# Patient Record
Sex: Female | Born: 1935 | Race: White | Hispanic: No | Smoking: Never smoker
Health system: Southern US, Community
[De-identification: ages and names within clinical notes are randomized; demographics above are authoritative.]

## PROBLEM LIST (undated history)

## (undated) DIAGNOSIS — I739 Peripheral vascular disease, unspecified: Secondary | ICD-10-CM

## (undated) DIAGNOSIS — G629 Polyneuropathy, unspecified: Secondary | ICD-10-CM

## (undated) DIAGNOSIS — I209 Angina pectoris, unspecified: Secondary | ICD-10-CM

## (undated) DIAGNOSIS — M259 Joint disorder, unspecified: Secondary | ICD-10-CM

## (undated) DIAGNOSIS — K219 Gastro-esophageal reflux disease without esophagitis: Secondary | ICD-10-CM

## (undated) DIAGNOSIS — E119 Type 2 diabetes mellitus without complications: Secondary | ICD-10-CM

## (undated) DIAGNOSIS — I2510 Atherosclerotic heart disease of native coronary artery without angina pectoris: Secondary | ICD-10-CM

## (undated) DIAGNOSIS — T4145XA Adverse effect of unspecified anesthetic, initial encounter: Secondary | ICD-10-CM

## (undated) DIAGNOSIS — J45909 Unspecified asthma, uncomplicated: Secondary | ICD-10-CM

## (undated) DIAGNOSIS — G4730 Sleep apnea, unspecified: Secondary | ICD-10-CM

## (undated) DIAGNOSIS — J309 Allergic rhinitis, unspecified: Secondary | ICD-10-CM

## (undated) DIAGNOSIS — Q639 Congenital malformation of kidney, unspecified: Secondary | ICD-10-CM

## (undated) DIAGNOSIS — I10 Essential (primary) hypertension: Secondary | ICD-10-CM

## (undated) DIAGNOSIS — E538 Deficiency of other specified B group vitamins: Secondary | ICD-10-CM

## (undated) DIAGNOSIS — C50919 Malignant neoplasm of unspecified site of unspecified female breast: Secondary | ICD-10-CM

## (undated) DIAGNOSIS — F329 Major depressive disorder, single episode, unspecified: Secondary | ICD-10-CM

## (undated) DIAGNOSIS — M1990 Unspecified osteoarthritis, unspecified site: Secondary | ICD-10-CM

## (undated) DIAGNOSIS — T8859XA Other complications of anesthesia, initial encounter: Secondary | ICD-10-CM

## (undated) DIAGNOSIS — H9190 Unspecified hearing loss, unspecified ear: Secondary | ICD-10-CM

## (undated) DIAGNOSIS — F32A Depression, unspecified: Secondary | ICD-10-CM

## (undated) DIAGNOSIS — I219 Acute myocardial infarction, unspecified: Secondary | ICD-10-CM

## (undated) DIAGNOSIS — R609 Edema, unspecified: Secondary | ICD-10-CM

## (undated) DIAGNOSIS — M539 Dorsopathy, unspecified: Secondary | ICD-10-CM

## (undated) HISTORY — PX: CORONARY ARTERY BYPASS GRAFT: SHX141

## (undated) HISTORY — PX: TOTAL KNEE ARTHROPLASTY: SHX125

## (undated) HISTORY — PX: CORONARY ANGIOPLASTY: SHX604

## (undated) HISTORY — PX: CHOLECYSTECTOMY: SHX55

## (undated) HISTORY — PX: APPENDECTOMY: SHX54

---

## 2007-10-29 ENCOUNTER — Encounter: Payer: Self-pay | Admitting: Orthopedic Surgery

## 2007-11-14 ENCOUNTER — Encounter: Payer: Self-pay | Admitting: Orthopedic Surgery

## 2008-12-08 ENCOUNTER — Encounter: Payer: Self-pay | Admitting: Orthopedic Surgery

## 2008-12-14 ENCOUNTER — Encounter: Payer: Self-pay | Admitting: Orthopedic Surgery

## 2009-01-14 ENCOUNTER — Encounter: Payer: Self-pay | Admitting: Orthopedic Surgery

## 2009-02-13 ENCOUNTER — Encounter: Payer: Self-pay | Admitting: Orthopedic Surgery

## 2009-06-22 ENCOUNTER — Ambulatory Visit: Payer: Self-pay | Admitting: Internal Medicine

## 2009-12-02 ENCOUNTER — Encounter: Payer: Self-pay | Admitting: Orthopedic Surgery

## 2009-12-14 ENCOUNTER — Encounter: Payer: Self-pay | Admitting: Orthopedic Surgery

## 2010-08-03 ENCOUNTER — Ambulatory Visit: Payer: Self-pay | Admitting: Family Medicine

## 2010-09-13 ENCOUNTER — Encounter: Payer: Self-pay | Admitting: Orthopedic Surgery

## 2010-09-14 ENCOUNTER — Encounter: Payer: Self-pay | Admitting: Orthopedic Surgery

## 2010-10-15 ENCOUNTER — Encounter: Payer: Self-pay | Admitting: Orthopedic Surgery

## 2010-10-21 ENCOUNTER — Ambulatory Visit: Payer: Self-pay | Admitting: Family Medicine

## 2012-09-14 ENCOUNTER — Ambulatory Visit: Payer: Self-pay | Admitting: Family Medicine

## 2012-09-28 ENCOUNTER — Ambulatory Visit: Payer: Self-pay | Admitting: Family Medicine

## 2012-10-15 ENCOUNTER — Ambulatory Visit: Payer: Self-pay | Admitting: Family

## 2013-04-18 ENCOUNTER — Emergency Department: Payer: Self-pay | Admitting: Emergency Medicine

## 2013-04-18 LAB — COMPREHENSIVE METABOLIC PANEL
Albumin: 3.5 g/dL (ref 3.4–5.0)
Alkaline Phosphatase: 114 U/L
Anion Gap: 6 — ABNORMAL LOW (ref 7–16)
BUN: 13 mg/dL (ref 7–18)
Calcium, Total: 9.3 mg/dL (ref 8.5–10.1)
Creatinine: 0.67 mg/dL (ref 0.60–1.30)
Glucose: 101 mg/dL — ABNORMAL HIGH (ref 65–99)
SGPT (ALT): 20 U/L (ref 12–78)
Sodium: 137 mmol/L (ref 136–145)

## 2013-04-18 LAB — URINALYSIS, COMPLETE
Blood: NEGATIVE
Specific Gravity: 1.012 (ref 1.003–1.030)
Squamous Epithelial: NONE SEEN

## 2013-04-18 LAB — CBC WITH DIFFERENTIAL/PLATELET
Basophil #: 0 10*3/uL (ref 0.0–0.1)
Basophil %: 0.6 %
Eosinophil %: 1 %
HCT: 37.4 % (ref 35.0–47.0)
Lymphocyte %: 19.2 %
MCH: 31.6 pg (ref 26.0–34.0)
MCHC: 34.2 g/dL (ref 32.0–36.0)
Monocyte #: 0.5 x10 3/mm (ref 0.2–0.9)
Monocyte %: 6.9 %
Neutrophil %: 72.3 %

## 2013-04-18 LAB — TROPONIN I: Troponin-I: <0.02 ng/mL

## 2013-04-18 LAB — CK TOTAL AND CKMB (NOT AT ARMC): CK-MB: 3.2 ng/mL (ref 0.5–3.6)

## 2013-04-23 LAB — CULTURE, BLOOD (SINGLE)

## 2013-06-10 ENCOUNTER — Ambulatory Visit: Payer: Self-pay | Admitting: Gastroenterology

## 2013-06-10 ENCOUNTER — Emergency Department: Payer: Self-pay | Admitting: Emergency Medicine

## 2013-06-10 LAB — CBC WITH DIFFERENTIAL/PLATELET
Basophil #: 0.1 10*3/uL (ref 0.0–0.1)
Basophil %: 0.7 %
Eosinophil #: 0.1 10*3/uL (ref 0.0–0.7)
Eosinophil %: 1.3 %
HCT: 40.2 % (ref 35.0–47.0)
HGB: 13.2 g/dL (ref 12.0–16.0)
LYMPHS ABS: 1.7 10*3/uL (ref 1.0–3.6)
Lymphocyte %: 19.6 %
MCH: 31.1 pg (ref 26.0–34.0)
MCHC: 32.9 g/dL (ref 32.0–36.0)
MCV: 94 fL (ref 80–100)
MONO ABS: 0.7 x10 3/mm (ref 0.2–0.9)
MONOS PCT: 8.2 %
Neutrophil #: 5.9 10*3/uL (ref 1.4–6.5)
Neutrophil %: 70.2 %
Platelet: 248 10*3/uL (ref 150–440)
RBC: 4.26 10*6/uL (ref 3.80–5.20)
RDW: 13 % (ref 11.5–14.5)
WBC: 8.4 10*3/uL (ref 3.6–11.0)

## 2013-06-10 LAB — BASIC METABOLIC PANEL
Anion Gap: 8 (ref 7–16)
BUN: 10 mg/dL (ref 7–18)
CHLORIDE: 102 mmol/L (ref 98–107)
Calcium, Total: 9.6 mg/dL (ref 8.5–10.1)
Co2: 27 mmol/L (ref 21–32)
Creatinine: 0.63 mg/dL (ref 0.60–1.30)
Glucose: 116 mg/dL — ABNORMAL HIGH (ref 65–99)
Osmolality: 274 (ref 275–301)
Potassium: 3.7 mmol/L (ref 3.5–5.1)
Sodium: 137 mmol/L (ref 136–145)

## 2013-06-10 LAB — TROPONIN I: Troponin-I: <0.02 ng/mL

## 2013-06-11 LAB — PATHOLOGY REPORT

## 2013-11-05 ENCOUNTER — Ambulatory Visit: Payer: Self-pay | Admitting: Family

## 2014-12-28 IMAGING — US US EXTREM LOW VENOUS BILAT
1 series · 14 of 24 positions shown · non-contrast
Comparison: none

REASON FOR EXAM: Bila Leg Pain Eval DVT
COMMENTS:

[Series 1: us extrem low venous bilat · 0.09mm/px · 14 of 43 slices shown]
[im 1/43]
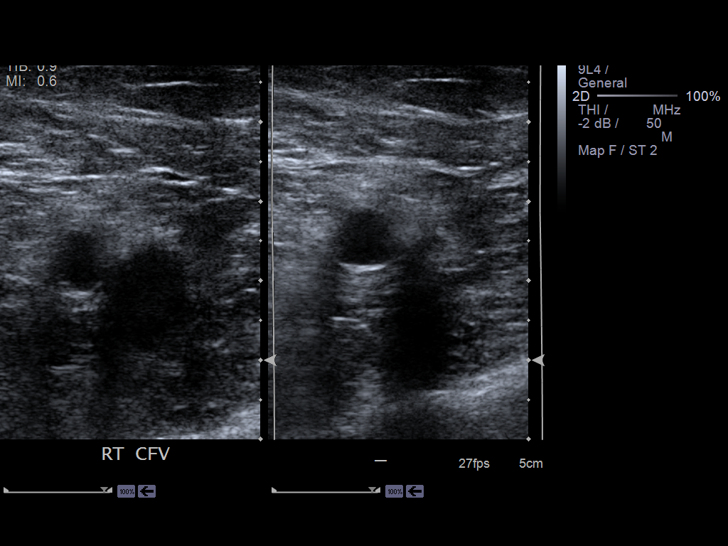
[im 4/43]
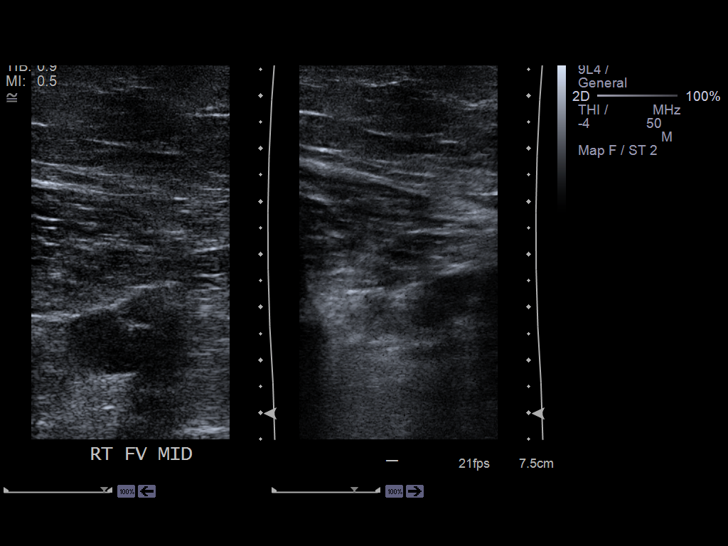
[im 8/43]
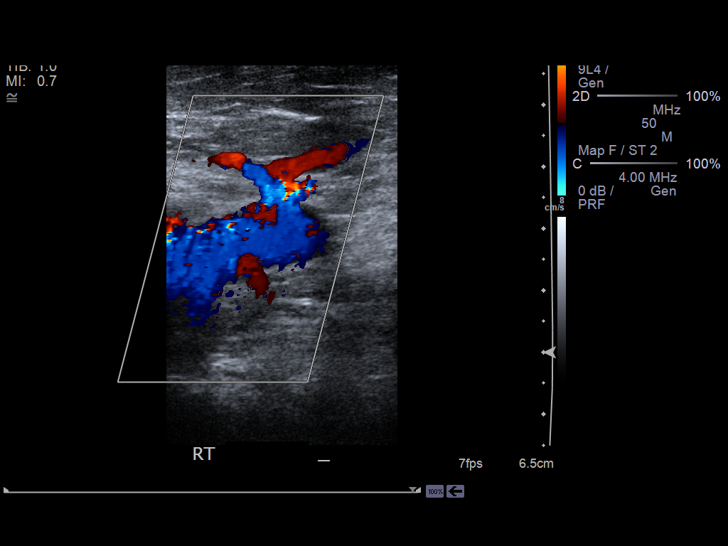
[im 11/43]
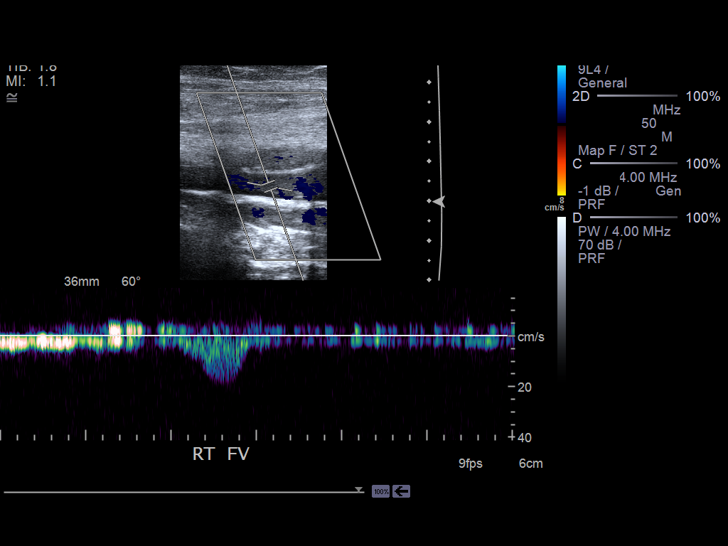
[im 13/43]
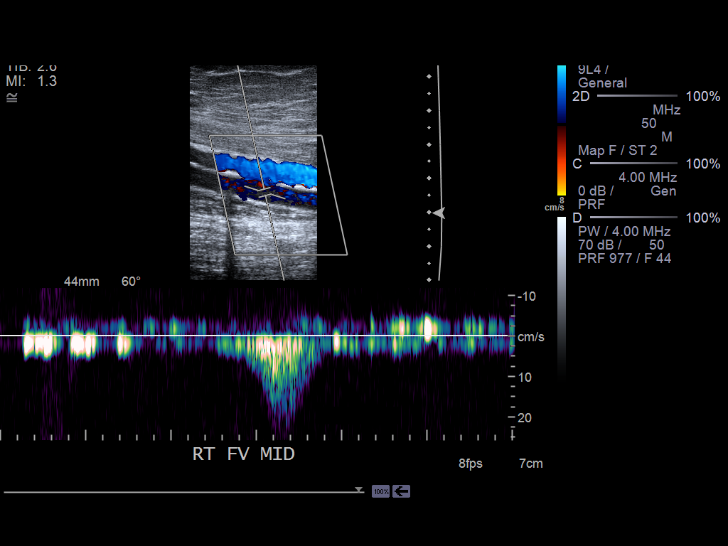
[im 17/43]
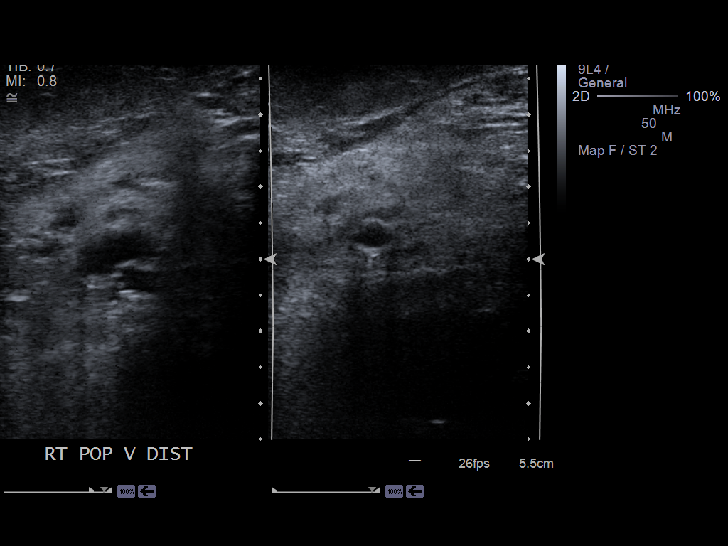
[im 21/43]
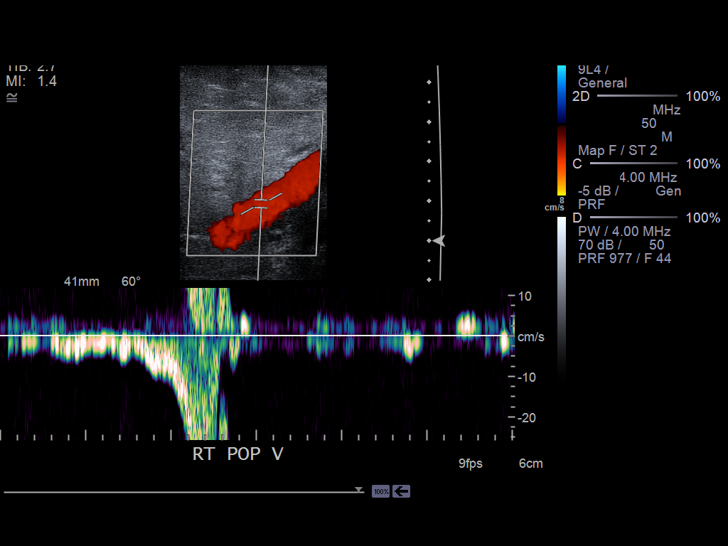
[im 22/43]
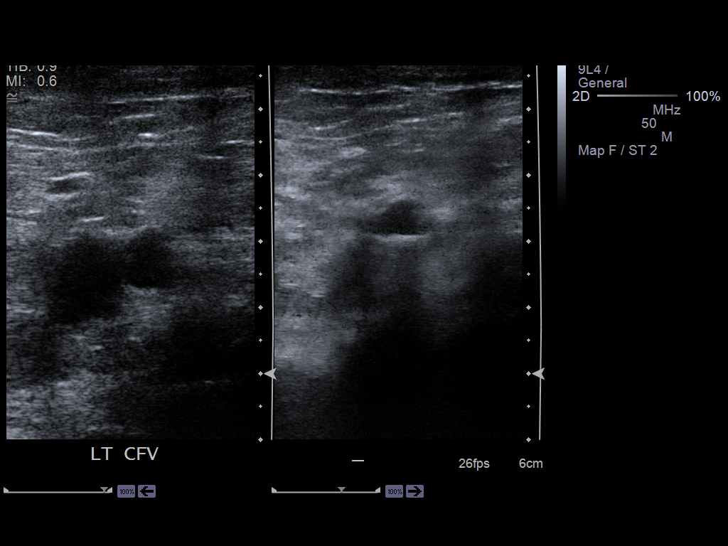
[im 26/43]
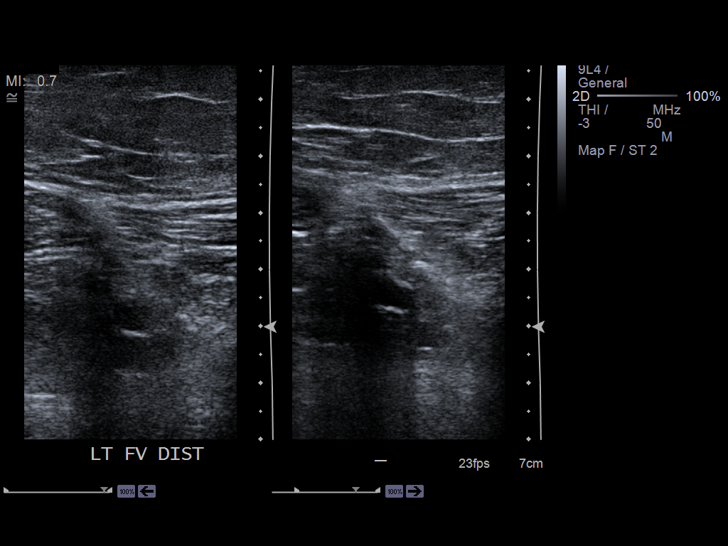
[im 30/43]
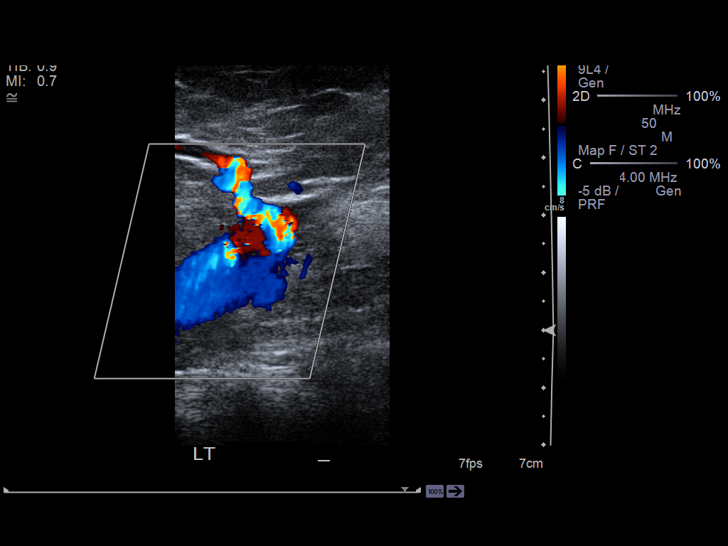
[im 33/43]
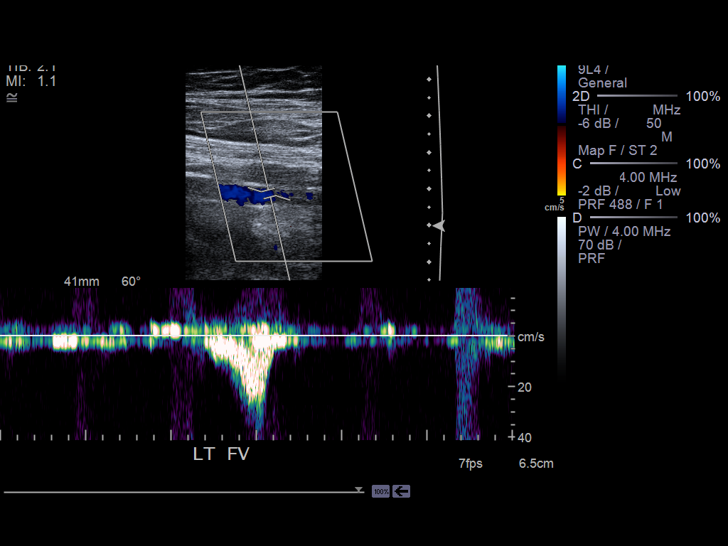
[im 35/43]
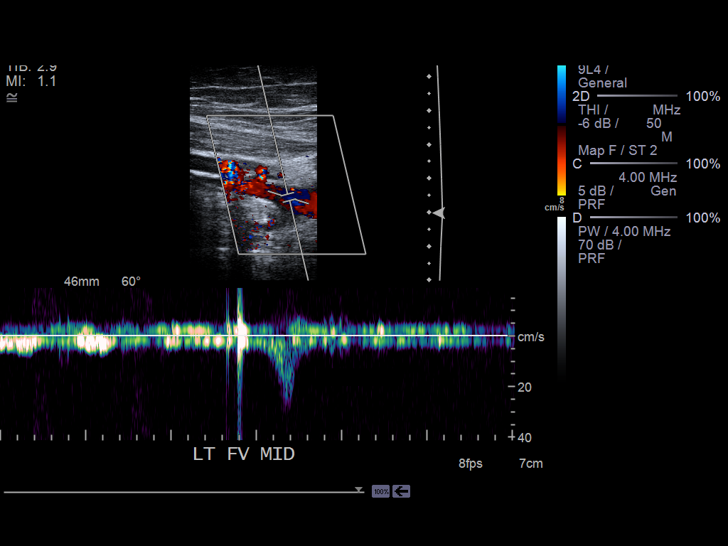
[im 39/43]
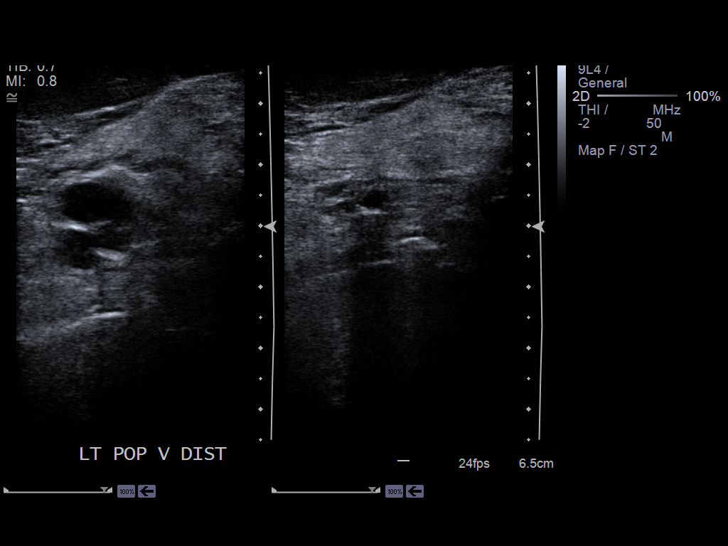
[im 43/43]
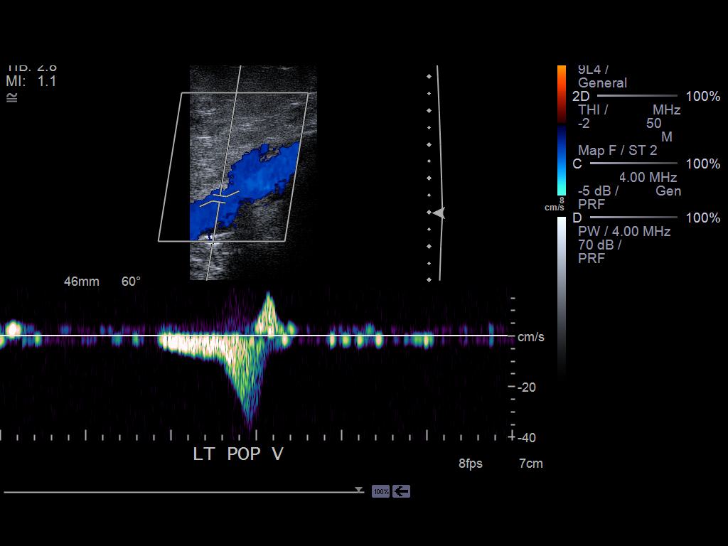

[14 of 24 positions shown; findings below may reference images not displayed]

PROCEDURE:     US  - US DOPPLER LOW EXTR BILATERAL  - September 14, 2012  [DATE]

RESULT:     Grayscale and color flow Doppler techniques were employed to
evaluate the deep veins in both lower extremities.

The right and left common femoral, superficial femoral, and popliteal veins
are normally compressible. The waveform patterns are normal and the color
flow images are normal. The response to the augmentation and Valsalva
maneuvers is normal.
IMPRESSION: There is no evidence of thrombus within the right or left
femoral or popliteal veins.

[REDACTED]

## 2015-12-01 ENCOUNTER — Encounter: Payer: Self-pay | Admitting: *Deleted

## 2015-12-06 ENCOUNTER — Ambulatory Visit: Payer: Medicare (Managed Care) | Admitting: Anesthesiology

## 2015-12-06 NOTE — H&P (Signed)
See scanned note.

## 2015-12-07 ENCOUNTER — Encounter: Payer: Self-pay | Admitting: *Deleted

## 2015-12-07 ENCOUNTER — Ambulatory Visit
Admission: RE | Admit: 2015-12-07 | Discharge: 2015-12-07 | Disposition: A | Discharge status: Home / Self Care | Payer: Medicare (Managed Care) | Source: Ambulatory Visit | Attending: Ophthalmology | Admitting: Ophthalmology

## 2015-12-07 DIAGNOSIS — E7800 Pure hypercholesterolemia, unspecified: Secondary | ICD-10-CM | POA: Diagnosis not present

## 2015-12-07 DIAGNOSIS — Z882 Allergy status to sulfonamides status: Secondary | ICD-10-CM | POA: Diagnosis not present

## 2015-12-07 DIAGNOSIS — E538 Deficiency of other specified B group vitamins: Secondary | ICD-10-CM | POA: Insufficient documentation

## 2015-12-07 DIAGNOSIS — K219 Gastro-esophageal reflux disease without esophagitis: Secondary | ICD-10-CM | POA: Diagnosis not present

## 2015-12-07 DIAGNOSIS — Z993 Dependence on wheelchair: Secondary | ICD-10-CM | POA: Diagnosis not present

## 2015-12-07 DIAGNOSIS — R011 Cardiac murmur, unspecified: Secondary | ICD-10-CM | POA: Insufficient documentation

## 2015-12-07 DIAGNOSIS — I10 Essential (primary) hypertension: Secondary | ICD-10-CM | POA: Diagnosis not present

## 2015-12-07 DIAGNOSIS — M7989 Other specified soft tissue disorders: Secondary | ICD-10-CM | POA: Diagnosis not present

## 2015-12-07 DIAGNOSIS — Z955 Presence of coronary angioplasty implant and graft: Secondary | ICD-10-CM | POA: Insufficient documentation

## 2015-12-07 DIAGNOSIS — E669 Obesity, unspecified: Secondary | ICD-10-CM | POA: Insufficient documentation

## 2015-12-07 DIAGNOSIS — I739 Peripheral vascular disease, unspecified: Secondary | ICD-10-CM | POA: Insufficient documentation

## 2015-12-07 DIAGNOSIS — M1990 Unspecified osteoarthritis, unspecified site: Secondary | ICD-10-CM | POA: Insufficient documentation

## 2015-12-07 DIAGNOSIS — Z9049 Acquired absence of other specified parts of digestive tract: Secondary | ICD-10-CM | POA: Insufficient documentation

## 2015-12-07 DIAGNOSIS — F329 Major depressive disorder, single episode, unspecified: Secondary | ICD-10-CM | POA: Insufficient documentation

## 2015-12-07 DIAGNOSIS — Z951 Presence of aortocoronary bypass graft: Secondary | ICD-10-CM | POA: Insufficient documentation

## 2015-12-07 DIAGNOSIS — M4800 Spinal stenosis, site unspecified: Secondary | ICD-10-CM | POA: Insufficient documentation

## 2015-12-07 DIAGNOSIS — Z539 Procedure and treatment not carried out, unspecified reason: Secondary | ICD-10-CM | POA: Diagnosis not present

## 2015-12-07 DIAGNOSIS — E1136 Type 2 diabetes mellitus with diabetic cataract: Secondary | ICD-10-CM | POA: Insufficient documentation

## 2015-12-07 DIAGNOSIS — I2510 Atherosclerotic heart disease of native coronary artery without angina pectoris: Secondary | ICD-10-CM | POA: Insufficient documentation

## 2015-12-07 DIAGNOSIS — I252 Old myocardial infarction: Secondary | ICD-10-CM | POA: Insufficient documentation

## 2015-12-07 DIAGNOSIS — Z888 Allergy status to other drugs, medicaments and biological substances status: Secondary | ICD-10-CM | POA: Diagnosis not present

## 2015-12-07 DIAGNOSIS — H2512 Age-related nuclear cataract, left eye: Secondary | ICD-10-CM | POA: Diagnosis present

## 2015-12-07 DIAGNOSIS — Z6841 Body Mass Index (BMI) 40.0 and over, adult: Secondary | ICD-10-CM | POA: Insufficient documentation

## 2015-12-07 HISTORY — DX: Other complications of anesthesia, initial encounter: T88.59XA

## 2015-12-07 HISTORY — DX: Acute myocardial infarction, unspecified: I21.9

## 2015-12-07 HISTORY — DX: Edema, unspecified: R60.9

## 2015-12-07 HISTORY — DX: Atherosclerotic heart disease of native coronary artery without angina pectoris: I25.10

## 2015-12-07 HISTORY — DX: Deficiency of other specified B group vitamins: E53.8

## 2015-12-07 HISTORY — DX: Peripheral vascular disease, unspecified: I73.9

## 2015-12-07 HISTORY — DX: Angina pectoris, unspecified: I20.9

## 2015-12-07 HISTORY — DX: Dorsopathy, unspecified: M53.9

## 2015-12-07 HISTORY — DX: Allergic rhinitis, unspecified: J30.9

## 2015-12-07 HISTORY — DX: Unspecified asthma, uncomplicated: J45.909

## 2015-12-07 HISTORY — DX: Gastro-esophageal reflux disease without esophagitis: K21.9

## 2015-12-07 HISTORY — DX: Congenital malformation of kidney, unspecified: Q63.9

## 2015-12-07 HISTORY — DX: Depression, unspecified: F32.A

## 2015-12-07 HISTORY — DX: Type 2 diabetes mellitus without complications: E11.9

## 2015-12-07 HISTORY — DX: Polyneuropathy, unspecified: G62.9

## 2015-12-07 HISTORY — DX: Unspecified osteoarthritis, unspecified site: M19.90

## 2015-12-07 HISTORY — DX: Essential (primary) hypertension: I10

## 2015-12-07 HISTORY — DX: Joint disorder, unspecified: M25.9

## 2015-12-07 HISTORY — DX: Unspecified hearing loss, unspecified ear: H91.90

## 2015-12-07 HISTORY — DX: Sleep apnea, unspecified: G47.30

## 2015-12-07 HISTORY — DX: Major depressive disorder, single episode, unspecified: F32.9

## 2015-12-07 HISTORY — DX: Adverse effect of unspecified anesthetic, initial encounter: T41.45XA

## 2015-12-07 LAB — GLUCOSE, CAPILLARY: GLUCOSE-CAPILLARY: 87 mg/dL (ref 65–99)

## 2015-12-07 MED ORDER — CYCLOPENTOLATE HCL 2 % OP SOLN
1.0000 [drp] | OPHTHALMIC | Status: DC | PRN
  Administered 2015-12-07 (×2): 1 [drp] via OPHTHALMIC

## 2015-12-07 MED ORDER — CYCLOPENTOLATE HCL 2 % OP SOLN
OPHTHALMIC | Status: AC
  Administered 2015-12-07: 1 [drp] via OPHTHALMIC
  Filled 2015-12-07: qty 2

## 2015-12-07 MED ORDER — MOXIFLOXACIN HCL 0.5 % OP SOLN
1.0000 [drp] | OPHTHALMIC | Status: AC | PRN
  Administered 2015-12-07 (×2): 1 [drp] via OPHTHALMIC

## 2015-12-07 MED ORDER — PHENYLEPHRINE HCL 10 % OP SOLN
OPHTHALMIC | Status: AC
  Administered 2015-12-07: 1 [drp] via OPHTHALMIC
  Filled 2015-12-07: qty 5

## 2015-12-07 MED ORDER — PHENYLEPHRINE HCL 10 % OP SOLN
1.0000 [drp] | OPHTHALMIC | Status: DC | PRN
  Administered 2015-12-07 (×2): 1 [drp] via OPHTHALMIC

## 2015-12-07 MED ORDER — SODIUM CHLORIDE 0.9 % IV SOLN
INTRAVENOUS | Status: DC
  Administered 2015-12-07: 07:00:00 via INTRAVENOUS

## 2015-12-07 MED ORDER — MOXIFLOXACIN HCL 0.5 % OP SOLN
OPHTHALMIC | Status: AC
  Administered 2015-12-07: 1 [drp] via OPHTHALMIC
  Filled 2015-12-07: qty 3

## 2015-12-07 NOTE — Anesthesia Preprocedure Evaluation (Deleted)
Anesthesia Evaluation  Patient identified by MRN, date of birth, ID band Patient awake    Reviewed: Allergy & Precautions, NPO status , Patient's Chart, lab work & pertinent test results  History of Anesthesia Complications (+) PROLONGED EMERGENCE and history of anesthetic complications  Airway Mallampati: II  TM Distance: >3 FB Neck ROM: Full    Dental  (+) Edentulous Upper, Edentulous Lower   Pulmonary asthma ,    breath sounds clear to auscultation- rhonchi (-) wheezing      Cardiovascular Exercise Tolerance: Poor hypertension, Pt. on medications + CAD, + Past MI, + CABG and + Peripheral Vascular Disease   Rhythm:Regular Rate:Normal - Systolic murmurs and - Diastolic murmurs Wheelchair bound    Neuro/Psych PSYCHIATRIC DISORDERS Depression    GI/Hepatic Neg liver ROS, GERD  ,  Endo/Other  diabetes, Well Controlled, Type 2Borderline   Renal/GU negative Renal ROS     Musculoskeletal  (+) Arthritis , Osteoarthritis,    Abdominal (+) + obese,   Peds  Hematology negative hematology ROS (+)   Anesthesia Other Findings Past Medical History: No date: Anginal pain (HCC)     Comment: STABLE No date: Arthritis No date: Asthma     Comment: CHRONIC OBSTRUCTIVE No date: B12 deficiency No date: Complication of anesthesia     Comment: SLOW TO WAKE UP No date: Coronary artery disease No date: Depression     Comment: MAJOR No date: Diabetes mellitus without complication (HCC) No date: Edema     Comment: HANDS/FEET No date: GERD (gastroesophageal reflux disease) No date: HOH (hard of hearing) No date: Hypertension No date: Myocardial infarction ([place]) No date: Peripheral vascular disease (HCC) No date: Polyneuropathy ([place]) No date: Renal anomaly     Comment: ARTERY STENOSIS No date: Rhinitis, allergic     Comment: CHRONIC No date: Shoulder disorder     Comment: ROTATOR CUFF TEAR  LEFT No date: Spinal disorder    Comment: STENOSIS   Reproductive/Obstetrics                             Anesthesia Physical Anesthesia Plan  ASA: III  Anesthesia Plan: MAC   Post-op Pain Management:    Induction:   Airway Management Planned:   Additional Equipment:   Intra-op Plan:   Post-operative Plan:   Informed Consent: I have reviewed the patients History and Physical, chart, labs and discussed the procedure including the risks, benefits and alternatives for the proposed anesthesia with the patient or authorized representative who has indicated his/her understanding and acceptance.     Plan Discussed with: CRNA and Anesthesiologist  Anesthesia Plan Comments:         Anesthesia Quick Evaluation

## 2015-12-09 ENCOUNTER — Ambulatory Visit: Payer: Medicare (Managed Care) | Admitting: Anesthesiology

## 2015-12-09 ENCOUNTER — Encounter: Payer: Self-pay | Admitting: *Deleted

## 2015-12-09 ENCOUNTER — Encounter
Admission: RE | Disposition: A | Discharge status: Home / Self Care | Payer: Self-pay | Source: Ambulatory Visit | Attending: Ophthalmology

## 2015-12-09 ENCOUNTER — Ambulatory Visit
Admission: RE | Admit: 2015-12-09 | Discharge: 2015-12-09 | Disposition: A | Discharge status: Home / Self Care | Payer: Medicare (Managed Care) | Source: Ambulatory Visit | Attending: Ophthalmology | Admitting: Ophthalmology

## 2015-12-09 DIAGNOSIS — I10 Essential (primary) hypertension: Secondary | ICD-10-CM | POA: Insufficient documentation

## 2015-12-09 DIAGNOSIS — Z882 Allergy status to sulfonamides status: Secondary | ICD-10-CM | POA: Insufficient documentation

## 2015-12-09 DIAGNOSIS — Z9049 Acquired absence of other specified parts of digestive tract: Secondary | ICD-10-CM | POA: Insufficient documentation

## 2015-12-09 DIAGNOSIS — I2510 Atherosclerotic heart disease of native coronary artery without angina pectoris: Secondary | ICD-10-CM | POA: Diagnosis not present

## 2015-12-09 DIAGNOSIS — Z951 Presence of aortocoronary bypass graft: Secondary | ICD-10-CM | POA: Insufficient documentation

## 2015-12-09 DIAGNOSIS — H2512 Age-related nuclear cataract, left eye: Secondary | ICD-10-CM | POA: Diagnosis present

## 2015-12-09 DIAGNOSIS — K219 Gastro-esophageal reflux disease without esophagitis: Secondary | ICD-10-CM | POA: Diagnosis not present

## 2015-12-09 DIAGNOSIS — J449 Chronic obstructive pulmonary disease, unspecified: Secondary | ICD-10-CM | POA: Insufficient documentation

## 2015-12-09 DIAGNOSIS — I739 Peripheral vascular disease, unspecified: Secondary | ICD-10-CM | POA: Diagnosis not present

## 2015-12-09 DIAGNOSIS — E119 Type 2 diabetes mellitus without complications: Secondary | ICD-10-CM | POA: Insufficient documentation

## 2015-12-09 DIAGNOSIS — E7800 Pure hypercholesterolemia, unspecified: Secondary | ICD-10-CM | POA: Diagnosis not present

## 2015-12-09 DIAGNOSIS — F329 Major depressive disorder, single episode, unspecified: Secondary | ICD-10-CM | POA: Insufficient documentation

## 2015-12-09 DIAGNOSIS — I252 Old myocardial infarction: Secondary | ICD-10-CM | POA: Diagnosis not present

## 2015-12-09 DIAGNOSIS — Z955 Presence of coronary angioplasty implant and graft: Secondary | ICD-10-CM | POA: Insufficient documentation

## 2015-12-09 DIAGNOSIS — M1990 Unspecified osteoarthritis, unspecified site: Secondary | ICD-10-CM | POA: Insufficient documentation

## 2015-12-09 DIAGNOSIS — Z888 Allergy status to other drugs, medicaments and biological substances status: Secondary | ICD-10-CM | POA: Insufficient documentation

## 2015-12-09 HISTORY — PX: CATARACT EXTRACTION W/PHACO: SHX586

## 2015-12-09 LAB — GLUCOSE, CAPILLARY: GLUCOSE-CAPILLARY: 85 mg/dL (ref 65–99)

## 2015-12-09 SURGERY — PHACOEMULSIFICATION, CATARACT, WITH IOL INSERTION
Anesthesia: General | Site: Eye | Laterality: Left | Wound class: Clean

## 2015-12-09 SURGERY — PHACOEMULSIFICATION, CATARACT, WITH IOL INSERTION
Anesthesia: Monitor Anesthesia Care | Site: Eye | Laterality: Left | Wound class: Clean

## 2015-12-09 MED ORDER — TETRACAINE HCL 0.5 % OP SOLN
OPHTHALMIC | Status: AC
  Filled 2015-12-09: qty 2

## 2015-12-09 MED ORDER — LIDOCAINE HCL (PF) 4 % IJ SOLN
INTRAMUSCULAR | Status: DC | PRN
  Administered 2015-12-09: 4 mL via OPHTHALMIC

## 2015-12-09 MED ORDER — LIDOCAINE HCL (PF) 4 % IJ SOLN
INTRAMUSCULAR | Status: AC
  Filled 2015-12-09: qty 5

## 2015-12-09 MED ORDER — MOXIFLOXACIN HCL 0.5 % OP SOLN
OPHTHALMIC | Status: DC | PRN
  Administered 2015-12-09: 1 [drp] via OPHTHALMIC

## 2015-12-09 MED ORDER — HYALURONIDASE HUMAN 150 UNIT/ML IJ SOLN
INTRAMUSCULAR | Status: AC
  Filled 2015-12-09: qty 1

## 2015-12-09 MED ORDER — BUPIVACAINE HCL (PF) 0.75 % IJ SOLN
INTRAMUSCULAR | Status: AC
  Filled 2015-12-09: qty 10

## 2015-12-09 MED ORDER — SODIUM CHLORIDE 0.9 % IV SOLN
INTRAVENOUS | Status: DC
  Administered 2015-12-09: 09:00:00 via INTRAVENOUS

## 2015-12-09 MED ORDER — ALFENTANIL 500 MCG/ML IJ INJ
INJECTION | INTRAMUSCULAR | Status: DC | PRN
  Administered 2015-12-09: 500 ug via INTRAVENOUS

## 2015-12-09 MED ORDER — MOXIFLOXACIN HCL 0.5 % OP SOLN
1.0000 [drp] | OPHTHALMIC | Status: AC
  Administered 2015-12-09 (×3): 1 [drp] via OPHTHALMIC

## 2015-12-09 MED ORDER — BSS IO SOLN
INTRAOCULAR | Status: DC | PRN
  Administered 2015-12-09: .5 mL via OPHTHALMIC

## 2015-12-09 MED ORDER — POVIDONE-IODINE 5 % OP SOLN
OPHTHALMIC | Status: AC
  Filled 2015-12-09: qty 30

## 2015-12-09 MED ORDER — CARBACHOL 0.01 % IO SOLN
INTRAOCULAR | Status: DC | PRN
  Administered 2015-12-09: .5 mL via INTRAOCULAR

## 2015-12-09 MED ORDER — CYCLOPENTOLATE HCL 2 % OP SOLN
OPHTHALMIC | Status: AC
  Administered 2015-12-09: 1 [drp] via OPHTHALMIC
  Filled 2015-12-09: qty 2

## 2015-12-09 MED ORDER — POVIDONE-IODINE 5 % OP SOLN
OPHTHALMIC | Status: DC | PRN
  Administered 2015-12-09: 1 via OPHTHALMIC

## 2015-12-09 MED ORDER — CYCLOPENTOLATE HCL 2 % OP SOLN
1.0000 [drp] | OPHTHALMIC | Status: AC
  Administered 2015-12-09 (×4): 1 [drp] via OPHTHALMIC

## 2015-12-09 MED ORDER — PHENYLEPHRINE HCL 10 % OP SOLN
OPHTHALMIC | Status: AC
  Administered 2015-12-09: 1 [drp] via OPHTHALMIC
  Filled 2015-12-09: qty 5

## 2015-12-09 MED ORDER — TETRACAINE HCL 0.5 % OP SOLN
OPHTHALMIC | Status: DC | PRN
  Administered 2015-12-09: 1 [drp] via OPHTHALMIC

## 2015-12-09 MED ORDER — MIDAZOLAM HCL 2 MG/2ML IJ SOLN
INTRAMUSCULAR | Status: DC | PRN
  Administered 2015-12-09: 1 mg via INTRAVENOUS

## 2015-12-09 MED ORDER — EPINEPHRINE HCL 1 MG/ML IJ SOLN
INTRAMUSCULAR | Status: AC
  Filled 2015-12-09: qty 2

## 2015-12-09 MED ORDER — ONDANSETRON HCL 4 MG/2ML IJ SOLN
INTRAMUSCULAR | Status: DC | PRN
  Administered 2015-12-09: 4 mg via INTRAVENOUS

## 2015-12-09 MED ORDER — CEFUROXIME OPHTHALMIC INJECTION 1 MG/0.1 ML
INJECTION | OPHTHALMIC | Status: AC
  Filled 2015-12-09: qty 0.1

## 2015-12-09 MED ORDER — CEFUROXIME OPHTHALMIC INJECTION 1 MG/0.1 ML
INJECTION | OPHTHALMIC | Status: DC | PRN
  Administered 2015-12-09: .1 mL via INTRACAMERAL

## 2015-12-09 MED ORDER — NA CHONDROIT SULF-NA HYALURON 40-17 MG/ML IO SOLN
INTRAOCULAR | Status: DC | PRN
  Administered 2015-12-09: 1 mL via INTRAOCULAR

## 2015-12-09 MED ORDER — BSS IO SOLN
INTRAOCULAR | Status: DC | PRN
  Administered 2015-12-09: 1 mL via OPHTHALMIC

## 2015-12-09 MED ORDER — NA CHONDROIT SULF-NA HYALURON 40-17 MG/ML IO SOLN
INTRAOCULAR | Status: AC
  Filled 2015-12-09: qty 1

## 2015-12-09 MED ORDER — PHENYLEPHRINE HCL 10 % OP SOLN
1.0000 [drp] | OPHTHALMIC | Status: AC
  Administered 2015-12-09 (×4): 1 [drp] via OPHTHALMIC

## 2015-12-09 MED ORDER — MOXIFLOXACIN HCL 0.5 % OP SOLN
OPHTHALMIC | Status: AC
  Administered 2015-12-09: 1 [drp] via OPHTHALMIC
  Filled 2015-12-09: qty 3

## 2015-12-09 SURGICAL SUPPLY — 30 items
CANNULA ANT/CHMB 27GA (MISCELLANEOUS) ×3 IMPLANT
CORD BIP STRL DISP 12FT (MISCELLANEOUS) ×3 IMPLANT
CUP MEDICINE 2OZ PLAST GRAD ST (MISCELLANEOUS) ×3 IMPLANT
DRAPE XRAY CASSETTE 23X24 (DRAPES) ×3 IMPLANT
ERASER HMR WETFIELD 18G (MISCELLANEOUS) ×3 IMPLANT
GLOVE BIO SURGEON STRL SZ8 (GLOVE) ×3 IMPLANT
GLOVE SURG LX 6.5 MICRO (GLOVE) ×2
GLOVE SURG LX 8.0 MICRO (GLOVE) ×2
GLOVE SURG LX STRL 6.5 MICRO (GLOVE) ×1 IMPLANT
GLOVE SURG LX STRL 8.0 MICRO (GLOVE) ×1 IMPLANT
GOWN STRL REUS W/ TWL LRG LVL3 (GOWN DISPOSABLE) ×1 IMPLANT
GOWN STRL REUS W/ TWL XL LVL3 (GOWN DISPOSABLE) ×1 IMPLANT
GOWN STRL REUS W/TWL LRG LVL3 (GOWN DISPOSABLE) ×2
GOWN STRL REUS W/TWL XL LVL3 (GOWN DISPOSABLE) ×2
LENS IOL ACRSF IQ ULTRA 23.0 (Intraocular Lens) ×1 IMPLANT
LENS IOL ACRYSOF IQ 23.0 (Intraocular Lens) ×3 IMPLANT
PACK CATARACT (MISCELLANEOUS) ×3 IMPLANT
PACK CATARACT DINGLEDEIN LX (MISCELLANEOUS) ×3 IMPLANT
PACK EYE AFTER SURG (MISCELLANEOUS) ×3 IMPLANT
SHLD EYE VISITEC  UNIV (MISCELLANEOUS) ×3 IMPLANT
SOL BSS BAG (MISCELLANEOUS) ×3
SOL PREP PVP 2OZ (MISCELLANEOUS) ×3
SOLUTION BSS BAG (MISCELLANEOUS) ×1 IMPLANT
SOLUTION PREP PVP 2OZ (MISCELLANEOUS) ×1 IMPLANT
SUT SILK 5-0 (SUTURE) ×3 IMPLANT
SYR 3ML LL SCALE MARK (SYRINGE) ×3 IMPLANT
SYR 5ML LL (SYRINGE) ×3 IMPLANT
SYR TB 1ML 27GX1/2 LL (SYRINGE) ×3 IMPLANT
WATER STERILE IRR 1000ML POUR (IV SOLUTION) ×3 IMPLANT
WIPE NON LINTING 3.25X3.25 (MISCELLANEOUS) ×3 IMPLANT

## 2015-12-09 SURGICAL SUPPLY — 28 items
CANNULA ANT/CHMB 27GA (MISCELLANEOUS) ×3 IMPLANT
CORD BIP STRL DISP 12FT (MISCELLANEOUS) ×3 IMPLANT
CUP MEDICINE 2OZ PLAST GRAD ST (MISCELLANEOUS) ×3 IMPLANT
DRAPE XRAY CASSETTE 23X24 (DRAPES) ×3 IMPLANT
ERASER HMR WETFIELD 18G (MISCELLANEOUS) ×3 IMPLANT
GLOVE BIO SURGEON STRL SZ8 (GLOVE) ×3 IMPLANT
GLOVE SURG LX 6.5 MICRO (GLOVE) ×2
GLOVE SURG LX 8.0 MICRO (GLOVE) ×2
GLOVE SURG LX STRL 6.5 MICRO (GLOVE) ×1 IMPLANT
GLOVE SURG LX STRL 8.0 MICRO (GLOVE) ×1 IMPLANT
GOWN STRL REUS W/ TWL LRG LVL3 (GOWN DISPOSABLE) ×1 IMPLANT
GOWN STRL REUS W/ TWL XL LVL3 (GOWN DISPOSABLE) ×1 IMPLANT
GOWN STRL REUS W/TWL LRG LVL3 (GOWN DISPOSABLE) ×2
GOWN STRL REUS W/TWL XL LVL3 (GOWN DISPOSABLE) ×2
PACK CATARACT (MISCELLANEOUS) IMPLANT
PACK CATARACT DINGLEDEIN LX (MISCELLANEOUS) IMPLANT
PACK EYE AFTER SURG (MISCELLANEOUS) IMPLANT
SHLD EYE VISITEC  UNIV (MISCELLANEOUS) ×3 IMPLANT
SOL BSS BAG (MISCELLANEOUS) ×3
SOL PREP PVP 2OZ (MISCELLANEOUS) ×3
SOLUTION BSS BAG (MISCELLANEOUS) ×1 IMPLANT
SOLUTION PREP PVP 2OZ (MISCELLANEOUS) ×1 IMPLANT
SUT SILK 5-0 (SUTURE) IMPLANT
SYR 3ML LL SCALE MARK (SYRINGE) ×3 IMPLANT
SYR 5ML LL (SYRINGE) ×3 IMPLANT
SYR TB 1ML 27GX1/2 LL (SYRINGE) ×3 IMPLANT
WATER STERILE IRR 1000ML POUR (IV SOLUTION) ×3 IMPLANT
WIPE NON LINTING 3.25X3.25 (MISCELLANEOUS) ×3 IMPLANT

## 2015-12-09 NOTE — Discharge Instructions (Addendum)
Eye Surgery Discharge Instructions  Expect mild scratchy sensation or mild soreness. DO NOT RUB YOUR EYE!  The day of surgery:  Minimal physical activity, but bed rest is not required  No reading, computer work, or close hand work  No bending, lifting, or straining.  May watch TV  For 24 hours:  No driving, legal decisions, or alcoholic beverages  Safety precautions  Eat anything you prefer: It is better to start with liquids, then soup then solid foods.  _____ Eye patch should be worn until postoperative exam tomorrow.  ____ Solar shield eyeglasses should be worn for comfort in the sunlight/patch while sleeping  Resume all regular medications including aspirin or Coumadin if these were discontinued prior to surgery. You may shower, bathe, shave, or wash your hair. Tylenol may be taken for mild discomfort.  Call your doctor if you experience significant pain, nausea, or vomiting, fever > 101 or other signs of infection. [phone] or [phone] Specific instructions:  Follow-up Information    DINGELDEIN,STEVEN, MD .   Specialty:  Ophthalmology Why:  12-10-15 at 10:35 Contact information: [place] [address] [phone]          Eye Surgery Discharge Instructions  Expect mild scratchy sensation or mild soreness. DO NOT RUB YOUR EYE!  The day of surgery:  Minimal physical activity, but bed rest is not required  No reading, computer work, or close hand work  No bending, lifting, or straining.  May watch TV  For 24 hours:  No driving, legal decisions, or alcoholic beverages  Safety precautions  Eat anything you prefer: It is better to start with liquids, then soup then solid foods.  _____ Eye patch should be worn until postoperative exam tomorrow.  ____ Solar shield eyeglasses should be worn for comfort in the sunlight/patch while sleeping  Resume all regular medications including aspirin or Coumadin if these were  discontinued prior to surgery. You may shower, bathe, shave, or wash your hair. Tylenol may be taken for mild discomfort.  Call your doctor if you experience significant pain, nausea, or vomiting, fever > 101 or other signs of infection. [phone] or [phone] Specific instructions:  Follow-up Information    DINGELDEIN,STEVEN, MD .   Specialty:  Ophthalmology Why:  12-10-15 at 10:35 Contact information: [address]   [place] [address] [phone]         See handout

## 2015-12-09 NOTE — Interval H&P Note (Signed)
History and Physical Interval Note:  12/09/2015 [id number] AM  Jeanette White  has presented today for surgery, with the diagnosis of CATARACT  The various methods of treatment have been discussed with the patient and family. After consideration of risks, benefits and other options for treatment, the patient has consented to  Procedure(s): CATARACT EXTRACTION PHACO AND INTRAOCULAR LENS PLACEMENT ([place]) (Left) as a surgical intervention .  The patient's history has been reviewed, patient examined, no change in status, stable for surgery.  I have reviewed the patient's chart and labs.  Questions were answered to the patient's satisfaction.     Mihcael Ledee

## 2015-12-09 NOTE — Transfer of Care (Signed)
Immediate Anesthesia Transfer of Care Note  Patient: Jeanette White  Procedure(s) Performed: Procedure(s) with comments: CATARACT EXTRACTION PHACO AND INTRAOCULAR LENS PLACEMENT ([facility]) (Left) - [place] 01:21 AP% 22.8 CDE 31.49 fluid pack lot # [id number]  Patient Location: PHASE II  Anesthesia Type:MAC  Level of Consciousness: Awake, Alert, Oriented  Airway & Oxygen Therapy: Patient Spontanous Breathing and Patient on room air   Post-op Assessment: Report given to RN and Post -op Vital signs reviewed and stable  Post vital signs: Reviewed and stable  Last Vitals:  Vitals:   12/09/15 0912 12/09/15 1220  BP: (!) 153/60 (!) 150/66  Pulse: 66 69  Resp: 18 16  Temp: 36.5 C (!) [id number]    Complications: No apparent anesthesia complications

## 2015-12-09 NOTE — Anesthesia Postprocedure Evaluation (Signed)
Anesthesia Post Note  Patient: Jeanette White  Procedure(s) Performed: Procedure(s) (LRB): CATARACT EXTRACTION PHACO AND INTRAOCULAR LENS PLACEMENT ([facility]) (Left)  Patient location during evaluation: Other Anesthesia Type: General Level of consciousness: awake and alert Pain management: pain level controlled Vital Signs Assessment: post-procedure vital signs reviewed and stable Respiratory status: spontaneous breathing Cardiovascular status: blood pressure returned to baseline Postop Assessment: no headache, no backache and no signs of nausea or vomiting    Last Vitals:  Vitals:   12/09/15 0912 12/09/15 1220  BP: (!) 153/60 (!) 150/66  Pulse: 66 69  Resp: 18 16  Temp: 36.5 C (!) 35.9 C    Last Pain:  Vitals:   12/09/15 1220  TempSrc: Tympanic                 Alison Stalling

## 2015-12-09 NOTE — H&P (View-Only) (Signed)
See scanned note.

## 2015-12-09 NOTE — Op Note (Signed)
Date of Surgery: 12/09/2015 Date of Dictation: 12/09/2015 12:18 PM Pre-operative Diagnosis:  Nuclear Sclerotic Cataract left Eye Post-operative Diagnosis: same Procedure performed: Extra-capsular Cataract Extraction (ECCE) with placement of a posterior chamber intraocular lens (IOL) left Eye IOL:  Implant Name Type Inv. Item Serial No. Manufacturer Lot No. LRB No. Used  LENS IOL ACRYSOF IQ 23.0 - [id number] Intraocular Lens LENS IOL ACRYSOF IQ 23.0 [id number] ALCON [id number] Left 1  LENS IOL ACRYSOF IQ 23.0 - [id number] Intraocular Lens LENS IOL ACRYSOF IQ 23.0 [id number] ALCON   Left 1   Anesthesia: 2% Lidocaine and 4% Marcaine in a 50/50 mixture with 10 unites/ml of Hylenex given as a peribulbar Anesthesiologist: Anesthesiologist: Molli Barrows, MD CRNA: Doreen Salvage, CRNA Complications: none Estimated Blood Loss: less than 1 ml  Description of procedure:  The patient was given anesthesia and sedation via intravenous access. The patient was then prepped and draped in the usual fashion. A 25-gauge needle was bent for initiating the capsulorhexis. A 5-0 silk suture was placed through the conjunctiva superior and inferiorly to serve as bridle sutures. Hemostasis was obtained at the superior limbus using an eraser cautery. A partial thickness groove was made at the anterior surgical limbus with a 64 Beaver blade and this was dissected anteriorly with an [employer]. The anterior chamber was entered at 10 o'clock with a 1.0 mm paracentesis knife and through the lamellar dissection with a 2.6 mm Alcon keratome. Epi-Shugarcaine 0.5 CC [9 cc BSS Plus (Alcon), 3 cc 4% preservative-free lidocaine (Hospira) and 4 cc 1:1000 preservative-free, bisulfite-free epinephrine] was injected into the anterior chamber via the paracentesis tract. Epi-Shugarcaine 0.5 CC [9 cc BSS Plus (Alcon), 3 cc 4% preservative-free lidocaine (Hospira) and 4 cc 1:1000 preservative-free, bisulfite-free epinephrine]  was injected into the anterior chamber via the paracentesis tract. DiscoVisc was injected to replace the aqueous and a continuous tear curvilinear capsulorhexis was performed using a bent 25-gauge needle.  Balance salt on a syringe was used to perform hydro-dissection and phacoemulsification was carried out using a divide and conquer technique. Procedure(s) with comments: CATARACT EXTRACTION PHACO AND INTRAOCULAR LENS PLACEMENT ([facility]) (Left) - [place] 01:21 AP% 22.8 CDE 31.49 fluid pack lot # [id number]. Irrigation/aspiration was used to remove the residual cortex and the capsular bag was inflated with DiscoVisc. The intraocular lens was inserted into the capsular bag using a pre-loaded UltraSert Delivery System. Irrigation/aspiration was used to remove the residual DiscoVisc. The wound was inflated with balanced salt and checked for leaks. None were found. Miostat was injected via the paracentesis track and 0.1 ml of cefuroxime containing 1 mg of drug  was injected via the paracentesis track. The wound was checked for leaks again and none were found.   The bridal sutures were removed and two drops of Vigamox were placed on the eye. An eye shield was placed to protect the eye and the patient was discharged to the recovery area in good condition.   Latandra Loureiro MD

## 2015-12-09 NOTE — Anesthesia Preprocedure Evaluation (Signed)
Anesthesia Evaluation  Patient identified by MRN, date of birth, ID band Patient awake    Reviewed: Allergy & Precautions, H&P , NPO status , Patient's Chart, lab work & pertinent test results, reviewed documented beta blocker date and time   History of Anesthesia Complications (+) history of anesthetic complications  Airway Mallampati: II   Neck ROM: full    Dental  (+) Poor Dentition   Pulmonary neg pulmonary ROS, asthma , sleep apnea and Continuous Positive Airway Pressure Ventilation ,    Pulmonary exam normal        Cardiovascular hypertension, + angina + CAD, + Past MI and + Peripheral Vascular Disease  negative cardio ROS Normal cardiovascular exam Rate:Normal     Neuro/Psych PSYCHIATRIC DISORDERS  Neuromuscular disease negative neurological ROS  negative psych ROS   GI/Hepatic negative GI ROS, Neg liver ROS, GERD  Medicated,  Endo/Other  negative endocrine ROSdiabetes  Renal/GU CRFRenal diseasenegative Renal ROS  negative genitourinary   Musculoskeletal   Abdominal   Peds  Hematology negative hematology ROS (+)   Anesthesia Other Findings Past Medical History: No date: Anginal pain (HCC)     Comment: STABLE No date: Arthritis No date: Asthma     Comment: CHRONIC OBSTRUCTIVE No date: B12 deficiency No date: Complication of anesthesia     Comment: SLOW TO WAKE UP No date: Coronary artery disease No date: Depression     Comment: MAJOR No date: Diabetes mellitus without complication (HCC) No date: Edema     Comment: HANDS/FEET No date: GERD (gastroesophageal reflux disease) No date: HOH (hard of hearing) No date: Hypertension No date: Myocardial infarction ([place]) No date: Peripheral vascular disease (HCC) No date: Polyneuropathy ([place]) No date: Renal anomaly     Comment: ARTERY STENOSIS No date: Rhinitis, allergic     Comment: CHRONIC No date: Shoulder disorder     Comment: ROTATOR CUFF TEAR   LEFT No date: Sleep apnea No date: Spinal disorder     Comment: STENOSIS Past Surgical History: No date: APPENDECTOMY No date: CHOLECYSTECTOMY No date: CORONARY ANGIOPLASTY     Comment: STENT No date: CORONARY ARTERY BYPASS GRAFT No date: TOTAL KNEE ARTHROPLASTY Bilateral BMI    Body Mass Index:  44.21 kg/m     Reproductive/Obstetrics                             Anesthesia Physical Anesthesia Plan  ASA: IV  Anesthesia Plan: General   Post-op Pain Management:    Induction:   Airway Management Planned:   Additional Equipment:   Intra-op Plan:   Post-operative Plan:   Informed Consent: I have reviewed the patients History and Physical, chart, labs and discussed the procedure including the risks, benefits and alternatives for the proposed anesthesia with the patient or authorized representative who has indicated his/her understanding and acceptance.   Dental Advisory Given  Plan Discussed with: CRNA  Anesthesia Plan Comments:         Anesthesia Quick Evaluation

## 2015-12-10 ENCOUNTER — Encounter: Payer: Self-pay | Admitting: Ophthalmology

## 2016-05-07 ENCOUNTER — Inpatient Hospital Stay
Admission: EM | Admit: 2016-05-07 | Discharge: 2016-05-11 | DRG: 641 | Disposition: A | Discharge status: Skilled Nursing Facility | Payer: Medicare (Managed Care) | Attending: Internal Medicine | Admitting: Internal Medicine

## 2016-05-07 ENCOUNTER — Emergency Department: Payer: Medicare (Managed Care)

## 2016-05-07 ENCOUNTER — Encounter: Payer: Self-pay | Admitting: Emergency Medicine

## 2016-05-07 DIAGNOSIS — E1151 Type 2 diabetes mellitus with diabetic peripheral angiopathy without gangrene: Secondary | ICD-10-CM | POA: Diagnosis present

## 2016-05-07 DIAGNOSIS — D649 Anemia, unspecified: Secondary | ICD-10-CM | POA: Diagnosis present

## 2016-05-07 DIAGNOSIS — C782 Secondary malignant neoplasm of pleura: Secondary | ICD-10-CM | POA: Diagnosis present

## 2016-05-07 DIAGNOSIS — R296 Repeated falls: Secondary | ICD-10-CM | POA: Diagnosis present

## 2016-05-07 DIAGNOSIS — N649 Disorder of breast, unspecified: Secondary | ICD-10-CM

## 2016-05-07 DIAGNOSIS — M4800 Spinal stenosis, site unspecified: Secondary | ICD-10-CM

## 2016-05-07 DIAGNOSIS — H9190 Unspecified hearing loss, unspecified ear: Secondary | ICD-10-CM | POA: Diagnosis present

## 2016-05-07 DIAGNOSIS — G4730 Sleep apnea, unspecified: Secondary | ICD-10-CM | POA: Diagnosis present

## 2016-05-07 DIAGNOSIS — R918 Other nonspecific abnormal finding of lung field: Secondary | ICD-10-CM | POA: Diagnosis not present

## 2016-05-07 DIAGNOSIS — K219 Gastro-esophageal reflux disease without esophagitis: Secondary | ICD-10-CM | POA: Diagnosis present

## 2016-05-07 DIAGNOSIS — R531 Weakness: Secondary | ICD-10-CM | POA: Diagnosis present

## 2016-05-07 DIAGNOSIS — Z6836 Body mass index (BMI) 36.0-36.9, adult: Secondary | ICD-10-CM | POA: Diagnosis not present

## 2016-05-07 DIAGNOSIS — Z882 Allergy status to sulfonamides status: Secondary | ICD-10-CM

## 2016-05-07 DIAGNOSIS — B369 Superficial mycosis, unspecified: Secondary | ICD-10-CM | POA: Diagnosis present

## 2016-05-07 DIAGNOSIS — C799 Secondary malignant neoplasm of unspecified site: Secondary | ICD-10-CM

## 2016-05-07 DIAGNOSIS — R5383 Other fatigue: Secondary | ICD-10-CM | POA: Diagnosis not present

## 2016-05-07 DIAGNOSIS — Z66 Do not resuscitate: Secondary | ICD-10-CM | POA: Diagnosis present

## 2016-05-07 DIAGNOSIS — Z96653 Presence of artificial knee joint, bilateral: Secondary | ICD-10-CM | POA: Diagnosis present

## 2016-05-07 DIAGNOSIS — C786 Secondary malignant neoplasm of retroperitoneum and peritoneum: Secondary | ICD-10-CM | POA: Diagnosis present

## 2016-05-07 DIAGNOSIS — I10 Essential (primary) hypertension: Secondary | ICD-10-CM | POA: Diagnosis present

## 2016-05-07 DIAGNOSIS — Z993 Dependence on wheelchair: Secondary | ICD-10-CM

## 2016-05-07 DIAGNOSIS — M129 Arthropathy, unspecified: Secondary | ICD-10-CM

## 2016-05-07 DIAGNOSIS — I252 Old myocardial infarction: Secondary | ICD-10-CM

## 2016-05-07 DIAGNOSIS — E538 Deficiency of other specified B group vitamins: Secondary | ICD-10-CM

## 2016-05-07 DIAGNOSIS — M6281 Muscle weakness (generalized): Secondary | ICD-10-CM

## 2016-05-07 DIAGNOSIS — Z955 Presence of coronary angioplasty implant and graft: Secondary | ICD-10-CM | POA: Diagnosis not present

## 2016-05-07 DIAGNOSIS — Z7902 Long term (current) use of antithrombotics/antiplatelets: Secondary | ICD-10-CM

## 2016-05-07 DIAGNOSIS — I701 Atherosclerosis of renal artery: Secondary | ICD-10-CM

## 2016-05-07 DIAGNOSIS — C50911 Malignant neoplasm of unspecified site of right female breast: Secondary | ICD-10-CM | POA: Diagnosis present

## 2016-05-07 DIAGNOSIS — M899 Disorder of bone, unspecified: Secondary | ICD-10-CM

## 2016-05-07 DIAGNOSIS — E7800 Pure hypercholesterolemia, unspecified: Secondary | ICD-10-CM | POA: Diagnosis present

## 2016-05-07 DIAGNOSIS — Z888 Allergy status to other drugs, medicaments and biological substances status: Secondary | ICD-10-CM

## 2016-05-07 DIAGNOSIS — Z79899 Other long term (current) drug therapy: Secondary | ICD-10-CM

## 2016-05-07 DIAGNOSIS — C7951 Secondary malignant neoplasm of bone: Secondary | ICD-10-CM | POA: Diagnosis present

## 2016-05-07 DIAGNOSIS — I2510 Atherosclerotic heart disease of native coronary artery without angina pectoris: Secondary | ICD-10-CM | POA: Diagnosis present

## 2016-05-07 DIAGNOSIS — E1142 Type 2 diabetes mellitus with diabetic polyneuropathy: Secondary | ICD-10-CM | POA: Diagnosis present

## 2016-05-07 DIAGNOSIS — Z951 Presence of aortocoronary bypass graft: Secondary | ICD-10-CM

## 2016-05-07 DIAGNOSIS — J449 Chronic obstructive pulmonary disease, unspecified: Secondary | ICD-10-CM

## 2016-05-07 DIAGNOSIS — I739 Peripheral vascular disease, unspecified: Secondary | ICD-10-CM

## 2016-05-07 DIAGNOSIS — E119 Type 2 diabetes mellitus without complications: Secondary | ICD-10-CM

## 2016-05-07 DIAGNOSIS — R262 Difficulty in walking, not elsewhere classified: Secondary | ICD-10-CM

## 2016-05-07 DIAGNOSIS — F329 Major depressive disorder, single episode, unspecified: Secondary | ICD-10-CM | POA: Diagnosis present

## 2016-05-07 DIAGNOSIS — N63 Unspecified lump in unspecified breast: Secondary | ICD-10-CM

## 2016-05-07 DIAGNOSIS — R609 Edema, unspecified: Secondary | ICD-10-CM

## 2016-05-07 DIAGNOSIS — G629 Polyneuropathy, unspecified: Secondary | ICD-10-CM

## 2016-05-07 LAB — URINALYSIS, COMPLETE (UACMP) WITH MICROSCOPIC
Bilirubin Urine: NEGATIVE
Glucose, UA: NEGATIVE mg/dL
Hgb urine dipstick: NEGATIVE
Ketones, ur: 5 mg/dL — AB
Nitrite: NEGATIVE
PH: 5 (ref 5.0–8.0)
Protein, ur: NEGATIVE mg/dL
SPECIFIC GRAVITY, URINE: 1.02 (ref 1.005–1.030)
Squamous Epithelial / LPF: NONE SEEN

## 2016-05-07 LAB — BASIC METABOLIC PANEL
Anion gap: 7 (ref 5–15)
BUN: 16 mg/dL (ref 6–20)
CALCIUM: 11 mg/dL — AB (ref 8.9–10.3)
CO2: 30 mmol/L (ref 22–32)
Chloride: 102 mmol/L (ref 101–111)
Creatinine, Ser: 0.64 mg/dL (ref 0.44–1.00)
GFR calc non Af Amer: >60 mL/min (ref >60)
Glucose, Bld: 117 mg/dL — ABNORMAL HIGH (ref 65–99)
Potassium: 4.1 mmol/L (ref 3.5–5.1)
Sodium: 139 mmol/L (ref 135–145)

## 2016-05-07 LAB — CBC
HCT: 34.4 % — ABNORMAL LOW (ref 35.0–47.0)
Hemoglobin: 11.4 g/dL — ABNORMAL LOW (ref 12.0–16.0)
MCH: 30.1 pg (ref 26.0–34.0)
MCHC: 33.1 g/dL (ref 32.0–36.0)
MCV: 91 fL (ref 80.0–100.0)
PLATELETS: 232 10*3/uL (ref 150–440)
RBC: 3.78 MIL/uL — AB (ref 3.80–5.20)
RDW: 13.8 % (ref 11.5–14.5)
WBC: 6.2 10*3/uL (ref 3.6–11.0)

## 2016-05-07 LAB — HEPATIC FUNCTION PANEL
ALBUMIN: 3.2 g/dL — AB (ref 3.5–5.0)
ALK PHOS: 110 U/L (ref 38–126)
ALT: 10 U/L — ABNORMAL LOW (ref 14–54)
AST: 21 U/L (ref 15–41)
Bilirubin, Direct: 0.1 mg/dL (ref 0.1–0.5)
Indirect Bilirubin: 0.3 mg/dL (ref 0.3–0.9)
TOTAL PROTEIN: 6.4 g/dL — AB (ref 6.5–8.1)
Total Bilirubin: 0.4 mg/dL (ref 0.3–1.2)

## 2016-05-07 MED ORDER — FLUOXETINE HCL 20 MG PO CAPS
40.0000 mg | ORAL_CAPSULE | Freq: Every day | ORAL | Status: DC
  Administered 2016-05-07 – 2016-05-11 (×5): 40 mg via ORAL
  Filled 2016-05-07 (×5): qty 2

## 2016-05-07 MED ORDER — ONDANSETRON HCL 4 MG/2ML IJ SOLN: 4.0000 mg | Freq: Four times a day (QID) | INTRAMUSCULAR | Status: DC | PRN

## 2016-05-07 MED ORDER — ONDANSETRON HCL 4 MG PO TABS: 4.0000 mg | ORAL_TABLET | Freq: Four times a day (QID) | ORAL | Status: DC | PRN

## 2016-05-07 MED ORDER — NITROGLYCERIN 0.4 MG SL SUBL: 0.4000 mg | SUBLINGUAL_TABLET | SUBLINGUAL | Status: DC | PRN

## 2016-05-07 MED ORDER — ACETAMINOPHEN 650 MG RE SUPP: 650.0000 mg | Freq: Four times a day (QID) | RECTAL | Status: DC | PRN

## 2016-05-07 MED ORDER — PRAVASTATIN SODIUM 40 MG PO TABS
80.0000 mg | ORAL_TABLET | Freq: Every day | ORAL | Status: DC
  Administered 2016-05-07 – 2016-05-10 (×4): 80 mg via ORAL
  Filled 2016-05-07 (×4): qty 2

## 2016-05-07 MED ORDER — SODIUM CHLORIDE 0.9 % IV SOLN
Freq: Once | INTRAVENOUS | Status: AC
  Administered 2016-05-07: 02:00:00 via INTRAVENOUS

## 2016-05-07 MED ORDER — NYSTATIN 100000 UNIT/GM EX POWD
Freq: Once | CUTANEOUS | Status: AC
  Administered 2016-05-07: 04:00:00 via TOPICAL
  Filled 2016-05-07: qty 15

## 2016-05-07 MED ORDER — CLOPIDOGREL BISULFATE 75 MG PO TABS
75.0000 mg | ORAL_TABLET | Freq: Every day | ORAL | Status: DC
Start: 2016-05-07 — End: 2016-05-07
  Administered 2016-05-07: 75 mg via ORAL
  Filled 2016-05-07: qty 1

## 2016-05-07 MED ORDER — SODIUM CHLORIDE 0.9 % IV SOLN
INTRAVENOUS | Status: DC
  Administered 2016-05-07 (×2): via INTRAVENOUS

## 2016-05-07 MED ORDER — IOPAMIDOL (ISOVUE-M 200) INJECTION 41%: 10.0000 mL | Freq: Once | INTRAMUSCULAR | Status: DC | PRN

## 2016-05-07 MED ORDER — FUROSEMIDE 10 MG/ML IJ SOLN
40.0000 mg | Freq: Once | INTRAMUSCULAR | Status: AC
  Administered 2016-05-07: 40 mg via INTRAVENOUS
  Filled 2016-05-07: qty 4

## 2016-05-07 MED ORDER — ACETAMINOPHEN 325 MG PO TABS
650.0000 mg | ORAL_TABLET | Freq: Four times a day (QID) | ORAL | Status: DC | PRN
  Administered 2016-05-08 – 2016-05-09 (×2): 650 mg via ORAL
  Filled 2016-05-07 (×2): qty 2

## 2016-05-07 MED ORDER — CEFTRIAXONE SODIUM-DEXTROSE 1-3.74 GM-% IV SOLR
1.0000 g | Freq: Once | INTRAVENOUS | Status: AC
  Administered 2016-05-07: 1 g via INTRAVENOUS
  Filled 2016-05-07: qty 50

## 2016-05-07 MED ORDER — SENNOSIDES-DOCUSATE SODIUM 8.6-50 MG PO TABS
1.0000 | ORAL_TABLET | Freq: Every evening | ORAL | Status: DC | PRN
  Administered 2016-05-08 – 2016-05-09 (×2): 1 via ORAL
  Filled 2016-05-07 (×2): qty 1

## 2016-05-07 MED ORDER — GABAPENTIN 300 MG PO CAPS
900.0000 mg | ORAL_CAPSULE | Freq: Every day | ORAL | Status: DC
  Administered 2016-05-07 – 2016-05-10 (×4): 900 mg via ORAL
  Filled 2016-05-07 (×4): qty 3

## 2016-05-07 MED ORDER — PANTOPRAZOLE SODIUM 40 MG PO TBEC
40.0000 mg | DELAYED_RELEASE_TABLET | Freq: Every day | ORAL | Status: DC
  Administered 2016-05-07 – 2016-05-11 (×5): 40 mg via ORAL
  Filled 2016-05-07 (×5): qty 1

## 2016-05-07 MED ORDER — DEXTROSE 5 % IV SOLN: 1.0000 g | Freq: Once | INTRAVENOUS | Status: DC

## 2016-05-07 MED ORDER — ENOXAPARIN SODIUM 40 MG/0.4ML ~~LOC~~ SOLN
40.0000 mg | SUBCUTANEOUS | Status: DC
  Administered 2016-05-07 – 2016-05-09 (×3): 40 mg via SUBCUTANEOUS
  Filled 2016-05-07 (×3): qty 0.4

## 2016-05-07 MED ORDER — VITAMIN D (ERGOCALCIFEROL) 1.25 MG (50000 UNIT) PO CAPS
50000.0000 [IU] | ORAL_CAPSULE | ORAL | Status: DC
  Administered 2016-05-09: 09:00:00 50000 [IU] via ORAL
  Filled 2016-05-07: qty 1

## 2016-05-07 MED ORDER — ZOLEDRONIC ACID 4 MG/5ML IV CONC
4.0000 mg | Freq: Once | INTRAVENOUS | Status: AC
  Administered 2016-05-07: 16:00:00 4 mg via INTRAVENOUS
  Filled 2016-05-07: qty 5

## 2016-05-07 MED ORDER — NYSTATIN 100000 UNIT/GM EX POWD
Freq: Three times a day (TID) | CUTANEOUS | Status: DC
  Administered 2016-05-07 – 2016-05-11 (×12): via TOPICAL
  Filled 2016-05-07 (×3): qty 15

## 2016-05-07 MED ORDER — IOPAMIDOL (ISOVUE-300) INJECTION 61%
100.0000 mL | Freq: Once | INTRAVENOUS | Status: AC | PRN
  Administered 2016-05-07: 100 mL via INTRAVENOUS

## 2016-05-07 NOTE — ED Triage Notes (Signed)
Pt from home with weakness increasing over last "few days". Pt is normally in wheelchair and able to transfer without difficulty per ems but has had "frequent falls" this week. Pt with fsbs of 110 per ems. Pt alert and oriented x4. Strong odor of urine noted.

## 2016-05-07 NOTE — ED Notes (Signed)
Pt with yeast like excoriation noted to folds of panus, groin and between labia. Pt with white cottage cheese like drainage from vagina.  md notified.

## 2016-05-07 NOTE — Progress Notes (Signed)
Patient ID: Jeanette White, female   DOB: [birth year], 82 y.o.   MRN: [id number]  [employer] [facility] PROGRESS NOTE  Jeanette White [id number] DOB: Apr 13, 1928 DOA: 05/07/2016 PCP: No primary care provider on file.  HPI/Subjective: Patient came in with weakness. She is normally wheelchair bound. She wanted to come in to find out what was wrong with her legs and why her strength wasn't there. She was found to have a high calcium and CT scan showed a breast mass and right hilar mass pulmonary and pleural and peritoneal nodules  Objective: Vitals:   05/07/16 0637 05/07/16 1349  BP: (!) 135/52 (!) 110/52  Pulse: 66 71  Resp: 17   Temp: 97.5 F (36.4 C) 98 F (36.7 C)    Filed Weights   05/07/16 0054 05/07/16 0637  Weight: 97.1 kg (214 lb) 97.5 kg (215 lb)    ROS: Review of Systems  Constitutional: Negative for chills and fever.  Eyes: Negative for blurred vision.  Respiratory: Positive for shortness of breath. Negative for cough.   Cardiovascular: Negative for chest pain.  Gastrointestinal: Negative for abdominal pain, constipation, diarrhea, nausea and vomiting.  Genitourinary: Negative for dysuria.  Musculoskeletal: Negative for joint pain.  Neurological: Positive for weakness. Negative for dizziness and headaches.   Exam: Physical Exam  Constitutional: She is oriented to person, place, and time.  HENT:  Nose: No mucosal edema.  Mouth/Throat: No oropharyngeal exudate or posterior oropharyngeal edema.  Eyes: Conjunctivae, EOM and lids are normal. Pupils are equal, round, and reactive to light.  Neck: No JVD present. Carotid bruit is not present. No edema present. No thyroid mass and no thyromegaly present.  Cardiovascular: S1 normal and S2 normal.  Exam reveals no gallop.   No murmur heard. Pulses:      Dorsalis pedis pulses are 2+ on the right side, and 2+ on the left side.  Respiratory: No respiratory distress. She has decreased breath sounds in the right lower field and  the left lower field. She has no wheezes. She has no rhonchi. She has rales in the right lower field and the left lower field.  GI: Soft. Bowel sounds are normal. There is no tenderness.  Musculoskeletal:       Right ankle: She exhibits swelling.       Left ankle: She exhibits swelling.  Lymphadenopathy:    She has no cervical adenopathy.  Neurological: She is alert and oriented to person, place, and time. No cranial nerve deficit.  Skin: Skin is warm. No rash noted. Nails show no clubbing.  Psychiatric: She has a normal mood and affect.      Data Reviewed: Basic Metabolic Panel:  Recent Labs Lab 05/07/16 0059  NA 139  K 4.1  CL 102  CO2 30  GLUCOSE 117*  BUN 16  CREATININE 0.64  CALCIUM 11.0*   Liver Function Tests:  Recent Labs Lab 05/07/16 0059  AST 21  ALT 10*  ALKPHOS 110  BILITOT 0.4  PROT 6.4*  ALBUMIN 3.2*   CBC:  Recent Labs Lab 05/07/16 0059  WBC 6.2  HGB 11.4*  HCT 34.4*  MCV 91.0  PLT 232     Studies: Dg Chest 1 View  Result Date: 05/07/2016 CLINICAL DATA:  Chest pain and weakness. EXAM: CHEST 1 VIEW COMPARISON:  06/10/2013, 04/18/2013 FINDINGS: Innumerable pulmonary nodules, significantly increased from 06/10/2013 and 04/18/2013. This may represent diffuse metastatic disease. There also is a small right pleural effusion which is new. Moderate cardiomegaly, probably unchanged. Mild right  base lung opacity adjacent to the pleural effusion, likely atelectatic. IMPRESSION: 1. Numerous pulmonary nodules, suspicious for hematogenous metastases. This is progressed from 06/10/2013. 2. Unchanged cardiomegaly. 3. Right pleural effusion and adjacent atelectatic appearing lung base opacities. Electronically Signed   By: Andreas Newport M.D.   On: 05/07/2016 02:08   Dg Pelvis 1-2 Views  Result Date: 05/07/2016 CLINICAL DATA:  82 year old female with weakness and pelvic pain. No injury. EXAM: PELVIS - 1-2 VIEW COMPARISON:  CT of the abdomen pelvis dated  04/18/2013 FINDINGS: There is no acute fracture or dislocation. There is advanced osteopenia with osteoarthritic changes of the hips. Lucencies over the femoral heads and neck likely related to osteopenia. Metastatic disease is much less likely. Partially visualized intramedullary cement in the mid right femoral diaphysis. Hernia repair mesh material noted. The soft tissues are grossly unremarkable. IMPRESSION: No acute fracture or dislocation. Advanced osteopenia with osteoarthritic changes of the hips. Electronically Signed   By: Anner Crete M.D.   On: 05/07/2016 02:11   Ct Chest W Contrast  Result Date: 05/07/2016 CLINICAL DATA:  Hypercalcemia.  Concern for metastatic disease. EXAM: CT CHEST, ABDOMEN, AND PELVIS WITH CONTRAST TECHNIQUE: Multidetector CT imaging of the chest, abdomen and pelvis was performed following the standard protocol during bolus administration of intravenous contrast. CONTRAST:  166mL ISOVUE-300 IOPAMIDOL (ISOVUE-300) INJECTION 61% COMPARISON:  None. FINDINGS: CT CHEST FINDINGS Cardiovascular: There is coronary artery and aortic atherosclerosis. Heart size is within normal limits. Mediastinum/Nodes: There is no definite mediastinal adenopathy. There are multiple masses along margin of the mediastinum, but I suspect that these are actually pleural based. Lungs/Pleura: There are numerous pleural based masses, the largest of which is at the anterior right hilum, measuring 4.7 x 3.7 cm, which encases and occludes the right middle lobe bronchus and abuts the right pulmonary artery and anterior right pulmonary vein. There is an additional large mass of the right lung apex, measuring 3.6 x 4.2 cm. There is a medium-sized right pleural effusion and small left pleural effusion. There are numerous scattered pulmonary nodules. The largest nodules are in the right upper lobe, measuring up to 2.2 cm. Nodules within the left lung are smaller, measuring up to 1.3 cm. The degree of pleural  disease on the left is less than that on the right. In multiple locations, the pleural disease extends into the peri pleural fat. However, I do not see definite chest wall invasion. There is no extension of the disease and the spinal canal. Musculoskeletal: The mass in the lower outer quadrant of the right breast measures 3.2 x 2.8 cm. CT ABDOMEN PELVIS FINDINGS Hepatobiliary: No focal liver lesions. No biliary dilatation. The gallbladder surgically absent. Pancreas: The head and body of pancreas are atrophic. The tail is unremarkable. No pancreatic ductal dilatation or peripancreatic fluid collection. Spleen: Normal Adrenals/Urinary Tract: The adrenal glands are normal. No hydronephrosis or focal renal mass. Stomach/Bowel: Surgical staples noted within the anterior abdomen. The appendix is not visualized. No dilated small bowel or other evidence of obstruction. No enteric or colonic inflammation. Vascular/Lymphatic: There are multiple abnormal appearing aortocaval lymph nodes measuring up to 1.1 cm. Reproductive: Status post hysterectomy.  No adnexal mass. Other: There are multiple retroperitoneal nodules measuring up to 1.4 x 0.6 cm. Musculoskeletal: Hyperenhancing nodule noted within the left thigh musculature just adjacent to the left iliac wing (series 2, image 92). Bones are osteopenic. There is multilevel lumbar facet arthrosis. Mixed lytic and sclerotic lesions are seen within the T12 vertebral body. There is  a lytic lesion within the T11 spinous process. There are other areas of heterogeneous density within the thoracic vertebral column. There are multiple areas of abnormal sclerosis within the ribs. IMPRESSION: 1. Severe bilateral pulmonary and pleural metastatic disease, with dominant right hilar mass which encases and occludes the right middle lobe bronchus and abuts the right pulmonary artery. 2. Multiple peritoneal nodules and enlarged, abnormal appearing aortocaval lymph nodes also consistent with  metastatic disease. 3. Multiple abnormal aortocaval lymph nodes measuring up to 1.1 cm, suggesting lymphatic spread of disease. 4. Soft tissue masses of the right axilla and right breast. This could indicate a primary breast carcinoma versus soft tissue metastatic disease. Additional extraperitoneal soft tissue metastasis adjacent to the left iliac wing. 5. Multifocal osseous metastatic disease. 6. Aortic and coronary artery atherosclerosis. Electronically Signed   By: Ulyses Jarred M.D.   On: 05/07/2016 04:14   Ct Abdomen Pelvis W Contrast  Result Date: 05/07/2016 CLINICAL DATA:  Hypercalcemia.  Concern for metastatic disease. EXAM: CT CHEST, ABDOMEN, AND PELVIS WITH CONTRAST TECHNIQUE: Multidetector CT imaging of the chest, abdomen and pelvis was performed following the standard protocol during bolus administration of intravenous contrast. CONTRAST:  122mL ISOVUE-300 IOPAMIDOL (ISOVUE-300) INJECTION 61% COMPARISON:  None. FINDINGS: CT CHEST FINDINGS Cardiovascular: There is coronary artery and aortic atherosclerosis. Heart size is within normal limits. Mediastinum/Nodes: There is no definite mediastinal adenopathy. There are multiple masses along margin of the mediastinum, but I suspect that these are actually pleural based. Lungs/Pleura: There are numerous pleural based masses, the largest of which is at the anterior right hilum, measuring 4.7 x 3.7 cm, which encases and occludes the right middle lobe bronchus and abuts the right pulmonary artery and anterior right pulmonary vein. There is an additional large mass of the right lung apex, measuring 3.6 x 4.2 cm. There is a medium-sized right pleural effusion and small left pleural effusion. There are numerous scattered pulmonary nodules. The largest nodules are in the right upper lobe, measuring up to 2.2 cm. Nodules within the left lung are smaller, measuring up to 1.3 cm. The degree of pleural disease on the left is less than that on the right. In multiple  locations, the pleural disease extends into the peri pleural fat. However, I do not see definite chest wall invasion. There is no extension of the disease and the spinal canal. Musculoskeletal: The mass in the lower outer quadrant of the right breast measures 3.2 x 2.8 cm. CT ABDOMEN PELVIS FINDINGS Hepatobiliary: No focal liver lesions. No biliary dilatation. The gallbladder surgically absent. Pancreas: The head and body of pancreas are atrophic. The tail is unremarkable. No pancreatic ductal dilatation or peripancreatic fluid collection. Spleen: Normal Adrenals/Urinary Tract: The adrenal glands are normal. No hydronephrosis or focal renal mass. Stomach/Bowel: Surgical staples noted within the anterior abdomen. The appendix is not visualized. No dilated small bowel or other evidence of obstruction. No enteric or colonic inflammation. Vascular/Lymphatic: There are multiple abnormal appearing aortocaval lymph nodes measuring up to 1.1 cm. Reproductive: Status post hysterectomy.  No adnexal mass. Other: There are multiple retroperitoneal nodules measuring up to 1.4 x 0.6 cm. Musculoskeletal: Hyperenhancing nodule noted within the left thigh musculature just adjacent to the left iliac wing (series 2, image 92). Bones are osteopenic. There is multilevel lumbar facet arthrosis. Mixed lytic and sclerotic lesions are seen within the T12 vertebral body. There is a lytic lesion within the T11 spinous process. There are other areas of heterogeneous density within the thoracic vertebral column. There  are multiple areas of abnormal sclerosis within the ribs. IMPRESSION: 1. Severe bilateral pulmonary and pleural metastatic disease, with dominant right hilar mass which encases and occludes the right middle lobe bronchus and abuts the right pulmonary artery. 2. Multiple peritoneal nodules and enlarged, abnormal appearing aortocaval lymph nodes also consistent with metastatic disease. 3. Multiple abnormal aortocaval lymph nodes  measuring up to 1.1 cm, suggesting lymphatic spread of disease. 4. Soft tissue masses of the right axilla and right breast. This could indicate a primary breast carcinoma versus soft tissue metastatic disease. Additional extraperitoneal soft tissue metastasis adjacent to the left iliac wing. 5. Multifocal osseous metastatic disease. 6. Aortic and coronary artery atherosclerosis. Electronically Signed   By: Ulyses Jarred M.D.   On: 05/07/2016 04:14    Scheduled Meds: . clopidogrel  75 mg Oral Daily  . enoxaparin (LOVENOX) injection  40 mg Subcutaneous Q24H  . FLUoxetine  40 mg Oral Daily  . gabapentin  900 mg Oral QHS  . nystatin   Topical TID  . pantoprazole  40 mg Oral QAC breakfast  . pravastatin  80 mg Oral QHS  . [START ON 05/09/2016] Vitamin D (Ergocalciferol)  50,000 Units Oral Q30 days  . zoledronic acid (ZOMETA) IV  4 mg Intravenous Once   Continuous Infusions: . sodium chloride 40 mL/hr at 05/07/16 1335    Assessment/Plan:  1. Hypercalcemia of malignancy. Decrease rate of IV fluids since oncology ordered a dose of Zometa. 2. Right breast mass and lymphadenopathy, right hilar mass with pleural, pulmonary and peritoneal metastases. Oncology ordered an ultrasound-guided biopsy of the right breast. May also need a bronchoscopy for the right hilar mass. Hold Plavix. 3. Fungal infection under skin folds nystatin topical 3 times a day 4. GERD on Protonix 5. Hyperlipidemia unspecified on pravastatin 6. Depression on fluoxetine 7. Morbid obesity  Code Status:     Code Status Orders        Start     Ordered   05/07/16 0655  Do not attempt resuscitation (DNR)  Continuous    Question Answer Comment  In the event of cardiac or respiratory ARREST Do not call a "code blue"   In the event of cardiac or respiratory ARREST Do not perform Intubation, CPR, defibrillation or ACLS   In the event of cardiac or respiratory ARREST Use medication by any route, position, wound care, and other  measures to relive pain and suffering. May use oxygen, suction and manual treatment of airway obstruction as needed for comfort.      05/07/16 0654    Code Status History    Date Active Date Inactive Code Status Order ID Comments User Context   This patient has a current code status but no historical code status.    Advance Directive Documentation   Flowsheet Row Most Recent Value  Type of Advance Directive  Out of facility DNR (pink MOST or yellow form)  Pre-existing out of facility DNR order (yellow form or pink MOST form)  Yellow form placed in chart (order not valid for inpatient use)  "MOST" Form in Place?  No data     Family Communication: No working phone numbers in the chart. Family driving into town on Christmas Disposition Plan: TBD  Consultants:  Oncology  Pulmonary  Time spent: 21 minutes   [place], [place]

## 2016-05-07 NOTE — ED Notes (Signed)
Patient transported to X-ray 

## 2016-05-07 NOTE — Plan of Care (Signed)
Problem: Physical Regulation: Goal: Ability to maintain clinical measurements within normal limits will improve Outcome: Not Progressing msad abd  Folds  Nystatin powder

## 2016-05-07 NOTE — ED Provider Notes (Addendum)
[facility] Emergency Department Provider Note        Time seen: ----------------------------------------- 1:35 AM on 05/07/2016 -----------------------------------------    I have reviewed the triage vital signs and the nursing notes.   HISTORY  Chief Complaint Weakness    HPI Jeanette White is a 82 y.o. female who presents to the ER for weakness that has been increasing over the past several days. Normally she is wheelchair bound but she is able to transfer without difficulty. EMS states she's had frequent falls this week. Patient states she is unable to transfer which is a new problem for her. She's been wheelchair bound for several years and lives alone.   Past Medical History:  Diagnosis Date  . Anginal pain (HCC)    STABLE  . Arthritis   . Asthma    CHRONIC OBSTRUCTIVE  . B12 deficiency   . Complication of anesthesia    SLOW TO WAKE UP  . Coronary artery disease   . Depression    MAJOR  . Diabetes mellitus without complication ([place])   . Edema    HANDS/FEET  . GERD (gastroesophageal reflux disease)   . HOH (hard of hearing)   . Hypertension   . Myocardial infarction   . Peripheral vascular disease ([place])   . Polyneuropathy ([place])   . Renal anomaly    ARTERY STENOSIS  . Rhinitis, allergic    CHRONIC  . Shoulder disorder    ROTATOR CUFF TEAR  LEFT  . Sleep apnea   . Spinal disorder    STENOSIS    There are no active problems to display for this patient.   Past Surgical History:  Procedure Laterality Date  . APPENDECTOMY    . CATARACT EXTRACTION W/PHACO Left 12/09/2015   Procedure: CATARACT EXTRACTION PHACO AND INTRAOCULAR LENS PLACEMENT (IOC);  Surgeon: Estill Cotta, MD;  Location: [facility] ORS;  Service: Ophthalmology;  Laterality: Left;  [place] 01:21 AP% 22.8 CDE 31.49 fluid pack lot # [id number]  . CHOLECYSTECTOMY    . CORONARY ANGIOPLASTY     STENT  . CORONARY ARTERY BYPASS GRAFT    . TOTAL KNEE ARTHROPLASTY Bilateral      Allergies Ace inhibitors and Sulfa antibiotics  Social History Social History  Substance Use Topics  . Smoking status: Never Smoker  . Smokeless tobacco: Never Used  . Alcohol use No    Review of Systems Constitutional: Negative for fever. Cardiovascular: Negative for chest pain. Respiratory: Negative for shortness of breath. Gastrointestinal: Negative for abdominal pain, vomiting and diarrhea. Genitourinary: Negative for dysuria. Musculoskeletal: Negative for back pain. Skin: Negative for rash. Neurological: Negative for headaches,Positive for weakness  10-point ROS otherwise negative.  ____________________________________________   PHYSICAL EXAM:  VITAL SIGNS: ED Triage Vitals  Enc Vitals Group     BP 05/07/16 0052 99/60     Pulse Rate 05/07/16 0052 (!) 58     Resp 05/07/16 0052 16     Temp 05/07/16 0052 97.8 F (36.6 C)     Temp Source 05/07/16 0052 Oral     SpO2 05/07/16 0052 92 %     Weight 05/07/16 0054 214 lb (97.1 kg)     Height --      Head Circumference --      Peak Flow --      Pain Score --      Pain Loc --      Pain Edu? --      Excl. in [place]? --     Constitutional:  Alert and oriented. Well appearing and in no distress. Eyes: Conjunctivae are normal. PERRL. Normal extraocular movements. ENT   Head: Normocephalic and atraumatic.   Nose: No congestion/rhinnorhea.   Mouth/Throat: Mucous membranes are moist.   Neck: No stridor. Cardiovascular: Normal rate, regular rhythm. No murmurs, rubs, or gallops. Respiratory: Normal respiratory effort without tachypnea nor retractions. Breath sounds are clear and equal bilaterally. No wheezes/rales/rhonchi. Gastrointestinal: Soft and nontender. Normal bowel sounds Musculoskeletal: Nontender with normal range of motion in all extremities. No lower extremity tenderness nor edema. Neurologic:  Normal speech and language. No gross focal neurologic deficits are appreciated.  Skin:  Skin is warm, dry  and intact. No rash noted. Psychiatric: Mood and affect are normal. Speech and behavior are normal.  ____________________________________________  EKG: Interpreted by me. Sinus rhythm rate of 56 bpm, prolonged PR interval, normal QRS, normal QT, nonspecific T wave changes  ____________________________________________  ED COURSE:  Pertinent labs & imaging results that were available during my care of the patient were reviewed by me and considered in my medical decision making (see chart for details). Clinical Course   Patient presents to ER for weakness of uncertain etiology. We will assess with labs and possibly imaging.  Procedures ____________________________________________   LABS (pertinent positives/negatives)  Labs Reviewed  BASIC METABOLIC PANEL - Abnormal; Notable for the following:       Result Value   Glucose, Bld 117 (*)    Calcium 11.0 (*)    All other components within normal limits  CBC - Abnormal; Notable for the following:    RBC 3.78 (*)    Hemoglobin 11.4 (*)    HCT 34.4 (*)    All other components within normal limits  URINALYSIS, COMPLETE (UACMP) WITH MICROSCOPIC - Abnormal; Notable for the following:    Color, Urine YELLOW (*)    APPearance HAZY (*)    Ketones, ur 5 (*)    Leukocytes, UA SMALL (*)    Bacteria, UA RARE (*)    All other components within normal limits  HEPATIC FUNCTION PANEL  IMPRESSION: 1. Numerous pulmonary nodules, suspicious for hematogenous metastases. This is progressed from 06/10/2013. 2. Unchanged cardiomegaly. 3. Right pleural effusion and adjacent atelectatic appearing lung base opacities. IMPRESSION: No acute fracture or dislocation.  Advanced osteopenia with osteoarthritic changes of the hips.  CT chest/abd pelvis IMPRESSION: 1. Severe bilateral pulmonary and pleural metastatic disease, with dominant right hilar mass which encases and occludes the right middle lobe bronchus and abuts the right pulmonary artery. 2.  Multiple peritoneal nodules and enlarged, abnormal appearing aortocaval lymph nodes also consistent with metastatic disease. 3. Multiple abnormal aortocaval lymph nodes measuring up to 1.1 cm, suggesting lymphatic spread of disease. 4. Soft tissue masses of the right axilla and right breast. This could indicate a primary breast carcinoma versus soft tissue metastatic disease. Additional extraperitoneal soft tissue metastasis adjacent to the left iliac wing. 5. Multifocal osseous metastatic disease. 6. Aortic and coronary artery atherosclerosis.   ____________________________________________  FINAL ASSESSMENT AND PLAN  Weakness, Hypercalcemia, metastatic cancer  Plan: Patient with labs and imaging as dictated above. Patient presents to the ER after weakness And was found to have high calcium levels suspicious for cancer. X-ray findings confirmed likely hematogenous metastasis. CT scan was then performed. She's been started on IV fluids, will discuss with the hospitalist for admission and possibly oncology consultation.   Earleen Newport, MD   Note: This dictation was prepared with Dragon dictation. Any transcriptional errors that result from this process  are unintentional    Earleen Newport, MD 05/07/16 [place], MD 05/07/16 [phone]

## 2016-05-07 NOTE — ED Notes (Signed)
Pt states she has home health aides for one hour every am and pm with exception of 3 hours on Sunday. Pt states she will not have any aides due to holiday on sat and sun. Pt states [facility], tues, Thursday or [facility] during day.

## 2016-05-07 NOTE — H&P (Addendum)
[facility] [place] NAME: Jeanette White    MR#:  [id number]  DATE OF BIRTH:  26-Apr-1928  DATE OF ADMISSION:  05/07/2016  PRIMARY CARE PHYSICIAN: No primary care provider on file.   REQUESTING/REFERRING PHYSICIAN:   CHIEF COMPLAINT:   Chief Complaint  Patient presents with  . Weakness    HISTORY OF PRESENT ILLNESS: Jeanette White  is a 82 y.o. female with a known history of Arthritis, bronchial month, coronary artery disease, diabetes mellitus, GERD, hypertension, peripheral vascular disease, neuropathy, spinal stenosis, spleen apnea presented to the emergency room with generalized weakness. Patient is usually wheelchair-bound and transfers and ambulates. But off late she's not able to transfer from the wheelchair and has frequent falls. Patient goes to pace program 3 days a week. She presented to the emergency room and she was worked up was found to have elevated calcium level. Chest x-ray showed multiple pulmonary nodules. CT chest abdomen pelvis showed a peritoneal nodules, lymph nodes swelling and a right axillary mass with right breast mass. No complaints of any chest pain, shortness of breath. No tingling or numbness sensation. No headache dizziness or blurry vision.  PAST MEDICAL HISTORY:   Past Medical History:  Diagnosis Date  . Anginal pain (HCC)    STABLE  . Arthritis   . Asthma    CHRONIC OBSTRUCTIVE  . B12 deficiency   . Complication of anesthesia    SLOW TO WAKE UP  . Coronary artery disease   . Depression    MAJOR  . Diabetes mellitus without complication ([place])   . Edema    HANDS/FEET  . GERD (gastroesophageal reflux disease)   . HOH (hard of hearing)   . Hypertension   . Myocardial infarction   . Peripheral vascular disease ([place])   . Polyneuropathy ([place])   . Renal anomaly    ARTERY STENOSIS  . Rhinitis, allergic    CHRONIC  . Shoulder disorder    ROTATOR CUFF TEAR  LEFT  . Sleep apnea   . Spinal  disorder    STENOSIS    PAST SURGICAL HISTORY: Past Surgical History:  Procedure Laterality Date  . APPENDECTOMY    . CATARACT EXTRACTION W/PHACO Left 12/09/2015   Procedure: CATARACT EXTRACTION PHACO AND INTRAOCULAR LENS PLACEMENT (IOC);  Surgeon: Estill Cotta, MD;  Location: [facility] ORS;  Service: Ophthalmology;  Laterality: Left;  [place] 01:21 AP% 22.8 CDE 31.49 fluid pack lot # [id number]  . CHOLECYSTECTOMY    . CORONARY ANGIOPLASTY     STENT  . CORONARY ARTERY BYPASS GRAFT    . TOTAL KNEE ARTHROPLASTY Bilateral     SOCIAL HISTORY:  Social History  Substance Use Topics  . Smoking status: Never Smoker  . Smokeless tobacco: Never Used  . Alcohol use No    FAMILY HISTORY:  Family History  Problem Relation Age of Onset  . Alzheimer's disease Sister   . Arthritis Sister     DRUG ALLERGIES:  Allergies  Allergen Reactions  . Ace Inhibitors   . Sulfa Antibiotics     REVIEW OF SYSTEMS:   CONSTITUTIONAL: No fever, has weakness.  EYES: No blurred or double vision.  EARS, NOSE, AND THROAT: No tinnitus or ear pain.  RESPIRATORY: No cough, shortness of breath, wheezing or hemoptysis.  CARDIOVASCULAR: No chest pain, orthopnea, edema.  GASTROINTESTINAL: No nausea, vomiting, diarrhea or abdominal pain.  GENITOURINARY: No dysuria, hematuria.  ENDOCRINE: No polyuria, nocturia,  HEMATOLOGY: No anemia, easy bruising  or bleeding SKIN: No rash or lesion. MUSCULOSKELETAL: has arthritis.   NEUROLOGIC: No tingling, numbness, weakness.  PSYCHIATRY: No anxiety or depression.   MEDICATIONS AT HOME:  Prior to Admission medications   Medication Sig Start Date End Date Taking? Authorizing Provider  clopidogrel (PLAVIX) 75 MG tablet Take 75 mg by mouth daily.    Historical Provider, MD  FLUoxetine (PROZAC) 40 MG capsule Take 40 mg by mouth daily.    Historical Provider, MD  furosemide (LASIX) 20 MG tablet Take 10 mg by mouth daily.    Historical Provider, MD  furosemide (LASIX) 20 MG  tablet Take 20 mg by mouth daily as needed (IN ADDITION TO BUBBLE PACK FOR ADDITIONAL SWELLING).    Historical Provider, MD  gabapentin (NEURONTIN) 300 MG capsule Take 900 mg by mouth at bedtime.    Historical Provider, MD  lidocaine (LIDODERM) 5 % Place 1 patch onto the skin daily as needed. Remove & Discard patch within 12 hours or as directed by MD    Historical Provider, MD  nitroGLYCERIN (NITROSTAT) 0.4 MG SL tablet Place 0.4 mg under the tongue every 5 (five) minutes as needed for chest pain.    Historical Provider, MD  pantoprazole (PROTONIX) 40 MG tablet Take 40 mg by mouth daily.     Historical Provider, MD  potassium chloride (K-DUR,KLOR-CON) 10 MEQ tablet Take 10 mEq by mouth 3 (three) times daily. 2 AM 1 HS    Historical Provider, MD  pravastatin (PRAVACHOL) 80 MG tablet Take 80 mg by mouth at bedtime.     Historical Provider, MD  ranitidine (ZANTAC) 150 MG tablet Take 150 mg by mouth daily.    Historical Provider, MD  valsartan-hydrochlorothiazide (DIOVAN-HCT) 80-12.5 MG tablet Take 1 tablet by mouth.    Historical Provider, MD  Vitamin D, Ergocalciferol, (DRISDOL) 50000 units CAPS capsule Take 50,000 Units by mouth every 30 (thirty) days.     Historical Provider, MD      PHYSICAL EXAMINATION:   VITAL SIGNS: Blood pressure 113/67, pulse 64, temperature 97.8 F (36.6 C), temperature source Oral, resp. rate 19, weight 97.1 kg (214 lb), SpO2 98 %.  GENERAL:  82 y.o.-year-old patient lying in the bed with no acute distress.  EYES: Pupils equal, round, reactive to light and accommodation. No scleral icterus. Extraocular muscles intact.  HEENT: Head atraumatic, normocephalic. Oropharynx dry and nasopharynx clear.  NECK:  Supple, no jugular venous distention. No thyroid enlargement, no tenderness.  LUNGS: Normal breath sounds bilaterally, no wheezing, rales,rhonchi or crepitation. No use of accessory muscles of respiration.  Right axillary lymphadenopathy CARDIOVASCULAR: S1, S2 normal.  No murmurs, rubs, or gallops.  ABDOMEN:obese, Soft, nontender, nondistended. Bowel sounds present. No organomegaly or mass.  EXTREMITIES: No pedal edema, cyanosis, or clubbing.  NEUROLOGIC: Cranial nerves II through XII are intact. Muscle strength 5/5 in all extremities. Sensation intact. Gait not checked.  PSYCHIATRIC: The patient is alert and oriented x 3.  SKIN: No obvious rash, lesion, or ulcer.   LABORATORY PANEL:   CBC  Recent Labs Lab 05/07/16 0059  WBC 6.2  HGB 11.4*  HCT 34.4*  PLT 232  MCV 91.0  MCH 30.1  MCHC 33.1  RDW 13.8   ------------------------------------------------------------------------------------------------------------------  Chemistries   Recent Labs Lab 05/07/16 0059  NA 139  K 4.1  CL 102  CO2 30  GLUCOSE 117*  BUN 16  CREATININE 0.64  CALCIUM 11.0*  AST 21  ALT 10*  ALKPHOS 110  BILITOT 0.4   ------------------------------------------------------------------------------------------------------------------ estimated creatinine clearance  is 53.8 mL/min (by C-G formula based on SCr of 0.64 mg/dL). ------------------------------------------------------------------------------------------------------------------ No results for input(s): TSH, T4TOTAL, T3FREE, THYROIDAB in the last 72 hours.  Invalid input(s): FREET3   Coagulation profile No results for input(s): INR, PROTIME in the last 168 hours. ------------------------------------------------------------------------------------------------------------------- No results for input(s): DDIMER in the last 72 hours. -------------------------------------------------------------------------------------------------------------------  Cardiac Enzymes No results for input(s): CKMB, TROPONINI, MYOGLOBIN in the last 168 hours.  Invalid input(s): CK ------------------------------------------------------------------------------------------------------------------ Invalid input(s):  POCBNP  ---------------------------------------------------------------------------------------------------------------  Urinalysis    Component Value Date/Time   COLORURINE YELLOW (A) 05/07/2016 0119   APPEARANCEUR HAZY (A) 05/07/2016 0119   APPEARANCEUR Clear 04/18/2013 1944   LABSPEC 1.020 05/07/2016 0119   LABSPEC 1.012 04/18/2013 1944   PHURINE 5.0 05/07/2016 0119   GLUCOSEU NEGATIVE 05/07/2016 0119   GLUCOSEU Negative 04/18/2013 1944   HGBUR NEGATIVE 05/07/2016 0119   BILIRUBINUR NEGATIVE 05/07/2016 0119   BILIRUBINUR Negative 04/18/2013 1944   KETONESUR 5 (A) 05/07/2016 0119   PROTEINUR NEGATIVE 05/07/2016 0119   NITRITE NEGATIVE 05/07/2016 0119   LEUKOCYTESUR SMALL (A) 05/07/2016 0119   LEUKOCYTESUR Negative 04/18/2013 1944     RADIOLOGY: Dg Chest 1 View  Result Date: 05/07/2016 CLINICAL DATA:  Chest pain and weakness. EXAM: CHEST 1 VIEW COMPARISON:  06/10/2013, 04/18/2013 FINDINGS: Innumerable pulmonary nodules, significantly increased from 06/10/2013 and 04/18/2013. This may represent diffuse metastatic disease. There also is a small right pleural effusion which is new. Moderate cardiomegaly, probably unchanged. Mild right base lung opacity adjacent to the pleural effusion, likely atelectatic. IMPRESSION: 1. Numerous pulmonary nodules, suspicious for hematogenous metastases. This is progressed from 06/10/2013. 2. Unchanged cardiomegaly. 3. Right pleural effusion and adjacent atelectatic appearing lung base opacities. Electronically Signed   By: Andreas Newport M.D.   On: 05/07/2016 02:08   Dg Pelvis 1-2 Views  Result Date: 05/07/2016 CLINICAL DATA:  82 year old female with weakness and pelvic pain. No injury. EXAM: PELVIS - 1-2 VIEW COMPARISON:  CT of the abdomen pelvis dated 04/18/2013 FINDINGS: There is no acute fracture or dislocation. There is advanced osteopenia with osteoarthritic changes of the hips. Lucencies over the femoral heads and neck likely related to  osteopenia. Metastatic disease is much less likely. Partially visualized intramedullary cement in the mid right femoral diaphysis. Hernia repair mesh material noted. The soft tissues are grossly unremarkable. IMPRESSION: No acute fracture or dislocation. Advanced osteopenia with osteoarthritic changes of the hips. Electronically Signed   By: Anner Crete M.D.   On: 05/07/2016 02:11   Ct Chest W Contrast  Result Date: 05/07/2016 CLINICAL DATA:  Hypercalcemia.  Concern for metastatic disease. EXAM: CT CHEST, ABDOMEN, AND PELVIS WITH CONTRAST TECHNIQUE: Multidetector CT imaging of the chest, abdomen and pelvis was performed following the standard protocol during bolus administration of intravenous contrast. CONTRAST:  115mL ISOVUE-300 IOPAMIDOL (ISOVUE-300) INJECTION 61% COMPARISON:  None. FINDINGS: CT CHEST FINDINGS Cardiovascular: There is coronary artery and aortic atherosclerosis. Heart size is within normal limits. Mediastinum/Nodes: There is no definite mediastinal adenopathy. There are multiple masses along margin of the mediastinum, but I suspect that these are actually pleural based. Lungs/Pleura: There are numerous pleural based masses, the largest of which is at the anterior right hilum, measuring 4.7 x 3.7 cm, which encases and occludes the right middle lobe bronchus and abuts the right pulmonary artery and anterior right pulmonary vein. There is an additional large mass of the right lung apex, measuring 3.6 x 4.2 cm. There is a medium-sized right pleural effusion and small left pleural effusion. There are  numerous scattered pulmonary nodules. The largest nodules are in the right upper lobe, measuring up to 2.2 cm. Nodules within the left lung are smaller, measuring up to 1.3 cm. The degree of pleural disease on the left is less than that on the right. In multiple locations, the pleural disease extends into the peri pleural fat. However, I do not see definite chest wall invasion. There is no  extension of the disease and the spinal canal. Musculoskeletal: The mass in the lower outer quadrant of the right breast measures 3.2 x 2.8 cm. CT ABDOMEN PELVIS FINDINGS Hepatobiliary: No focal liver lesions. No biliary dilatation. The gallbladder surgically absent. Pancreas: The head and body of pancreas are atrophic. The tail is unremarkable. No pancreatic ductal dilatation or peripancreatic fluid collection. Spleen: Normal Adrenals/Urinary Tract: The adrenal glands are normal. No hydronephrosis or focal renal mass. Stomach/Bowel: Surgical staples noted within the anterior abdomen. The appendix is not visualized. No dilated small bowel or other evidence of obstruction. No enteric or colonic inflammation. Vascular/Lymphatic: There are multiple abnormal appearing aortocaval lymph nodes measuring up to 1.1 cm. Reproductive: Status post hysterectomy.  No adnexal mass. Other: There are multiple retroperitoneal nodules measuring up to 1.4 x 0.6 cm. Musculoskeletal: Hyperenhancing nodule noted within the left thigh musculature just adjacent to the left iliac wing (series 2, image 92). Bones are osteopenic. There is multilevel lumbar facet arthrosis. Mixed lytic and sclerotic lesions are seen within the T12 vertebral body. There is a lytic lesion within the T11 spinous process. There are other areas of heterogeneous density within the thoracic vertebral column. There are multiple areas of abnormal sclerosis within the ribs. IMPRESSION: 1. Severe bilateral pulmonary and pleural metastatic disease, with dominant right hilar mass which encases and occludes the right middle lobe bronchus and abuts the right pulmonary artery. 2. Multiple peritoneal nodules and enlarged, abnormal appearing aortocaval lymph nodes also consistent with metastatic disease. 3. Multiple abnormal aortocaval lymph nodes measuring up to 1.1 cm, suggesting lymphatic spread of disease. 4. Soft tissue masses of the right axilla and right breast. This  could indicate a primary breast carcinoma versus soft tissue metastatic disease. Additional extraperitoneal soft tissue metastasis adjacent to the left iliac wing. 5. Multifocal osseous metastatic disease. 6. Aortic and coronary artery atherosclerosis. Electronically Signed   By: Ulyses Jarred M.D.   On: 05/07/2016 04:14   Ct Abdomen Pelvis W Contrast  Result Date: 05/07/2016 CLINICAL DATA:  Hypercalcemia.  Concern for metastatic disease. EXAM: CT CHEST, ABDOMEN, AND PELVIS WITH CONTRAST TECHNIQUE: Multidetector CT imaging of the chest, abdomen and pelvis was performed following the standard protocol during bolus administration of intravenous contrast. CONTRAST:  164mL ISOVUE-300 IOPAMIDOL (ISOVUE-300) INJECTION 61% COMPARISON:  None. FINDINGS: CT CHEST FINDINGS Cardiovascular: There is coronary artery and aortic atherosclerosis. Heart size is within normal limits. Mediastinum/Nodes: There is no definite mediastinal adenopathy. There are multiple masses along margin of the mediastinum, but I suspect that these are actually pleural based. Lungs/Pleura: There are numerous pleural based masses, the largest of which is at the anterior right hilum, measuring 4.7 x 3.7 cm, which encases and occludes the right middle lobe bronchus and abuts the right pulmonary artery and anterior right pulmonary vein. There is an additional large mass of the right lung apex, measuring 3.6 x 4.2 cm. There is a medium-sized right pleural effusion and small left pleural effusion. There are numerous scattered pulmonary nodules. The largest nodules are in the right upper lobe, measuring up to 2.2 cm. Nodules within the  left lung are smaller, measuring up to 1.3 cm. The degree of pleural disease on the left is less than that on the right. In multiple locations, the pleural disease extends into the peri pleural fat. However, I do not see definite chest wall invasion. There is no extension of the disease and the spinal canal. Musculoskeletal:  The mass in the lower outer quadrant of the right breast measures 3.2 x 2.8 cm. CT ABDOMEN PELVIS FINDINGS Hepatobiliary: No focal liver lesions. No biliary dilatation. The gallbladder surgically absent. Pancreas: The head and body of pancreas are atrophic. The tail is unremarkable. No pancreatic ductal dilatation or peripancreatic fluid collection. Spleen: Normal Adrenals/Urinary Tract: The adrenal glands are normal. No hydronephrosis or focal renal mass. Stomach/Bowel: Surgical staples noted within the anterior abdomen. The appendix is not visualized. No dilated small bowel or other evidence of obstruction. No enteric or colonic inflammation. Vascular/Lymphatic: There are multiple abnormal appearing aortocaval lymph nodes measuring up to 1.1 cm. Reproductive: Status post hysterectomy.  No adnexal mass. Other: There are multiple retroperitoneal nodules measuring up to 1.4 x 0.6 cm. Musculoskeletal: Hyperenhancing nodule noted within the left thigh musculature just adjacent to the left iliac wing (series 2, image 92). Bones are osteopenic. There is multilevel lumbar facet arthrosis. Mixed lytic and sclerotic lesions are seen within the T12 vertebral body. There is a lytic lesion within the T11 spinous process. There are other areas of heterogeneous density within the thoracic vertebral column. There are multiple areas of abnormal sclerosis within the ribs. IMPRESSION: 1. Severe bilateral pulmonary and pleural metastatic disease, with dominant right hilar mass which encases and occludes the right middle lobe bronchus and abuts the right pulmonary artery. 2. Multiple peritoneal nodules and enlarged, abnormal appearing aortocaval lymph nodes also consistent with metastatic disease. 3. Multiple abnormal aortocaval lymph nodes measuring up to 1.1 cm, suggesting lymphatic spread of disease. 4. Soft tissue masses of the right axilla and right breast. This could indicate a primary breast carcinoma versus soft tissue  metastatic disease. Additional extraperitoneal soft tissue metastasis adjacent to the left iliac wing. 5. Multifocal osseous metastatic disease. 6. Aortic and coronary artery atherosclerosis. Electronically Signed   By: Ulyses Jarred M.D.   On: 05/07/2016 04:14    EKG: Orders placed or performed during the hospital encounter of 05/07/16  . ED EKG  . ED EKG    IMPRESSION AND PLAN: 82 year old elderly female patient with history of sleep apnea, spinal stenosis, arthritis, peripheral vascular disease, diabetes mellitus presented to the emergency room with generalized weakness and inability to transfer from wheelchair. Admitting diagnosis 1. Hypercalcemia 2. Multiple pulmonary nodules and right lung mass 3. Peritoneal nodules possible metastatic disease 4. Multifocal osseous lesions probably secondary to metastatic disease 5. Metastatic cancer Treatment plan Admit patient to medical floor IV fluid hydration and follow-up calcium level Oncology consultation Pulmonary consultation Pain management Supportive care.  All the records are reviewed and case discussed with ED provider. Management plans discussed with the patient, family and they are in agreement.  CODE STATUS:DNR Surrogate decision maker : None Code Status History    This patient does not have a recorded code status. Please follow your organizational policy for patients in this situation.    Advance Directive Documentation   Flowsheet Row Most Recent Value  Type of Advance Directive  Out of facility DNR (pink MOST or yellow form)  Pre-existing out of facility DNR order (yellow form or pink MOST form)  Yellow form placed in chart (order not valid  for inpatient use)  "MOST" Form in Place?  No data       TOTAL TIME TAKING CARE OF THIS PATIENT: 55 minutes.    Saundra Shelling M.D on 05/07/2016 at 4:31 AM  Between 7am to 6pm - Pager - [phone]  After 6pm go to [email or web address] - password [email or web address]  [facility]  Office  [phone]  CC: Primary care physician; No primary care provider on file.

## 2016-05-07 NOTE — Consult Note (Signed)
[place]  [phone]) [phone] [phone]) [phone]  ID: Jeanette White OB: [id number]  MR#: [id number]  [id number]  No care team member to display  CHIEF COMPLAINT: Weakness, hypercalcemia, likely metastatic neoplasm.  INTERVAL HISTORY: Patient is an 82 year old female with multiple medical problems who presented to the emergency room with worsening weakness and fatigue. Subsequent workup not only revealed hyper calcium anemia, but multiple pulmonary, bone, breast lesions highly suspicious for metastatic disease. Currently, patient feels improved since admission but not back to her baseline. She continues to be weak and fatigued. She has no neurologic complaints. She denies any recent fevers. She is good appetite and denies weight loss. She does not complain of pain. She has no chest pain or shortness of breath. She denies any nausea, vomiting, constipation, or diarrhea. She has no urinary complaints. Patient otherwise feels well and offers no further specific complaints.  REVIEW OF SYSTEMS:   Review of Systems  Constitutional: Positive for malaise/fatigue. Negative for fever and weight loss.  Respiratory: Negative.  Negative for cough and shortness of breath.   Cardiovascular: Negative.  Negative for chest pain and leg swelling.  Gastrointestinal: Negative.  Negative for abdominal pain.  Genitourinary: Negative.   Musculoskeletal: Negative.   Neurological: Negative.   Psychiatric/Behavioral: Negative.  The patient is not nervous/anxious.     As per HPI. Otherwise, a complete review of systems is negative.  PAST MEDICAL HISTORY: Past Medical History:  Diagnosis Date  . Anginal pain (HCC)    STABLE  . Arthritis   . Asthma    CHRONIC OBSTRUCTIVE  . B12 deficiency   . Complication of anesthesia    SLOW TO WAKE UP  . Coronary artery disease   . Depression    MAJOR  . Diabetes mellitus without complication ([place])   . Edema    HANDS/FEET  . GERD  (gastroesophageal reflux disease)   . HOH (hard of hearing)   . Hypertension   . Myocardial infarction   . Peripheral vascular disease ([place])   . Polyneuropathy ([place])   . Renal anomaly    ARTERY STENOSIS  . Rhinitis, allergic    CHRONIC  . Shoulder disorder    ROTATOR CUFF TEAR  LEFT  . Sleep apnea   . Spinal disorder    STENOSIS    PAST SURGICAL HISTORY: Past Surgical History:  Procedure Laterality Date  . APPENDECTOMY    . CATARACT EXTRACTION W/PHACO Left 12/09/2015   Procedure: CATARACT EXTRACTION PHACO AND INTRAOCULAR LENS PLACEMENT (IOC);  Surgeon: Estill Cotta, MD;  Location: [facility] ORS;  Service: Ophthalmology;  Laterality: Left;  [place] 01:21 AP% 22.8 CDE 31.49 fluid pack lot # [id number]  . CHOLECYSTECTOMY    . CORONARY ANGIOPLASTY     STENT  . CORONARY ARTERY BYPASS GRAFT    . TOTAL KNEE ARTHROPLASTY Bilateral     FAMILY HISTORY: Family History  Problem Relation Age of Onset  . Alzheimer's disease Sister   . Arthritis Sister     ADVANCED DIRECTIVES (Y/N):  @ADVDIR @  HEALTH MAINTENANCE: Social History  Substance Use Topics  . Smoking status: Never Smoker  . Smokeless tobacco: Never Used  . Alcohol use No     Colonoscopy:  PAP:  Bone density:  Lipid panel:  Allergies  Allergen Reactions  . Ace Inhibitors   . Sulfa Antibiotics     Current Facility-Administered Medications  Medication Dose Route Frequency Provider Last Rate Last Dose  . 0.9 %  sodium chloride infusion   Intravenous Continuous  Loletha Grayer, MD 40 mL/hr at 05/07/16 1335    . acetaminophen (TYLENOL) tablet 650 mg  650 mg Oral Q6H PRN Saundra Shelling, MD       Or  . acetaminophen (TYLENOL) suppository 650 mg  650 mg Rectal Q6H PRN Pavan Pyreddy, MD      . clopidogrel (PLAVIX) tablet 75 mg  75 mg Oral Daily Saundra Shelling, MD   75 mg at 05/07/16 0849  . enoxaparin (LOVENOX) injection 40 mg  40 mg Subcutaneous Q24H Saundra Shelling, MD   40 mg at 05/07/16 0850  . FLUoxetine (PROZAC)  capsule 40 mg  40 mg Oral Daily Saundra Shelling, MD   40 mg at 05/07/16 0849  . gabapentin (NEURONTIN) capsule 900 mg  900 mg Oral QHS Pavan Pyreddy, MD      . iopamidol (ISOVUE-M) 41 % intrathecal injection 10 mL  10 mL Intrathecal Once PRN Earleen Newport, MD      . nitroGLYCERIN (NITROSTAT) SL tablet 0.4 mg  0.4 mg Sublingual Q5 min PRN Saundra Shelling, MD      . nystatin (MYCOSTATIN/NYSTOP) topical powder   Topical TID Loletha Grayer, MD      . ondansetron [facility]) tablet 4 mg  4 mg Oral Q6H PRN Saundra Shelling, MD       Or  . ondansetron (ZOFRAN) injection 4 mg  4 mg Intravenous Q6H PRN Pavan Pyreddy, MD      . pantoprazole (PROTONIX) EC tablet 40 mg  40 mg Oral QAC breakfast Saundra Shelling, MD   40 mg at 05/07/16 0849  . pravastatin (PRAVACHOL) tablet 80 mg  80 mg Oral QHS Pavan Pyreddy, MD      . senna-docusate (Senokot-S) tablet 1 tablet  1 tablet Oral QHS PRN Saundra Shelling, MD      . Derrill Memo ON 05/09/2016] Vitamin D (Ergocalciferol) (DRISDOL) capsule 50,000 Units  50,000 Units Oral Q30 days Saundra Shelling, MD      . zolendronic acid (ZOMETA) 4 mg in sodium chloride 0.9 % 100 mL IVPB  4 mg Intravenous Once Lloyd Huger, MD        OBJECTIVE: Vitals:   05/07/16 0637 05/07/16 1349  BP: (!) 135/52 (!) 110/52  Pulse: 66 71  Resp: 17   Temp: 97.5 F (36.4 C) 98 F (36.7 C)     Body mass index is 36.9 kg/m.    ECOG FS:2 - Symptomatic, <50% confined to bed  General: Well-developed, well-nourished, no acute distress. Eyes: Pink conjunctiva, anicteric sclera. HEENT: Normocephalic, moist mucous membranes, clear oropharnyx. Breasts: Palpable right lower outer quadrant breast mass. Lungs: Clear to auscultation bilaterally. Heart: Regular rate and rhythm. No rubs, murmurs, or gallops. Abdomen: Soft, nontender, nondistended. No organomegaly noted, normoactive bowel sounds. Musculoskeletal: No edema, cyanosis, or clubbing. Neuro: Alert, answering all questions appropriately. Cranial  nerves grossly intact. Skin: No rashes or petechiae noted. Psych: Normal affect. Lymphatics: No cervical, calvicular, axillary or inguinal LAD.   LAB RESULTS:  Lab Results  Component Value Date   NA 139 05/07/2016   K 4.1 05/07/2016   CL 102 05/07/2016   CO2 30 05/07/2016   GLUCOSE 117 (H) 05/07/2016   BUN 16 05/07/2016   CREATININE 0.64 05/07/2016   CALCIUM 11.0 (H) 05/07/2016   PROT 6.4 (L) 05/07/2016   ALBUMIN 3.2 (L) 05/07/2016   AST 21 05/07/2016   ALT 10 (L) 05/07/2016   ALKPHOS 110 05/07/2016   BILITOT 0.4 05/07/2016   GFRNONAA >60 05/07/2016   GFRAA >60 05/07/2016  Lab Results  Component Value Date   WBC 6.2 05/07/2016   NEUTROABS 5.9 06/10/2013   HGB 11.4 (L) 05/07/2016   HCT 34.4 (L) 05/07/2016   MCV 91.0 05/07/2016   PLT 232 05/07/2016     STUDIES: Dg Chest 1 View  Result Date: 05/07/2016 CLINICAL DATA:  Chest pain and weakness. EXAM: CHEST 1 VIEW COMPARISON:  06/10/2013, 04/18/2013 FINDINGS: Innumerable pulmonary nodules, significantly increased from 06/10/2013 and 04/18/2013. This may represent diffuse metastatic disease. There also is a small right pleural effusion which is new. Moderate cardiomegaly, probably unchanged. Mild right base lung opacity adjacent to the pleural effusion, likely atelectatic. IMPRESSION: 1. Numerous pulmonary nodules, suspicious for hematogenous metastases. This is progressed from 06/10/2013. 2. Unchanged cardiomegaly. 3. Right pleural effusion and adjacent atelectatic appearing lung base opacities. Electronically Signed   By: Andreas Newport M.D.   On: 05/07/2016 02:08   Dg Pelvis 1-2 Views  Result Date: 05/07/2016 CLINICAL DATA:  82 year old female with weakness and pelvic pain. No injury. EXAM: PELVIS - 1-2 VIEW COMPARISON:  CT of the abdomen pelvis dated 04/18/2013 FINDINGS: There is no acute fracture or dislocation. There is advanced osteopenia with osteoarthritic changes of the hips. Lucencies over the femoral heads  and neck likely related to osteopenia. Metastatic disease is much less likely. Partially visualized intramedullary cement in the mid right femoral diaphysis. Hernia repair mesh material noted. The soft tissues are grossly unremarkable. IMPRESSION: No acute fracture or dislocation. Advanced osteopenia with osteoarthritic changes of the hips. Electronically Signed   By: Anner Crete M.D.   On: 05/07/2016 02:11   Ct Chest W Contrast  Result Date: 05/07/2016 CLINICAL DATA:  Hypercalcemia.  Concern for metastatic disease. EXAM: CT CHEST, ABDOMEN, AND PELVIS WITH CONTRAST TECHNIQUE: Multidetector CT imaging of the chest, abdomen and pelvis was performed following the standard protocol during bolus administration of intravenous contrast. CONTRAST:  140mL ISOVUE-300 IOPAMIDOL (ISOVUE-300) INJECTION 61% COMPARISON:  None. FINDINGS: CT CHEST FINDINGS Cardiovascular: There is coronary artery and aortic atherosclerosis. Heart size is within normal limits. Mediastinum/Nodes: There is no definite mediastinal adenopathy. There are multiple masses along margin of the mediastinum, but I suspect that these are actually pleural based. Lungs/Pleura: There are numerous pleural based masses, the largest of which is at the anterior right hilum, measuring 4.7 x 3.7 cm, which encases and occludes the right middle lobe bronchus and abuts the right pulmonary artery and anterior right pulmonary vein. There is an additional large mass of the right lung apex, measuring 3.6 x 4.2 cm. There is a medium-sized right pleural effusion and small left pleural effusion. There are numerous scattered pulmonary nodules. The largest nodules are in the right upper lobe, measuring up to 2.2 cm. Nodules within the left lung are smaller, measuring up to 1.3 cm. The degree of pleural disease on the left is less than that on the right. In multiple locations, the pleural disease extends into the peri pleural fat. However, I do not see definite chest wall  invasion. There is no extension of the disease and the spinal canal. Musculoskeletal: The mass in the lower outer quadrant of the right breast measures 3.2 x 2.8 cm. CT ABDOMEN PELVIS FINDINGS Hepatobiliary: No focal liver lesions. No biliary dilatation. The gallbladder surgically absent. Pancreas: The head and body of pancreas are atrophic. The tail is unremarkable. No pancreatic ductal dilatation or peripancreatic fluid collection. Spleen: Normal Adrenals/Urinary Tract: The adrenal glands are normal. No hydronephrosis or focal renal mass. Stomach/Bowel: Surgical staples noted within the  anterior abdomen. The appendix is not visualized. No dilated small bowel or other evidence of obstruction. No enteric or colonic inflammation. Vascular/Lymphatic: There are multiple abnormal appearing aortocaval lymph nodes measuring up to 1.1 cm. Reproductive: Status post hysterectomy.  No adnexal mass. Other: There are multiple retroperitoneal nodules measuring up to 1.4 x 0.6 cm. Musculoskeletal: Hyperenhancing nodule noted within the left thigh musculature just adjacent to the left iliac wing (series 2, image 92). Bones are osteopenic. There is multilevel lumbar facet arthrosis. Mixed lytic and sclerotic lesions are seen within the T12 vertebral body. There is a lytic lesion within the T11 spinous process. There are other areas of heterogeneous density within the thoracic vertebral column. There are multiple areas of abnormal sclerosis within the ribs. IMPRESSION: 1. Severe bilateral pulmonary and pleural metastatic disease, with dominant right hilar mass which encases and occludes the right middle lobe bronchus and abuts the right pulmonary artery. 2. Multiple peritoneal nodules and enlarged, abnormal appearing aortocaval lymph nodes also consistent with metastatic disease. 3. Multiple abnormal aortocaval lymph nodes measuring up to 1.1 cm, suggesting lymphatic spread of disease. 4. Soft tissue masses of the right axilla and  right breast. This could indicate a primary breast carcinoma versus soft tissue metastatic disease. Additional extraperitoneal soft tissue metastasis adjacent to the left iliac wing. 5. Multifocal osseous metastatic disease. 6. Aortic and coronary artery atherosclerosis. Electronically Signed   By: Ulyses Jarred M.D.   On: 05/07/2016 04:14   Ct Abdomen Pelvis W Contrast  Result Date: 05/07/2016 CLINICAL DATA:  Hypercalcemia.  Concern for metastatic disease. EXAM: CT CHEST, ABDOMEN, AND PELVIS WITH CONTRAST TECHNIQUE: Multidetector CT imaging of the chest, abdomen and pelvis was performed following the standard protocol during bolus administration of intravenous contrast. CONTRAST:  135mL ISOVUE-300 IOPAMIDOL (ISOVUE-300) INJECTION 61% COMPARISON:  None. FINDINGS: CT CHEST FINDINGS Cardiovascular: There is coronary artery and aortic atherosclerosis. Heart size is within normal limits. Mediastinum/Nodes: There is no definite mediastinal adenopathy. There are multiple masses along margin of the mediastinum, but I suspect that these are actually pleural based. Lungs/Pleura: There are numerous pleural based masses, the largest of which is at the anterior right hilum, measuring 4.7 x 3.7 cm, which encases and occludes the right middle lobe bronchus and abuts the right pulmonary artery and anterior right pulmonary vein. There is an additional large mass of the right lung apex, measuring 3.6 x 4.2 cm. There is a medium-sized right pleural effusion and small left pleural effusion. There are numerous scattered pulmonary nodules. The largest nodules are in the right upper lobe, measuring up to 2.2 cm. Nodules within the left lung are smaller, measuring up to 1.3 cm. The degree of pleural disease on the left is less than that on the right. In multiple locations, the pleural disease extends into the peri pleural fat. However, I do not see definite chest wall invasion. There is no extension of the disease and the spinal  canal. Musculoskeletal: The mass in the lower outer quadrant of the right breast measures 3.2 x 2.8 cm. CT ABDOMEN PELVIS FINDINGS Hepatobiliary: No focal liver lesions. No biliary dilatation. The gallbladder surgically absent. Pancreas: The head and body of pancreas are atrophic. The tail is unremarkable. No pancreatic ductal dilatation or peripancreatic fluid collection. Spleen: Normal Adrenals/Urinary Tract: The adrenal glands are normal. No hydronephrosis or focal renal mass. Stomach/Bowel: Surgical staples noted within the anterior abdomen. The appendix is not visualized. No dilated small bowel or other evidence of obstruction. No enteric or colonic inflammation.  Vascular/Lymphatic: There are multiple abnormal appearing aortocaval lymph nodes measuring up to 1.1 cm. Reproductive: Status post hysterectomy.  No adnexal mass. Other: There are multiple retroperitoneal nodules measuring up to 1.4 x 0.6 cm. Musculoskeletal: Hyperenhancing nodule noted within the left thigh musculature just adjacent to the left iliac wing (series 2, image 92). Bones are osteopenic. There is multilevel lumbar facet arthrosis. Mixed lytic and sclerotic lesions are seen within the T12 vertebral body. There is a lytic lesion within the T11 spinous process. There are other areas of heterogeneous density within the thoracic vertebral column. There are multiple areas of abnormal sclerosis within the ribs. IMPRESSION: 1. Severe bilateral pulmonary and pleural metastatic disease, with dominant right hilar mass which encases and occludes the right middle lobe bronchus and abuts the right pulmonary artery. 2. Multiple peritoneal nodules and enlarged, abnormal appearing aortocaval lymph nodes also consistent with metastatic disease. 3. Multiple abnormal aortocaval lymph nodes measuring up to 1.1 cm, suggesting lymphatic spread of disease. 4. Soft tissue masses of the right axilla and right breast. This could indicate a primary breast carcinoma  versus soft tissue metastatic disease. Additional extraperitoneal soft tissue metastasis adjacent to the left iliac wing. 5. Multifocal osseous metastatic disease. 6. Aortic and coronary artery atherosclerosis. Electronically Signed   By: Ulyses Jarred M.D.   On: 05/07/2016 04:14    ASSESSMENT: Weakness, hypercalcemia, likely metastatic neoplasm.  PLAN:    1. Likely metastatic neoplasm: CT scan results reviewed independently and reported as above. Although patient has a dominant lung mass, given her right breast mass with right axillary lymphadenopathy this is highly suspicious for underlying metastatic breast cancer. It is possible patient has a second primary, but she is a nonsmoker making this less likely. Ultimately, she may require at least one biopsy of her breast mass and possibly a second of her hilar mass to confirm diagnosis. Have ordered ultrasound-guided biopsy of breast mass. Recommend consult pulmonology for bronchoscopy. CA-27-29 is pending. 2. Hypercalcemia: Likely secondary to underlying malignancy. Patient is actively receiving IV fluids. 4 mg IV Zometa has also been ordered. 3. Weakness: Multifactorial, monitor.  Appreciate consult, will follow.  Lloyd Huger, MD   05/07/2016 2:02 PM

## 2016-05-07 NOTE — ED Notes (Signed)
Patient transported to CT 

## 2016-05-07 NOTE — Consult Note (Signed)
Pulmonary Critical Care  Initial Consult Note   Jeanette White [id number] DOB: 1928-04-04 DOA: 05/07/2016  Referring physician: Lanier Prude, MD PCP: No primary care provider on file.   Chief Complaint: Lung Mass  HPI: Jeanette White is a 82 y.o. female with multiple medical problems presented to the hospital with increased weakness. Patient has not been able to move about even though she is limited to begin with. Patient had a CT done which showed multiple areas pulmonary nodules and also she had breast nodules along with peritoneal nodules. Patient was also noted to have a axillary mass and has been seen by oncology for possible metastatic breast cancer. She states that she has not had a Mammogram in several years. She states that she has never smoked. Patient has worked in the Physicist, medical as a Copywriter, advertising   Review of Systems:  ROS performed and is unremarkable other than noted in HPI  Past Medical History:  Diagnosis Date  . Anginal pain (HCC)    STABLE  . Arthritis   . Asthma    CHRONIC OBSTRUCTIVE  . B12 deficiency   . Complication of anesthesia    SLOW TO WAKE UP  . Coronary artery disease   . Depression    MAJOR  . Diabetes mellitus without complication ([place])   . Edema    HANDS/FEET  . GERD (gastroesophageal reflux disease)   . HOH (hard of hearing)   . Hypertension   . Myocardial infarction   . Peripheral vascular disease ([place])   . Polyneuropathy ([place])   . Renal anomaly    ARTERY STENOSIS  . Rhinitis, allergic    CHRONIC  . Shoulder disorder    ROTATOR CUFF TEAR  LEFT  . Sleep apnea   . Spinal disorder    STENOSIS   Past Surgical History:  Procedure Laterality Date  . APPENDECTOMY    . CATARACT EXTRACTION W/PHACO Left 12/09/2015   Procedure: CATARACT EXTRACTION PHACO AND INTRAOCULAR LENS PLACEMENT (IOC);  Surgeon: Estill Cotta, MD;  Location: [facility] ORS;  Service: Ophthalmology;  Laterality: Left;  [place] 01:21 AP% 22.8 CDE 31.49 fluid pack lot #  [id number]  . CHOLECYSTECTOMY    . CORONARY ANGIOPLASTY     STENT  . CORONARY ARTERY BYPASS GRAFT    . TOTAL KNEE ARTHROPLASTY Bilateral    Social History:  reports that she has never smoked. She has never used smokeless tobacco. She reports that she does not drink alcohol or use drugs.  Allergies  Allergen Reactions  . Ace Inhibitors   . Sulfa Antibiotics     Family History  Problem Relation Age of Onset  . Alzheimer's disease Sister   . Arthritis Sister     Prior to Admission medications   Medication Sig Start Date End Date Taking? Authorizing Provider  clopidogrel (PLAVIX) 75 MG tablet Take 75 mg by mouth daily.   Yes Historical Provider, MD  FLUoxetine (PROZAC) 40 MG capsule Take 40 mg by mouth daily.   Yes Historical Provider, MD  furosemide (LASIX) 20 MG tablet Take 10 mg by mouth daily.   Yes Historical Provider, MD  pantoprazole (PROTONIX) 40 MG tablet Take 40 mg by mouth daily.    Yes Historical Provider, MD  potassium chloride (K-DUR,KLOR-CON) 10 MEQ tablet Take 10 mEq by mouth 3 (three) times daily. 2 AM 1 HS   Yes Historical Provider, MD  pravastatin (PRAVACHOL) 80 MG tablet Take 80 mg by mouth at bedtime.    Yes Historical Provider,  MD  pregabalin (LYRICA) 150 MG capsule Take 150 mg by mouth at bedtime.   Yes Historical Provider, MD  ranitidine (ZANTAC) 150 MG tablet Take 150 mg by mouth daily.   Yes Historical Provider, MD  valsartan-hydrochlorothiazide (DIOVAN-HCT) 80-12.5 MG tablet Take 1 tablet by mouth.   Yes Historical Provider, MD   Physical Exam: Vitals:   05/07/16 0500 05/07/16 0530 05/07/16 0637 05/07/16 1349  BP: (!) 101/54 94/60 (!) 135/52 (!) 110/52  Pulse: (!) 52 62 66 71  Resp: 13 12 17    Temp:   97.5 F (36.4 C) 98 F (36.7 C)  TempSrc:   Oral Oral  SpO2: 94% 98% 100% 99%  Weight:   97.5 kg (215 lb)   Height:   5\' 4"  (1.626 m)     Wt Readings from Last 3 Encounters:  05/07/16 97.5 kg (215 lb)  12/09/15 106.1 kg (234 lb)  12/07/15 106.1 kg  (234 lb)    General:  Appears calm and comfortable Eyes: PERRL, normal lids, irises & conjunctiva ENT: grossly normal hearing, lips & tongue Neck: no LAD, masses or thyromegaly Cardiovascular: RRR, no m/r/g. No LE edema. Respiratory: CTA bilaterally, no w/r/r. Normal respiratory effort. Abdomen: soft, nontender Skin: no rash or induration seen on limited exam Musculoskeletal: grossly normal tone BUE/BLE Psychiatric: grossly normal mood and affect Neurologic: grossly non-focal.          Labs on Admission:  Basic Metabolic Panel:  Recent Labs Lab 05/07/16 0059  NA 139  K 4.1  CL 102  CO2 30  GLUCOSE 117*  BUN 16  CREATININE 0.64  CALCIUM 11.0*   Liver Function Tests:  Recent Labs Lab 05/07/16 0059  AST 21  ALT 10*  ALKPHOS 110  BILITOT 0.4  PROT 6.4*  ALBUMIN 3.2*   No results for input(s): LIPASE, AMYLASE in the last 168 hours. No results for input(s): AMMONIA in the last 168 hours. CBC:  Recent Labs Lab 05/07/16 0059  WBC 6.2  HGB 11.4*  HCT 34.4*  MCV 91.0  PLT 232   Cardiac Enzymes: No results for input(s): CKTOTAL, CKMB, CKMBINDEX, TROPONINI in the last 168 hours.  BNP (last 3 results) No results for input(s): BNP in the last 8760 hours.  ProBNP (last 3 results) No results for input(s): PROBNP in the last 8760 hours.  CBG: No results for input(s): GLUCAP in the last 168 hours.  Radiological Exams on Admission: Dg Chest 1 View  Result Date: 05/07/2016 CLINICAL DATA:  Chest pain and weakness. EXAM: CHEST 1 VIEW COMPARISON:  06/10/2013, 04/18/2013 FINDINGS: Innumerable pulmonary nodules, significantly increased from 06/10/2013 and 04/18/2013. This may represent diffuse metastatic disease. There also is a small right pleural effusion which is new. Moderate cardiomegaly, probably unchanged. Mild right base lung opacity adjacent to the pleural effusion, likely atelectatic. IMPRESSION: 1. Numerous pulmonary nodules, suspicious for hematogenous  metastases. This is progressed from 06/10/2013. 2. Unchanged cardiomegaly. 3. Right pleural effusion and adjacent atelectatic appearing lung base opacities. Electronically Signed   By: Andreas Newport M.D.   On: 05/07/2016 02:08   Dg Pelvis 1-2 Views  Result Date: 05/07/2016 CLINICAL DATA:  82 year old female with weakness and pelvic pain. No injury. EXAM: PELVIS - 1-2 VIEW COMPARISON:  CT of the abdomen pelvis dated 04/18/2013 FINDINGS: There is no acute fracture or dislocation. There is advanced osteopenia with osteoarthritic changes of the hips. Lucencies over the femoral heads and neck likely related to osteopenia. Metastatic disease is much less likely. Partially visualized intramedullary cement  in the mid right femoral diaphysis. Hernia repair mesh material noted. The soft tissues are grossly unremarkable. IMPRESSION: No acute fracture or dislocation. Advanced osteopenia with osteoarthritic changes of the hips. Electronically Signed   By: Anner Crete M.D.   On: 05/07/2016 02:11   Ct Chest W Contrast  Result Date: 05/07/2016 CLINICAL DATA:  Hypercalcemia.  Concern for metastatic disease. EXAM: CT CHEST, ABDOMEN, AND PELVIS WITH CONTRAST TECHNIQUE: Multidetector CT imaging of the chest, abdomen and pelvis was performed following the standard protocol during bolus administration of intravenous contrast. CONTRAST:  152mL ISOVUE-300 IOPAMIDOL (ISOVUE-300) INJECTION 61% COMPARISON:  None. FINDINGS: CT CHEST FINDINGS Cardiovascular: There is coronary artery and aortic atherosclerosis. Heart size is within normal limits. Mediastinum/Nodes: There is no definite mediastinal adenopathy. There are multiple masses along margin of the mediastinum, but I suspect that these are actually pleural based. Lungs/Pleura: There are numerous pleural based masses, the largest of which is at the anterior right hilum, measuring 4.7 x 3.7 cm, which encases and occludes the right middle lobe bronchus and abuts the right  pulmonary artery and anterior right pulmonary vein. There is an additional large mass of the right lung apex, measuring 3.6 x 4.2 cm. There is a medium-sized right pleural effusion and small left pleural effusion. There are numerous scattered pulmonary nodules. The largest nodules are in the right upper lobe, measuring up to 2.2 cm. Nodules within the left lung are smaller, measuring up to 1.3 cm. The degree of pleural disease on the left is less than that on the right. In multiple locations, the pleural disease extends into the peri pleural fat. However, I do not see definite chest wall invasion. There is no extension of the disease and the spinal canal. Musculoskeletal: The mass in the lower outer quadrant of the right breast measures 3.2 x 2.8 cm. CT ABDOMEN PELVIS FINDINGS Hepatobiliary: No focal liver lesions. No biliary dilatation. The gallbladder surgically absent. Pancreas: The head and body of pancreas are atrophic. The tail is unremarkable. No pancreatic ductal dilatation or peripancreatic fluid collection. Spleen: Normal Adrenals/Urinary Tract: The adrenal glands are normal. No hydronephrosis or focal renal mass. Stomach/Bowel: Surgical staples noted within the anterior abdomen. The appendix is not visualized. No dilated small bowel or other evidence of obstruction. No enteric or colonic inflammation. Vascular/Lymphatic: There are multiple abnormal appearing aortocaval lymph nodes measuring up to 1.1 cm. Reproductive: Status post hysterectomy.  No adnexal mass. Other: There are multiple retroperitoneal nodules measuring up to 1.4 x 0.6 cm. Musculoskeletal: Hyperenhancing nodule noted within the left thigh musculature just adjacent to the left iliac wing (series 2, image 92). Bones are osteopenic. There is multilevel lumbar facet arthrosis. Mixed lytic and sclerotic lesions are seen within the T12 vertebral body. There is a lytic lesion within the T11 spinous process. There are other areas of heterogeneous  density within the thoracic vertebral column. There are multiple areas of abnormal sclerosis within the ribs. IMPRESSION: 1. Severe bilateral pulmonary and pleural metastatic disease, with dominant right hilar mass which encases and occludes the right middle lobe bronchus and abuts the right pulmonary artery. 2. Multiple peritoneal nodules and enlarged, abnormal appearing aortocaval lymph nodes also consistent with metastatic disease. 3. Multiple abnormal aortocaval lymph nodes measuring up to 1.1 cm, suggesting lymphatic spread of disease. 4. Soft tissue masses of the right axilla and right breast. This could indicate a primary breast carcinoma versus soft tissue metastatic disease. Additional extraperitoneal soft tissue metastasis adjacent to the left iliac wing. 5. Multifocal  osseous metastatic disease. 6. Aortic and coronary artery atherosclerosis. Electronically Signed   By: Ulyses Jarred M.D.   On: 05/07/2016 04:14   Ct Abdomen Pelvis W Contrast  Result Date: 05/07/2016 CLINICAL DATA:  Hypercalcemia.  Concern for metastatic disease. EXAM: CT CHEST, ABDOMEN, AND PELVIS WITH CONTRAST TECHNIQUE: Multidetector CT imaging of the chest, abdomen and pelvis was performed following the standard protocol during bolus administration of intravenous contrast. CONTRAST:  17mL ISOVUE-300 IOPAMIDOL (ISOVUE-300) INJECTION 61% COMPARISON:  None. FINDINGS: CT CHEST FINDINGS Cardiovascular: There is coronary artery and aortic atherosclerosis. Heart size is within normal limits. Mediastinum/Nodes: There is no definite mediastinal adenopathy. There are multiple masses along margin of the mediastinum, but I suspect that these are actually pleural based. Lungs/Pleura: There are numerous pleural based masses, the largest of which is at the anterior right hilum, measuring 4.7 x 3.7 cm, which encases and occludes the right middle lobe bronchus and abuts the right pulmonary artery and anterior right pulmonary vein. There is an  additional large mass of the right lung apex, measuring 3.6 x 4.2 cm. There is a medium-sized right pleural effusion and small left pleural effusion. There are numerous scattered pulmonary nodules. The largest nodules are in the right upper lobe, measuring up to 2.2 cm. Nodules within the left lung are smaller, measuring up to 1.3 cm. The degree of pleural disease on the left is less than that on the right. In multiple locations, the pleural disease extends into the peri pleural fat. However, I do not see definite chest wall invasion. There is no extension of the disease and the spinal canal. Musculoskeletal: The mass in the lower outer quadrant of the right breast measures 3.2 x 2.8 cm. CT ABDOMEN PELVIS FINDINGS Hepatobiliary: No focal liver lesions. No biliary dilatation. The gallbladder surgically absent. Pancreas: The head and body of pancreas are atrophic. The tail is unremarkable. No pancreatic ductal dilatation or peripancreatic fluid collection. Spleen: Normal Adrenals/Urinary Tract: The adrenal glands are normal. No hydronephrosis or focal renal mass. Stomach/Bowel: Surgical staples noted within the anterior abdomen. The appendix is not visualized. No dilated small bowel or other evidence of obstruction. No enteric or colonic inflammation. Vascular/Lymphatic: There are multiple abnormal appearing aortocaval lymph nodes measuring up to 1.1 cm. Reproductive: Status post hysterectomy.  No adnexal mass. Other: There are multiple retroperitoneal nodules measuring up to 1.4 x 0.6 cm. Musculoskeletal: Hyperenhancing nodule noted within the left thigh musculature just adjacent to the left iliac wing (series 2, image 92). Bones are osteopenic. There is multilevel lumbar facet arthrosis. Mixed lytic and sclerotic lesions are seen within the T12 vertebral body. There is a lytic lesion within the T11 spinous process. There are other areas of heterogeneous density within the thoracic vertebral column. There are  multiple areas of abnormal sclerosis within the ribs. IMPRESSION: 1. Severe bilateral pulmonary and pleural metastatic disease, with dominant right hilar mass which encases and occludes the right middle lobe bronchus and abuts the right pulmonary artery. 2. Multiple peritoneal nodules and enlarged, abnormal appearing aortocaval lymph nodes also consistent with metastatic disease. 3. Multiple abnormal aortocaval lymph nodes measuring up to 1.1 cm, suggesting lymphatic spread of disease. 4. Soft tissue masses of the right axilla and right breast. This could indicate a primary breast carcinoma versus soft tissue metastatic disease. Additional extraperitoneal soft tissue metastasis adjacent to the left iliac wing. 5. Multifocal osseous metastatic disease. 6. Aortic and coronary artery atherosclerosis. Electronically Signed   By: Ulyses Jarred M.D.   On:  05/07/2016 04:14      EKG: Independently reviewed.  Assessment/Plan Principal Problem:   Hypercalcemia Active Problems:   Pulmonary nodules/lesions, multiple   1. Lung Mass -agree with oncology assessment -will consider bronchoscopy if still deemed neccassary after the breast is biopsied -with degree of lesions noted it is likely from the breast  2. Breast Mass -breast biopsy has been ordered per oncology  3. Hypercalemia -secondary to suspected malignancy -continue with hydration as ordered    Code Status: DNR Family Communication: In room Disposition Plan: home  Time spent: 64min    I have personally obtained a history, examined the patient, evaluated laboratory and imaging results, formulated the assessment and plan and placed orders.  The Patient requires high complexity decision making for assessment and support.    Allyne Gee, MD [facility] Pulmonary Critical Care Medicine Sleep Medicine

## 2016-05-08 LAB — BASIC METABOLIC PANEL
ANION GAP: 5 (ref 5–15)
BUN: 16 mg/dL (ref 6–20)
CALCIUM: 9.7 mg/dL (ref 8.9–10.3)
CO2: 31 mmol/L (ref 22–32)
Chloride: 103 mmol/L (ref 101–111)
Creatinine, Ser: 0.63 mg/dL (ref 0.44–1.00)
Glucose, Bld: 137 mg/dL — ABNORMAL HIGH (ref 65–99)
POTASSIUM: 3.5 mmol/L (ref 3.5–5.1)
Sodium: 139 mmol/L (ref 135–145)

## 2016-05-08 LAB — CANCER ANTIGEN 27.29: CA 27.29: 465 U/mL — AB (ref 0.0–38.6)

## 2016-05-08 MED ORDER — ORAL CARE MOUTH RINSE
15.0000 mL | Freq: Two times a day (BID) | OROMUCOSAL | Status: DC
  Administered 2016-05-08 – 2016-05-11 (×7): 15 mL via OROMUCOSAL

## 2016-05-08 MED ORDER — FUROSEMIDE 10 MG/ML IJ SOLN
40.0000 mg | Freq: Once | INTRAMUSCULAR | Status: AC
  Administered 2016-05-08: 40 mg via INTRAVENOUS
  Filled 2016-05-08: qty 4

## 2016-05-08 MED ORDER — POLYETHYLENE GLYCOL 3350 17 G PO PACK
17.0000 g | PACK | Freq: Every day | ORAL | Status: DC
  Administered 2016-05-08 – 2016-05-11 (×4): 17 g via ORAL
  Filled 2016-05-08 (×4): qty 1

## 2016-05-08 NOTE — Progress Notes (Signed)
Patient ID: Jeanette White, female   DOB: Oct 01, 1927, 82 y.o.   MRN: [id number]  [facility] PROGRESS NOTE  Jeanette White [id number] DOB: 06-Oct-1927 DOA: 05/07/2016 PCP: No primary care provider on file.  HPI/Subjective:   Objective: Vitals:   05/07/16 2038 05/08/16 0444  BP: (!) 110/37 (!) 120/40  Pulse: 78 72  Resp: (!) 24 (!) 24  Temp: 98.3 F (36.8 C) 98.5 F (36.9 C)    Filed Weights   05/07/16 0054 05/07/16 0637  Weight: 97.1 kg (214 lb) 97.5 kg (215 lb)    ROS: Review of Systems  Constitutional: Negative for chills and fever.  Eyes: Negative for blurred vision.  Respiratory: Positive for shortness of breath. Negative for cough.   Cardiovascular: Negative for chest pain.  Gastrointestinal: Negative for abdominal pain, constipation, diarrhea, nausea and vomiting.  Genitourinary: Negative for dysuria.  Musculoskeletal: Negative for joint pain.  Neurological: Positive for weakness. Negative for dizziness and headaches.   Exam: Physical Exam  Constitutional: She is oriented to person, place, and time.  HENT:  Nose: No mucosal edema.  Mouth/Throat: No oropharyngeal exudate or posterior oropharyngeal edema.  Eyes: Conjunctivae, EOM and lids are normal. Pupils are equal, round, and reactive to light.  Neck: No JVD present. Carotid bruit is not present. No edema present. No thyroid mass and no thyromegaly present.  Cardiovascular: S1 normal and S2 normal.  Exam reveals no gallop.   No murmur heard. Pulses:      Dorsalis pedis pulses are 2+ on the right side, and 2+ on the left side.  Respiratory: No respiratory distress. She has decreased breath sounds in the right lower field and the left lower field. She has no wheezes. She has no rhonchi. She has no rales.  GI: Soft. Bowel sounds are normal. There is no tenderness.  Musculoskeletal:       Right ankle: She exhibits swelling.       Left ankle: She exhibits swelling.  Lymphadenopathy:    She has no  cervical adenopathy.  Neurological: She is alert and oriented to person, place, and time. No cranial nerve deficit.  Skin: Skin is warm. No rash noted. Nails show no clubbing.  Psychiatric: She has a normal mood and affect.      Data Reviewed: Basic Metabolic Panel:  Recent Labs Lab 05/07/16 0059 05/08/16 0755  NA 139 139  K 4.1 3.5  CL 102 103  CO2 30 31  GLUCOSE 117* 137*  BUN 16 16  CREATININE 0.64 0.63  CALCIUM 11.0* 9.7   Liver Function Tests:  Recent Labs Lab 05/07/16 0059  AST 21  ALT 10*  ALKPHOS 110  BILITOT 0.4  PROT 6.4*  ALBUMIN 3.2*   CBC:  Recent Labs Lab 05/07/16 0059  WBC 6.2  HGB 11.4*  HCT 34.4*  MCV 91.0  PLT 232     Studies: Dg Chest 1 View  Result Date: 05/07/2016 CLINICAL DATA:  Chest pain and weakness. EXAM: CHEST 1 VIEW COMPARISON:  06/10/2013, 04/18/2013 FINDINGS: Innumerable pulmonary nodules, significantly increased from 06/10/2013 and 04/18/2013. This may represent diffuse metastatic disease. There also is a small right pleural effusion which is new. Moderate cardiomegaly, probably unchanged. Mild right base lung opacity adjacent to the pleural effusion, likely atelectatic. IMPRESSION: 1. Numerous pulmonary nodules, suspicious for hematogenous metastases. This is progressed from 06/10/2013. 2. Unchanged cardiomegaly. 3. Right pleural effusion and adjacent atelectatic appearing lung base opacities. Electronically Signed   By: Andreas Newport M.D.   On: 05/07/2016  02:08   Dg Pelvis 1-2 Views  Result Date: 05/07/2016 CLINICAL DATA:  82 year old female with weakness and pelvic pain. No injury. EXAM: PELVIS - 1-2 VIEW COMPARISON:  CT of the abdomen pelvis dated 04/18/2013 FINDINGS: There is no acute fracture or dislocation. There is advanced osteopenia with osteoarthritic changes of the hips. Lucencies over the femoral heads and neck likely related to osteopenia. Metastatic disease is much less likely. Partially visualized  intramedullary cement in the mid right femoral diaphysis. Hernia repair mesh material noted. The soft tissues are grossly unremarkable. IMPRESSION: No acute fracture or dislocation. Advanced osteopenia with osteoarthritic changes of the hips. Electronically Signed   By: Anner Crete M.D.   On: 05/07/2016 02:11   Ct Chest W Contrast  Result Date: 05/07/2016 CLINICAL DATA:  Hypercalcemia.  Concern for metastatic disease. EXAM: CT CHEST, ABDOMEN, AND PELVIS WITH CONTRAST TECHNIQUE: Multidetector CT imaging of the chest, abdomen and pelvis was performed following the standard protocol during bolus administration of intravenous contrast. CONTRAST:  113mL ISOVUE-300 IOPAMIDOL (ISOVUE-300) INJECTION 61% COMPARISON:  None. FINDINGS: CT CHEST FINDINGS Cardiovascular: There is coronary artery and aortic atherosclerosis. Heart size is within normal limits. Mediastinum/Nodes: There is no definite mediastinal adenopathy. There are multiple masses along margin of the mediastinum, but I suspect that these are actually pleural based. Lungs/Pleura: There are numerous pleural based masses, the largest of which is at the anterior right hilum, measuring 4.7 x 3.7 cm, which encases and occludes the right middle lobe bronchus and abuts the right pulmonary artery and anterior right pulmonary vein. There is an additional large mass of the right lung apex, measuring 3.6 x 4.2 cm. There is a medium-sized right pleural effusion and small left pleural effusion. There are numerous scattered pulmonary nodules. The largest nodules are in the right upper lobe, measuring up to 2.2 cm. Nodules within the left lung are smaller, measuring up to 1.3 cm. The degree of pleural disease on the left is less than that on the right. In multiple locations, the pleural disease extends into the peri pleural fat. However, I do not see definite chest wall invasion. There is no extension of the disease and the spinal canal. Musculoskeletal: The mass in the  lower outer quadrant of the right breast measures 3.2 x 2.8 cm. CT ABDOMEN PELVIS FINDINGS Hepatobiliary: No focal liver lesions. No biliary dilatation. The gallbladder surgically absent. Pancreas: The head and body of pancreas are atrophic. The tail is unremarkable. No pancreatic ductal dilatation or peripancreatic fluid collection. Spleen: Normal Adrenals/Urinary Tract: The adrenal glands are normal. No hydronephrosis or focal renal mass. Stomach/Bowel: Surgical staples noted within the anterior abdomen. The appendix is not visualized. No dilated small bowel or other evidence of obstruction. No enteric or colonic inflammation. Vascular/Lymphatic: There are multiple abnormal appearing aortocaval lymph nodes measuring up to 1.1 cm. Reproductive: Status post hysterectomy.  No adnexal mass. Other: There are multiple retroperitoneal nodules measuring up to 1.4 x 0.6 cm. Musculoskeletal: Hyperenhancing nodule noted within the left thigh musculature just adjacent to the left iliac wing (series 2, image 92). Bones are osteopenic. There is multilevel lumbar facet arthrosis. Mixed lytic and sclerotic lesions are seen within the T12 vertebral body. There is a lytic lesion within the T11 spinous process. There are other areas of heterogeneous density within the thoracic vertebral column. There are multiple areas of abnormal sclerosis within the ribs. IMPRESSION: 1. Severe bilateral pulmonary and pleural metastatic disease, with dominant right hilar mass which encases and occludes the right middle  lobe bronchus and abuts the right pulmonary artery. 2. Multiple peritoneal nodules and enlarged, abnormal appearing aortocaval lymph nodes also consistent with metastatic disease. 3. Multiple abnormal aortocaval lymph nodes measuring up to 1.1 cm, suggesting lymphatic spread of disease. 4. Soft tissue masses of the right axilla and right breast. This could indicate a primary breast carcinoma versus soft tissue metastatic disease.  Additional extraperitoneal soft tissue metastasis adjacent to the left iliac wing. 5. Multifocal osseous metastatic disease. 6. Aortic and coronary artery atherosclerosis. Electronically Signed   By: Ulyses Jarred M.D.   On: 05/07/2016 04:14   Ct Abdomen Pelvis W Contrast  Result Date: 05/07/2016 CLINICAL DATA:  Hypercalcemia.  Concern for metastatic disease. EXAM: CT CHEST, ABDOMEN, AND PELVIS WITH CONTRAST TECHNIQUE: Multidetector CT imaging of the chest, abdomen and pelvis was performed following the standard protocol during bolus administration of intravenous contrast. CONTRAST:  130mL ISOVUE-300 IOPAMIDOL (ISOVUE-300) INJECTION 61% COMPARISON:  None. FINDINGS: CT CHEST FINDINGS Cardiovascular: There is coronary artery and aortic atherosclerosis. Heart size is within normal limits. Mediastinum/Nodes: There is no definite mediastinal adenopathy. There are multiple masses along margin of the mediastinum, but I suspect that these are actually pleural based. Lungs/Pleura: There are numerous pleural based masses, the largest of which is at the anterior right hilum, measuring 4.7 x 3.7 cm, which encases and occludes the right middle lobe bronchus and abuts the right pulmonary artery and anterior right pulmonary vein. There is an additional large mass of the right lung apex, measuring 3.6 x 4.2 cm. There is a medium-sized right pleural effusion and small left pleural effusion. There are numerous scattered pulmonary nodules. The largest nodules are in the right upper lobe, measuring up to 2.2 cm. Nodules within the left lung are smaller, measuring up to 1.3 cm. The degree of pleural disease on the left is less than that on the right. In multiple locations, the pleural disease extends into the peri pleural fat. However, I do not see definite chest wall invasion. There is no extension of the disease and the spinal canal. Musculoskeletal: The mass in the lower outer quadrant of the right breast measures 3.2 x 2.8 cm.  CT ABDOMEN PELVIS FINDINGS Hepatobiliary: No focal liver lesions. No biliary dilatation. The gallbladder surgically absent. Pancreas: The head and body of pancreas are atrophic. The tail is unremarkable. No pancreatic ductal dilatation or peripancreatic fluid collection. Spleen: Normal Adrenals/Urinary Tract: The adrenal glands are normal. No hydronephrosis or focal renal mass. Stomach/Bowel: Surgical staples noted within the anterior abdomen. The appendix is not visualized. No dilated small bowel or other evidence of obstruction. No enteric or colonic inflammation. Vascular/Lymphatic: There are multiple abnormal appearing aortocaval lymph nodes measuring up to 1.1 cm. Reproductive: Status post hysterectomy.  No adnexal mass. Other: There are multiple retroperitoneal nodules measuring up to 1.4 x 0.6 cm. Musculoskeletal: Hyperenhancing nodule noted within the left thigh musculature just adjacent to the left iliac wing (series 2, image 92). Bones are osteopenic. There is multilevel lumbar facet arthrosis. Mixed lytic and sclerotic lesions are seen within the T12 vertebral body. There is a lytic lesion within the T11 spinous process. There are other areas of heterogeneous density within the thoracic vertebral column. There are multiple areas of abnormal sclerosis within the ribs. IMPRESSION: 1. Severe bilateral pulmonary and pleural metastatic disease, with dominant right hilar mass which encases and occludes the right middle lobe bronchus and abuts the right pulmonary artery. 2. Multiple peritoneal nodules and enlarged, abnormal appearing aortocaval lymph nodes also consistent  with metastatic disease. 3. Multiple abnormal aortocaval lymph nodes measuring up to 1.1 cm, suggesting lymphatic spread of disease. 4. Soft tissue masses of the right axilla and right breast. This could indicate a primary breast carcinoma versus soft tissue metastatic disease. Additional extraperitoneal soft tissue metastasis adjacent to the  left iliac wing. 5. Multifocal osseous metastatic disease. 6. Aortic and coronary artery atherosclerosis. Electronically Signed   By: Ulyses Jarred M.D.   On: 05/07/2016 04:14    Scheduled Meds: . enoxaparin (LOVENOX) injection  40 mg Subcutaneous Q24H  . FLUoxetine  40 mg Oral Daily  . gabapentin  900 mg Oral QHS  . mouth rinse  15 mL Mouth Rinse BID  . nystatin   Topical TID  . pantoprazole  40 mg Oral QAC breakfast  . polyethylene glycol  17 g Oral Daily  . pravastatin  80 mg Oral QHS  . [START ON 05/09/2016] Vitamin D (Ergocalciferol)  50,000 Units Oral Q30 days    Assessment/Plan:  1. Hypercalcemia of malignancy. Improved with Zometa. Stop fluids, one dose of lasix today. 2. Right breast mass and lymphadenopathy, right hilar mass with pleural, pulmonary and peritoneal metastases. Oncology ordered an ultrasound-guided biopsy of the right breast. May also need a bronchoscopy for the right hilar mass. Holding Plavix. 3. Fungal infection under skin folds nystatin topical 3 times a day 4. GERD on Protonix 5. Hyperlipidemia unspecified on pravastatin 6. Depression on fluoxetine 7. Morbid obesity  Code Status:     Code Status Orders        Start     Ordered   05/07/16 0655  Do not attempt resuscitation (DNR)  Continuous    Question Answer Comment  In the event of cardiac or respiratory ARREST Do not call a "code blue"   In the event of cardiac or respiratory ARREST Do not perform Intubation, CPR, defibrillation or ACLS   In the event of cardiac or respiratory ARREST Use medication by any route, position, wound care, and other measures to relive pain and suffering. May use oxygen, suction and manual treatment of airway obstruction as needed for comfort.      05/07/16 0654    Code Status History    Date Active Date Inactive Code Status Order ID Comments User Context   This patient has a current code status but no historical code status.    Advance Directive Documentation    Flowsheet Row Most Recent Value  Type of Advance Directive  Out of facility DNR (pink MOST or yellow form)  Pre-existing out of facility DNR order (yellow form or pink MOST form)  Yellow form placed in chart (order not valid for inpatient use)  "MOST" Form in Place?  No data     Family Communication: Spoke with son on phone today Disposition Plan: TBD  Consultants:  Oncology  Pulmonary  Time spent: 24 minutes   [place], [place]

## 2016-05-08 NOTE — Care Management Note (Signed)
Case Management Note  Patient Details  Name: Macayla Leeds MRN: [id number] Date of Birth: 1928/03/04  Subjective/Objective:         [id number] Ms Nianna Cimorelli with metastatic breast cancer is followed by [facility]. Sharita from [facility] reports that the [facility] Team plans to have a family meeting on Tuesday 05/10/16 to discuss possible placement options. Ms Dillashaw currently lives alone, is wheelchair bound, and is unable to care for herself. Out of town family is expected to be available for a conference during their Christmas week visit to see Ms Alias. Ms Chism's PCP is Deneise Lever, NP, at [facility]. Case Management will follow for discharge planning.           Action/Plan:   Expected Discharge Date:                  Expected Discharge Plan:     In-House Referral:     Discharge planning Services     Post Acute Care Choice:    Choice offered to:     DME Arranged:    DME Agency:     HH Arranged:    HH Agency:     Status of Service:     If discussed at [employer] of Stay Meetings, dates discussed:    Additional Comments:  Mamie Diiorio A, RN 05/08/2016, 1:40 PM

## 2016-05-08 NOTE — Evaluation (Signed)
Physical Therapy Evaluation Patient Details Name: Jeanette White MRN: [id number] DOB: [birth year] Today's Date: 05/08/2016   History of Present Illness  82 y.o. female with a history of Arthritis, bronchial month, coronary artery disease, diabetes mellitus, GERD, hypertension, peripheral vascular disease, neuropathy, spinal stenosis, spleen apnea presented to the emergency room with generalized weakness, has had 3 recent falls trying to transfer from w/c. Patient is usually wheelchair-bound, admitted with hypercalcemia    Clinical Impression  Pt shows good effort and willingness to participate with PT but is very weak and is not even able to sit up at EOB.  She reports that she does a little walking with [facility] and is eager to continue working with PT.  Pt is unable to transfer to w/c and is considerably weaker than baseline - it appears she is not safe to return home at this time.     Follow Up Recommendations SNF (pt wanting to go home if possible and continue to [facility])    Equipment Recommendations       Recommendations for Other Services       Precautions / Restrictions Precautions Precautions: Fall Restrictions Weight Bearing Restrictions: No      Mobility  Bed Mobility Overal bed mobility: Needs Assistance Bed Mobility: Supine to Sit;Sit to Supine     Supine to sit: Max assist Sit to supine: Max assist   General bed mobility comments: Pt showed good effort in trying to get to sitting but reports too much pain and fatigue and is unable to attain full sitting  Transfers                 General transfer comment: unable to attempt  Ambulation/Gait                Stairs            Wheelchair Mobility    Modified Rankin (Stroke Patients Only)       Balance Overall balance assessment: Needs assistance   Sitting balance-Leahy Scale: Poor                                       Pertinent Vitals/Pain Pain Assessment:  (chronic b/l  ankle, L shoulder and general arthritic pain)    Home Living Family/patient expects to be discharged to::  (wants to go home, family meeting in 2 days to determine )                      Prior Function Level of Independence: Needs assistance   Gait / Transfers Assistance Needed: able to do some minimal ambulation in // bars with PT at [facility], transfers independently to/from w/c  ADL's / Homemaking Assistance Needed: pt has aides QD, does do while a bit of her homemaking as she is able        Hand Dominance        Extremity/Trunk Assessment   Upper Extremity Assessment Upper Extremity Assessment: Generalized weakness (b/l shoulder very limited AROM, functional below shld level)    Lower Extremity Assessment Lower Extremity Assessment: Generalized weakness (AROM in all planes, grossly 4-/5 t/o )       Communication   Communication: No difficulties  Cognition Arousal/Alertness: Awake/alert Behavior During Therapy: WFL for tasks assessed/performed Overall Cognitive Status: Within Functional Limits for tasks assessed  General Comments      Exercises General Exercises - Lower Extremity Ankle Circles/Pumps: AROM;10 reps;Both Heel Slides: Strengthening;10 reps;Both Hip ABduction/ADduction: Strengthening;10 reps;Both Straight Leg Raises: AROM;10 reps;Both   Assessment/Plan    PT Assessment Patient needs continued PT services  PT Problem List Decreased strength;Decreased mobility;Decreased balance;Decreased activity tolerance;Decreased knowledge of use of DME;Decreased safety awareness;Pain          PT Treatment Interventions DME instruction;Functional mobility training;Therapeutic activities;Therapeutic exercise;Balance training;Neuromuscular re-education;Patient/family education    PT Goals (Current goals can be found in the Care Plan section)  Acute Rehab PT Goals Patient Stated Goal: get stonger and able to do a little walking PT  Goal Formulation: With patient Time For Goal Achievement: 05/22/16 Potential to Achieve Goals: Fair    Frequency Min 2X/week   Barriers to discharge        Co-evaluation               End of Session   Activity Tolerance: Patient limited by fatigue Patient left: with bed alarm set;with call bell/phone within reach;with family/visitor present           Time: [id number] PT Time Calculation (min) (ACUTE ONLY): 40 min   Charges:   PT Evaluation $PT Eval Low Complexity: 1 Procedure PT Treatments $Therapeutic Exercise: 8-22 mins   PT G Codes:        Kreg Shropshire, DPT 05/08/2016, 5:07 PM

## 2016-05-09 LAB — BASIC METABOLIC PANEL
ANION GAP: 6 (ref 5–15)
BUN: 17 mg/dL (ref 6–20)
CHLORIDE: 102 mmol/L (ref 101–111)
CO2: 31 mmol/L (ref 22–32)
Calcium: 9 mg/dL (ref 8.9–10.3)
Creatinine, Ser: 0.59 mg/dL (ref 0.44–1.00)
GFR calc Af Amer: >60 mL/min (ref >60)
GLUCOSE: 96 mg/dL (ref 65–99)
POTASSIUM: 3.3 mmol/L — AB (ref 3.5–5.1)
Sodium: 139 mmol/L (ref 135–145)

## 2016-05-09 MED ORDER — OXYCODONE-ACETAMINOPHEN 5-325 MG PO TABS
2.0000 | ORAL_TABLET | Freq: Four times a day (QID) | ORAL | Status: DC | PRN
  Administered 2016-05-09 – 2016-05-10 (×2): 2 via ORAL
  Filled 2016-05-09: qty 2

## 2016-05-09 MED ORDER — POTASSIUM CHLORIDE CRYS ER 20 MEQ PO TBCR
40.0000 meq | EXTENDED_RELEASE_TABLET | Freq: Once | ORAL | Status: AC
  Administered 2016-05-09: 40 meq via ORAL
  Filled 2016-05-09: qty 2

## 2016-05-09 NOTE — Progress Notes (Signed)
Patient ID: Jeanette White, female   DOB: 06-10-1927, 82 y.o.   MRN: [id number]  [facility] PROGRESS NOTE  Kassidee Hower [id number] DOB: 1927-08-04 DOA: 05/07/2016 PCP: No primary care provider on file.  HPI/Subjective: Patient feels okay. Still feeling weak. Had a bowel movement this morning and no further discomfort in the abdomen.  Objective: Vitals:   05/08/16 2205 05/09/16 0403  BP:  (!) 117/41  Pulse: 75 70  Resp:  19  Temp:  98.7 F (37.1 C)    Filed Weights   05/07/16 0054 05/07/16 0637  Weight: 97.1 kg (214 lb) 97.5 kg (215 lb)    ROS: Review of Systems  Constitutional: Negative for chills and fever.  Eyes: Negative for blurred vision.  Respiratory: Negative for cough and shortness of breath.   Cardiovascular: Negative for chest pain.  Gastrointestinal: Negative for abdominal pain, constipation, diarrhea, nausea and vomiting.  Genitourinary: Negative for dysuria.  Musculoskeletal: Negative for joint pain.  Neurological: Positive for weakness. Negative for dizziness and headaches.   Exam: Physical Exam  Constitutional: She is oriented to person, place, and time.  HENT:  Nose: No mucosal edema.  Mouth/Throat: No oropharyngeal exudate or posterior oropharyngeal edema.  Eyes: Conjunctivae, EOM and lids are normal. Pupils are equal, round, and reactive to light.  Neck: No JVD present. Carotid bruit is not present. No edema present. No thyroid mass and no thyromegaly present.  Cardiovascular: S1 normal and S2 normal.  Exam reveals no gallop.   No murmur heard. Pulses:      Dorsalis pedis pulses are 2+ on the right side, and 2+ on the left side.  Respiratory: No respiratory distress. She has decreased breath sounds in the right lower field and the left lower field. She has no wheezes. She has no rhonchi. She has no rales.  GI: Soft. Bowel sounds are normal. There is no tenderness.  Musculoskeletal:       Right ankle: She exhibits swelling.       Left  ankle: She exhibits swelling.  Lymphadenopathy:    She has no cervical adenopathy.  Neurological: She is alert and oriented to person, place, and time. No cranial nerve deficit.  Skin: Skin is warm. No rash noted. Nails show no clubbing.  Psychiatric: She has a normal mood and affect.      Data Reviewed: Basic Metabolic Panel:  Recent Labs Lab 05/07/16 0059 05/08/16 0755 05/09/16 0413  NA 139 139 139  K 4.1 3.5 3.3*  CL 102 103 102  CO2 30 31 31   GLUCOSE 117* 137* 96  BUN 16 16 17   CREATININE 0.64 0.63 0.59  CALCIUM 11.0* 9.7 9.0   Liver Function Tests:  Recent Labs Lab 05/07/16 0059  AST 21  ALT 10*  ALKPHOS 110  BILITOT 0.4  PROT 6.4*  ALBUMIN 3.2*   CBC:  Recent Labs Lab 05/07/16 0059  WBC 6.2  HGB 11.4*  HCT 34.4*  MCV 91.0  PLT 232     Scheduled Meds: . FLUoxetine  40 mg Oral Daily  . gabapentin  900 mg Oral QHS  . mouth rinse  15 mL Mouth Rinse BID  . nystatin   Topical TID  . pantoprazole  40 mg Oral QAC breakfast  . polyethylene glycol  17 g Oral Daily  . pravastatin  80 mg Oral QHS  . Vitamin D (Ergocalciferol)  50,000 Units Oral Q30 days    Assessment/Plan:  1. Hypercalcemia of malignancy. Improved with Zometa.  2. Right breast  mass and lymphadenopathy, right hilar mass with pleural, pulmonary and peritoneal metastases. Oncology ordered an ultrasound-guided biopsy of the right breast. CA 27.29 is elevated at 465. May also need a bronchoscopy for the right hilar mass. Holding Plavix. Holding Lovenox. Still awaiting pulmonary consultation note 3. Fungal infection under skin folds nystatin topical 3 times a day 4. GERD on Protonix 5. Hyperlipidemia unspecified on pravastatin 6. Depression on fluoxetine 7. Morbid obesity  Code Status:     Code Status Orders        Start     Ordered   05/07/16 0655  Do not attempt resuscitation (DNR)  Continuous    Question Answer Comment  In the event of cardiac or respiratory ARREST Do not call  a "code blue"   In the event of cardiac or respiratory ARREST Do not perform Intubation, CPR, defibrillation or ACLS   In the event of cardiac or respiratory ARREST Use medication by any route, position, wound care, and other measures to relive pain and suffering. May use oxygen, suction and manual treatment of airway obstruction as needed for comfort.      05/07/16 0654    Code Status History    Date Active Date Inactive Code Status Order ID Comments User Context   This patient has a current code status but no historical code status.    Advance Directive Documentation   Flowsheet Row Most Recent Value  Type of Advance Directive  Out of facility DNR (pink MOST or yellow form)  Pre-existing out of facility DNR order (yellow form or pink MOST form)  Yellow form placed in chart (order not valid for inpatient use)  "MOST" Form in Place?  No data     Family Communication: Spoke with son on phone Yesterday Disposition Plan: Potentially home with the pace program in next day or so.  Consultants:  Oncology  Pulmonary  Time spent: 24 minutes   [place], [place]

## 2016-05-09 NOTE — Progress Notes (Signed)
[place]  [phone] [phone]) [phone]  ID: Jeanette White OB: [id number]  MR#: [id number]  [id number]  No care team member to display  CHIEF COMPLAINT: Likely stage IV breast cancer, hypercholesterolemia, weakness.  INTERVAL HISTORY: Patient improved today. She is anxious about her likely diagnosis, but otherwise feels well.  REVIEW OF SYSTEMS:   Review of Systems  Constitutional: Positive for malaise/fatigue. Negative for fever and weight loss.  Respiratory: Positive for shortness of breath.   Cardiovascular: Negative.  Negative for chest pain and leg swelling.  Gastrointestinal: Negative.  Negative for abdominal pain.  Musculoskeletal: Negative.   Neurological: Positive for weakness.  Psychiatric/Behavioral: The patient is nervous/anxious.     As per HPI. Otherwise, a complete review of systems is negative.  PAST MEDICAL HISTORY: Past Medical History:  Diagnosis Date  . Anginal pain (HCC)    STABLE  . Arthritis   . Asthma    CHRONIC OBSTRUCTIVE  . B12 deficiency   . Complication of anesthesia    SLOW TO WAKE UP  . Coronary artery disease   . Depression    MAJOR  . Diabetes mellitus without complication ([place])   . Edema    HANDS/FEET  . GERD (gastroesophageal reflux disease)   . HOH (hard of hearing)   . Hypertension   . Myocardial infarction   . Peripheral vascular disease ([place])   . Polyneuropathy ([place])   . Renal anomaly    ARTERY STENOSIS  . Rhinitis, allergic    CHRONIC  . Shoulder disorder    ROTATOR CUFF TEAR  LEFT  . Sleep apnea   . Spinal disorder    STENOSIS    PAST SURGICAL HISTORY: Past Surgical History:  Procedure Laterality Date  . APPENDECTOMY    . CATARACT EXTRACTION W/PHACO Left 12/09/2015   Procedure: CATARACT EXTRACTION PHACO AND INTRAOCULAR LENS PLACEMENT (IOC);  Surgeon: Estill Cotta, MD;  Location: [facility] ORS;  Service: Ophthalmology;  Laterality: Left;  [place] 01:21 AP% 22.8 CDE  31.49 fluid pack lot # [id number]  . CHOLECYSTECTOMY    . CORONARY ANGIOPLASTY     STENT  . CORONARY ARTERY BYPASS GRAFT    . TOTAL KNEE ARTHROPLASTY Bilateral     FAMILY HISTORY: Family History  Problem Relation Age of Onset  . Alzheimer's disease Sister   . Arthritis Sister     ADVANCED DIRECTIVES (Y/N):  @ADVDIR @  HEALTH MAINTENANCE: Social History  Substance Use Topics  . Smoking status: Never Smoker  . Smokeless tobacco: Never Used  . Alcohol use No     Colonoscopy:  PAP:  Bone density:  Lipid panel:  Allergies  Allergen Reactions  . Ace Inhibitors   . Sulfa Antibiotics     Current Facility-Administered Medications  Medication Dose Route Frequency Provider Last Rate Last Dose  . acetaminophen (TYLENOL) tablet 650 mg  650 mg Oral Q6H PRN Saundra Shelling, MD   650 mg at 05/08/16 2153   Or  . acetaminophen (TYLENOL) suppository 650 mg  650 mg Rectal Q6H PRN Saundra Shelling, MD      . FLUoxetine (PROZAC) capsule 40 mg  40 mg Oral Daily Saundra Shelling, MD   40 mg at 05/09/16 0841  . gabapentin (NEURONTIN) capsule 900 mg  900 mg Oral QHS Saundra Shelling, MD   900 mg at 05/08/16 2153  . iopamidol (ISOVUE-M) 41 % intrathecal injection 10 mL  10 mL Intrathecal Once PRN Earleen Newport, MD      . MEDLINE mouth rinse  15 mL Mouth Rinse BID Loletha Grayer, MD   15 mL at 05/09/16 0841  . nitroGLYCERIN (NITROSTAT) SL tablet 0.4 mg  0.4 mg Sublingual Q5 min PRN Saundra Shelling, MD      . nystatin (MYCOSTATIN/NYSTOP) topical powder   Topical TID Loletha Grayer, MD      . ondansetron [facility]) tablet 4 mg  4 mg Oral Q6H PRN Saundra Shelling, MD       Or  . ondansetron (ZOFRAN) injection 4 mg  4 mg Intravenous Q6H PRN Saundra Shelling, MD      . oxyCODONE-acetaminophen (PERCOCET/ROXICET) 5-325 MG per tablet 2 tablet  2 tablet Oral Q6H PRN Saundra Shelling, MD   2 tablet at 05/09/16 0336  . pantoprazole (PROTONIX) EC tablet 40 mg  40 mg Oral QAC breakfast Saundra Shelling, MD   40 mg at 05/09/16  0841  . polyethylene glycol (MIRALAX / GLYCOLAX) packet 17 g  17 g Oral Daily Loletha Grayer, MD   17 g at 05/09/16 0840  . pravastatin (PRAVACHOL) tablet 80 mg  80 mg Oral QHS Saundra Shelling, MD   80 mg at 05/08/16 2153  . senna-docusate (Senokot-S) tablet 1 tablet  1 tablet Oral QHS PRN Saundra Shelling, MD   1 tablet at 05/08/16 2153  . Vitamin D (Ergocalciferol) (DRISDOL) capsule 50,000 Units  50,000 Units Oral Q30 days Saundra Shelling, MD   50,000 Units at 05/09/16 0841    OBJECTIVE: Vitals:   05/09/16 0403 05/09/16 1356  BP: (!) 117/41 (!) 106/45  Pulse: 70 66  Resp: 19   Temp: 98.7 F (37.1 C) 98.3 F (36.8 C)     Body mass index is 36.9 kg/m.    ECOG FS:2 - Symptomatic, <50% confined to bed  General: Well-developed, well-nourished, no acute distress. Eyes: Pink conjunctiva, anicteric sclera. Breasts: Palpable right lower outer quadrant breast mass.  Lungs: Clear to auscultation bilaterally. Heart: Regular rate and rhythm. No rubs, murmurs, or gallops. Abdomen: Soft, nontender, nondistended. No organomegaly noted, normoactive bowel sounds. Musculoskeletal: No edema, cyanosis, or clubbing. Neuro: Alert, answering all questions appropriately. Cranial nerves grossly intact. Skin: No rashes or petechiae noted. Psych: Normal affect.   LAB RESULTS:  Lab Results  Component Value Date   NA 139 05/09/2016   K 3.3 (L) 05/09/2016   CL 102 05/09/2016   CO2 31 05/09/2016   GLUCOSE 96 05/09/2016   BUN 17 05/09/2016   CREATININE 0.59 05/09/2016   CALCIUM 9.0 05/09/2016   PROT 6.4 (L) 05/07/2016   ALBUMIN 3.2 (L) 05/07/2016   AST 21 05/07/2016   ALT 10 (L) 05/07/2016   ALKPHOS 110 05/07/2016   BILITOT 0.4 05/07/2016   GFRNONAA >60 05/09/2016   GFRAA >60 05/09/2016    Lab Results  Component Value Date   WBC 6.2 05/07/2016   NEUTROABS 5.9 06/10/2013   HGB 11.4 (L) 05/07/2016   HCT 34.4 (L) 05/07/2016   MCV 91.0 05/07/2016   PLT 232 05/07/2016   Lab Results  Component  Value Date   LABCA2 465.0 (H) 05/07/2016     STUDIES: Dg Chest 1 View  Result Date: 05/07/2016 CLINICAL DATA:  Chest pain and weakness. EXAM: CHEST 1 VIEW COMPARISON:  06/10/2013, 04/18/2013 FINDINGS: Innumerable pulmonary nodules, significantly increased from 06/10/2013 and 04/18/2013. This may represent diffuse metastatic disease. There also is a small right pleural effusion which is new. Moderate cardiomegaly, probably unchanged. Mild right base lung opacity adjacent to the pleural effusion, likely atelectatic. IMPRESSION: 1. Numerous pulmonary nodules, suspicious for hematogenous  metastases. This is progressed from 06/10/2013. 2. Unchanged cardiomegaly. 3. Right pleural effusion and adjacent atelectatic appearing lung base opacities. Electronically Signed   By: Andreas Newport M.D.   On: 05/07/2016 02:08   Dg Pelvis 1-2 Views  Result Date: 05/07/2016 CLINICAL DATA:  82 year old female with weakness and pelvic pain. No injury. EXAM: PELVIS - 1-2 VIEW COMPARISON:  CT of the abdomen pelvis dated 04/18/2013 FINDINGS: There is no acute fracture or dislocation. There is advanced osteopenia with osteoarthritic changes of the hips. Lucencies over the femoral heads and neck likely related to osteopenia. Metastatic disease is much less likely. Partially visualized intramedullary cement in the mid right femoral diaphysis. Hernia repair mesh material noted. The soft tissues are grossly unremarkable. IMPRESSION: No acute fracture or dislocation. Advanced osteopenia with osteoarthritic changes of the hips. Electronically Signed   By: Anner Crete M.D.   On: 05/07/2016 02:11   Ct Chest W Contrast  Result Date: 05/07/2016 CLINICAL DATA:  Hypercalcemia.  Concern for metastatic disease. EXAM: CT CHEST, ABDOMEN, AND PELVIS WITH CONTRAST TECHNIQUE: Multidetector CT imaging of the chest, abdomen and pelvis was performed following the standard protocol during bolus administration of intravenous contrast.  CONTRAST:  186mL ISOVUE-300 IOPAMIDOL (ISOVUE-300) INJECTION 61% COMPARISON:  None. FINDINGS: CT CHEST FINDINGS Cardiovascular: There is coronary artery and aortic atherosclerosis. Heart size is within normal limits. Mediastinum/Nodes: There is no definite mediastinal adenopathy. There are multiple masses along margin of the mediastinum, but I suspect that these are actually pleural based. Lungs/Pleura: There are numerous pleural based masses, the largest of which is at the anterior right hilum, measuring 4.7 x 3.7 cm, which encases and occludes the right middle lobe bronchus and abuts the right pulmonary artery and anterior right pulmonary vein. There is an additional large mass of the right lung apex, measuring 3.6 x 4.2 cm. There is a medium-sized right pleural effusion and small left pleural effusion. There are numerous scattered pulmonary nodules. The largest nodules are in the right upper lobe, measuring up to 2.2 cm. Nodules within the left lung are smaller, measuring up to 1.3 cm. The degree of pleural disease on the left is less than that on the right. In multiple locations, the pleural disease extends into the peri pleural fat. However, I do not see definite chest wall invasion. There is no extension of the disease and the spinal canal. Musculoskeletal: The mass in the lower outer quadrant of the right breast measures 3.2 x 2.8 cm. CT ABDOMEN PELVIS FINDINGS Hepatobiliary: No focal liver lesions. No biliary dilatation. The gallbladder surgically absent. Pancreas: The head and body of pancreas are atrophic. The tail is unremarkable. No pancreatic ductal dilatation or peripancreatic fluid collection. Spleen: Normal Adrenals/Urinary Tract: The adrenal glands are normal. No hydronephrosis or focal renal mass. Stomach/Bowel: Surgical staples noted within the anterior abdomen. The appendix is not visualized. No dilated small bowel or other evidence of obstruction. No enteric or colonic inflammation.  Vascular/Lymphatic: There are multiple abnormal appearing aortocaval lymph nodes measuring up to 1.1 cm. Reproductive: Status post hysterectomy.  No adnexal mass. Other: There are multiple retroperitoneal nodules measuring up to 1.4 x 0.6 cm. Musculoskeletal: Hyperenhancing nodule noted within the left thigh musculature just adjacent to the left iliac wing (series 2, image 92). Bones are osteopenic. There is multilevel lumbar facet arthrosis. Mixed lytic and sclerotic lesions are seen within the T12 vertebral body. There is a lytic lesion within the T11 spinous process. There are other areas of heterogeneous density within the thoracic  vertebral column. There are multiple areas of abnormal sclerosis within the ribs. IMPRESSION: 1. Severe bilateral pulmonary and pleural metastatic disease, with dominant right hilar mass which encases and occludes the right middle lobe bronchus and abuts the right pulmonary artery. 2. Multiple peritoneal nodules and enlarged, abnormal appearing aortocaval lymph nodes also consistent with metastatic disease. 3. Multiple abnormal aortocaval lymph nodes measuring up to 1.1 cm, suggesting lymphatic spread of disease. 4. Soft tissue masses of the right axilla and right breast. This could indicate a primary breast carcinoma versus soft tissue metastatic disease. Additional extraperitoneal soft tissue metastasis adjacent to the left iliac wing. 5. Multifocal osseous metastatic disease. 6. Aortic and coronary artery atherosclerosis. Electronically Signed   By: Ulyses Jarred M.D.   On: 05/07/2016 04:14   Ct Abdomen Pelvis W Contrast  Result Date: 05/07/2016 CLINICAL DATA:  Hypercalcemia.  Concern for metastatic disease. EXAM: CT CHEST, ABDOMEN, AND PELVIS WITH CONTRAST TECHNIQUE: Multidetector CT imaging of the chest, abdomen and pelvis was performed following the standard protocol during bolus administration of intravenous contrast. CONTRAST:  175mL ISOVUE-300 IOPAMIDOL (ISOVUE-300)  INJECTION 61% COMPARISON:  None. FINDINGS: CT CHEST FINDINGS Cardiovascular: There is coronary artery and aortic atherosclerosis. Heart size is within normal limits. Mediastinum/Nodes: There is no definite mediastinal adenopathy. There are multiple masses along margin of the mediastinum, but I suspect that these are actually pleural based. Lungs/Pleura: There are numerous pleural based masses, the largest of which is at the anterior right hilum, measuring 4.7 x 3.7 cm, which encases and occludes the right middle lobe bronchus and abuts the right pulmonary artery and anterior right pulmonary vein. There is an additional large mass of the right lung apex, measuring 3.6 x 4.2 cm. There is a medium-sized right pleural effusion and small left pleural effusion. There are numerous scattered pulmonary nodules. The largest nodules are in the right upper lobe, measuring up to 2.2 cm. Nodules within the left lung are smaller, measuring up to 1.3 cm. The degree of pleural disease on the left is less than that on the right. In multiple locations, the pleural disease extends into the peri pleural fat. However, I do not see definite chest wall invasion. There is no extension of the disease and the spinal canal. Musculoskeletal: The mass in the lower outer quadrant of the right breast measures 3.2 x 2.8 cm. CT ABDOMEN PELVIS FINDINGS Hepatobiliary: No focal liver lesions. No biliary dilatation. The gallbladder surgically absent. Pancreas: The head and body of pancreas are atrophic. The tail is unremarkable. No pancreatic ductal dilatation or peripancreatic fluid collection. Spleen: Normal Adrenals/Urinary Tract: The adrenal glands are normal. No hydronephrosis or focal renal mass. Stomach/Bowel: Surgical staples noted within the anterior abdomen. The appendix is not visualized. No dilated small bowel or other evidence of obstruction. No enteric or colonic inflammation. Vascular/Lymphatic: There are multiple abnormal appearing  aortocaval lymph nodes measuring up to 1.1 cm. Reproductive: Status post hysterectomy.  No adnexal mass. Other: There are multiple retroperitoneal nodules measuring up to 1.4 x 0.6 cm. Musculoskeletal: Hyperenhancing nodule noted within the left thigh musculature just adjacent to the left iliac wing (series 2, image 92). Bones are osteopenic. There is multilevel lumbar facet arthrosis. Mixed lytic and sclerotic lesions are seen within the T12 vertebral body. There is a lytic lesion within the T11 spinous process. There are other areas of heterogeneous density within the thoracic vertebral column. There are multiple areas of abnormal sclerosis within the ribs. IMPRESSION: 1. Severe bilateral pulmonary and pleural metastatic disease,  with dominant right hilar mass which encases and occludes the right middle lobe bronchus and abuts the right pulmonary artery. 2. Multiple peritoneal nodules and enlarged, abnormal appearing aortocaval lymph nodes also consistent with metastatic disease. 3. Multiple abnormal aortocaval lymph nodes measuring up to 1.1 cm, suggesting lymphatic spread of disease. 4. Soft tissue masses of the right axilla and right breast. This could indicate a primary breast carcinoma versus soft tissue metastatic disease. Additional extraperitoneal soft tissue metastasis adjacent to the left iliac wing. 5. Multifocal osseous metastatic disease. 6. Aortic and coronary artery atherosclerosis. Electronically Signed   By: Ulyses Jarred M.D.   On: 05/07/2016 04:14    ASSESSMENT: Likely stage IV breast cancer, hypercholesterolemia, weakness.  PLAN:    1. Likely stage IV breast cancer: CT scan results reviewed independently and reported as above. Although patient has a dominant lung mass, given her right breast mass with right axillary lymphadenopathy this is highly suspicious for underlying metastatic breast cancer. Her CA-27-29 is also significantly elevated. It is possible patient has a second primary,  but she is a nonsmoker making this less likely. Ultimately, she may require at least one biopsy of her breast mass and possibly a second of her hilar mass to confirm diagnosis. Ultrasound-guided biopsy of breast mass has been ordered. Recommend consult pulmonology for consideration of bronchoscopy.  2. Hypercalcemia: Likely secondary to underlying malignancy. Patient's calcium level is now within normal limits. She received IV Zometa on May 07, 2016.  3. Weakness: Multifactorial, monitor.  Appreciate consult, will follow.  Lloyd Huger, MD   05/09/2016 2:33 PM

## 2016-05-10 ENCOUNTER — Ambulatory Visit
Admit: 2016-05-10 | Discharge: 2016-05-10 | Disposition: A | Discharge status: Home / Self Care | Payer: Medicare (Managed Care) | Attending: Oncology | Admitting: Oncology

## 2016-05-10 ENCOUNTER — Telehealth: Payer: Self-pay | Admitting: Diagnostic Radiology

## 2016-05-10 MED ORDER — IPRATROPIUM-ALBUTEROL 0.5-2.5 (3) MG/3ML IN SOLN
3.0000 mL | Freq: Four times a day (QID) | RESPIRATORY_TRACT | Status: DC
  Administered 2016-05-10 – 2016-05-11 (×5): 3 mL via RESPIRATORY_TRACT
  Filled 2016-05-10 (×5): qty 3

## 2016-05-10 MED ORDER — OXYCODONE-ACETAMINOPHEN 5-325 MG PO TABS
1.0000 | ORAL_TABLET | Freq: Four times a day (QID) | ORAL | Status: DC | PRN
  Filled 2016-05-10 (×2): qty 1

## 2016-05-10 MED ORDER — POTASSIUM CHLORIDE CRYS ER 20 MEQ PO TBCR
40.0000 meq | EXTENDED_RELEASE_TABLET | Freq: Once | ORAL | Status: AC
  Administered 2016-05-10: 09:00:00 40 meq via ORAL
  Filled 2016-05-10: qty 2

## 2016-05-10 MED ORDER — FUROSEMIDE 10 MG/ML IJ SOLN
20.0000 mg | Freq: Once | INTRAMUSCULAR | Status: AC
  Administered 2016-05-10: 09:00:00 20 mg via INTRAVENOUS
  Filled 2016-05-10: qty 2

## 2016-05-10 NOTE — NC FL2 (Signed)
[place] LEVEL OF CARE SCREENING TOOL     IDENTIFICATION  Patient Name: Jeanette White Birthdate: [birth year] Sex: female Admission Date (Current Location): 05/07/2016  [place] and [place] Number:  Engineering geologist and Address:  [facility], [address] [place], [place] [address]      Provider Number: [id number]  Attending Physician Name and Address:  Loletha Grayer, MD  Relative Name and Phone Number:  Harvin Hazel   [phone] or [place], [place]  [phone] or Pace,Pace [name] [phone]    Current Level of Care: Hospital Recommended Level of Care: [place] Prior Approval Number:    Date Approved/Denied:   PASRR Number: [id number]  Discharge Plan: SNF    Current Diagnoses: Patient Active Problem List   Diagnosis Date Noted  . Hypercalcemia 05/07/2016  . Pulmonary nodules/lesions, multiple 05/07/2016    Orientation RESPIRATION BLADDER Height & Weight     Self, Time, Situation, Place  Normal Incontinent Weight: 215 lb (97.5 kg) Height:  5\' 4"  (162.6 cm)  BEHAVIORAL SYMPTOMS/MOOD NEUROLOGICAL BOWEL NUTRITION STATUS      Incontinent Diet (Cardiac diet)  AMBULATORY STATUS COMMUNICATION OF NEEDS Skin   Limited Assist Verbally Surgical wounds                       Personal Care Assistance Level of Assistance  Bathing, Feeding, Dressing Bathing Assistance: Limited assistance Feeding assistance: Independent Dressing Assistance: Limited assistance     Functional Limitations Info  Sight, Hearing, Speech Sight Info: Adequate Hearing Info: Adequate Speech Info: Adequate    SPECIAL CARE FACTORS FREQUENCY  PT (By licensed PT)     PT Frequency: 5x a week              Contractures      Additional Factors Info  Psychotropic, Code Status, Allergies Code Status Info: DNR Allergies Info: ACE INHIBITORS, SULFA ANTIBIOTICS Psychotropic Info: FLUoxetine (PROZAC) capsule  40 mg         Current Medications (05/10/2016):  This is the current hospital active medication list Current Facility-Administered Medications  Medication Dose Route Frequency Provider Last Rate Last Dose  . acetaminophen (TYLENOL) tablet 650 mg  650 mg Oral Q6H PRN Saundra Shelling, MD   650 mg at 05/09/16 1951   Or  . acetaminophen (TYLENOL) suppository 650 mg  650 mg Rectal Q6H PRN Saundra Shelling, MD      . FLUoxetine (PROZAC) capsule 40 mg  40 mg Oral Daily Saundra Shelling, MD   40 mg at 05/10/16 0847  . gabapentin (NEURONTIN) capsule 900 mg  900 mg Oral QHS Saundra Shelling, MD   900 mg at 05/09/16 1951  . iopamidol (ISOVUE-M) 41 % intrathecal injection 10 mL  10 mL Intrathecal Once PRN Earleen Newport, MD      . ipratropium-albuterol (DUONEB) 0.5-2.5 (3) MG/3ML nebulizer solution 3 mL  3 mL Nebulization Q6H Loletha Grayer, MD   3 mL at 05/10/16 1318  . MEDLINE mouth rinse  15 mL Mouth Rinse BID Loletha Grayer, MD   15 mL at 05/10/16 0848  . nitroGLYCERIN (NITROSTAT) SL tablet 0.4 mg  0.4 mg Sublingual Q5 min PRN Pavan Pyreddy, MD      . nystatin (MYCOSTATIN/NYSTOP) topical powder   Topical TID Loletha Grayer, MD      . ondansetron [facility]) tablet 4 mg  4 mg Oral Q6H PRN Saundra Shelling, MD       Or  . ondansetron (ZOFRAN) injection  4 mg  4 mg Intravenous Q6H PRN Saundra Shelling, MD      . oxyCODONE-acetaminophen (PERCOCET/ROXICET) 5-325 MG per tablet 1 tablet  1 tablet Oral Q6H PRN Loletha Grayer, MD      . oxyCODONE-acetaminophen (PERCOCET/ROXICET) 5-325 MG per tablet 2 tablet  2 tablet Oral Q6H PRN Saundra Shelling, MD   2 tablet at 05/09/16 0336  . pantoprazole (PROTONIX) EC tablet 40 mg  40 mg Oral QAC breakfast Saundra Shelling, MD   40 mg at 05/10/16 0847  . polyethylene glycol (MIRALAX / GLYCOLAX) packet 17 g  17 g Oral Daily Loletha Grayer, MD   17 g at 05/10/16 0847  . pravastatin (PRAVACHOL) tablet 80 mg  80 mg Oral QHS Saundra Shelling, MD   80 mg at 05/09/16 1951  . senna-docusate  (Senokot-S) tablet 1 tablet  1 tablet Oral QHS PRN Saundra Shelling, MD   1 tablet at 05/09/16 1951  . Vitamin D (Ergocalciferol) (DRISDOL) capsule 50,000 Units  50,000 Units Oral Q30 days Saundra Shelling, MD   50,000 Units at 05/09/16 [id number]     Discharge Medications: Please see discharge summary for a list of discharge medications.  Relevant Imaging Results:  Relevant Lab Results:   Additional Information SSN [id number]  Ross Ludwig, [place]

## 2016-05-10 NOTE — Clinical Social Work Placement (Signed)
   CLINICAL SOCIAL WORK PLACEMENT  NOTE  Date:  05/10/2016  Patient Details  Name: Jeanette White MRN: [id number] Date of Birth: [birth year]  Clinical Social Work is seeking post-discharge placement for this patient at the [place] level of care (*CSW will initial, date and re-position this form in  chart as items are completed):  Yes   Patient/family provided with [place]'s list of facilities offering this level of care within the geographic area requested by the patient (or if unable, by the patient's family).  Yes   Patient/family informed of their freedom to choose among providers that offer the needed level of care, that participate in Medicare, Medicaid or managed care program needed by the patient, have an available bed and are willing to accept the patient.  Yes   Patient/family informed of 's ownership interest in [facility] and [facility], as well as of the fact that they are under no obligation to receive care at these facilities.  PASRR submitted to EDS on 05/10/16     PASRR number received on       Existing PASRR number confirmed on 05/10/16     FL2 transmitted to all facilities in geographic area requested by pt/family on 05/10/16     FL2 transmitted to all facilities within larger geographic area on       Patient informed that his/her managed care company has contracts with or will negotiate with certain facilities, including the following:            Patient/family informed of bed offers received.  Patient chooses bed at       Physician recommends and patient chooses bed at      Patient to be transferred to   on  .  Patient to be transferred to facility by       Patient family notified on   of transfer.  Name of family member notified:        PHYSICIAN Please sign FL2, Please sign DNR     Additional Comment:    _______________________________________________ Ross Ludwig,  LCSWA 05/10/2016, 5:01 PM

## 2016-05-10 NOTE — Progress Notes (Signed)
Patient ID: Jeanette White, female   DOB: [birth year], 82 y.o.   MRN: [id number]  [facility] PROGRESS NOTE  Jeanette White 0000000 DOB: November 30, 1927 DOA: 05/07/2016 PCP: No primary care provider on file.  HPI/Subjective: Patient had some wheezing this morning mass for breathing treatment. I also gave a dose of Lasix. Patient had a breast biopsy today at 10:30 AM  Objective: Vitals:   05/10/16 0659 05/10/16 1201  BP:  (!) 122/47  Pulse: 68 72  Resp:    Temp:  97.8 F (36.6 C)    Filed Weights   05/07/16 0054 05/07/16 0637  Weight: 97.1 kg (214 lb) 97.5 kg (215 lb)    ROS: Review of Systems  Constitutional: Negative for chills and fever.  Eyes: Negative for blurred vision.  Respiratory: Negative for cough and shortness of breath.   Cardiovascular: Negative for chest pain.  Gastrointestinal: Negative for abdominal pain, constipation, diarrhea, nausea and vomiting.  Genitourinary: Negative for dysuria.  Musculoskeletal: Negative for joint pain.  Neurological: Positive for weakness. Negative for dizziness and headaches.   Exam: Physical Exam  Constitutional: She is oriented to person, place, and time.  HENT:  Nose: No mucosal edema.  Mouth/Throat: No oropharyngeal exudate or posterior oropharyngeal edema.  Eyes: Conjunctivae, EOM and lids are normal. Pupils are equal, round, and reactive to light.  Neck: No JVD present. Carotid bruit is not present. No edema present. No thyroid mass and no thyromegaly present.  Cardiovascular: S1 normal and S2 normal.  Exam reveals no gallop.   No murmur heard. Pulses:      Dorsalis pedis pulses are 2+ on the right side, and 2+ on the left side.  Respiratory: No respiratory distress. She has decreased breath sounds in the right lower field and the left lower field. She has no wheezes. She has no rhonchi. She has no rales.  GI: Soft. Bowel sounds are normal. There is no tenderness.  Musculoskeletal:       Right ankle: She  exhibits swelling.       Left ankle: She exhibits swelling.  Lymphadenopathy:    She has no cervical adenopathy.  Neurological: She is alert and oriented to person, place, and time. No cranial nerve deficit.  Skin: Skin is warm. No rash noted. Nails show no clubbing.  Psychiatric: She has a normal mood and affect.      Data Reviewed: Basic Metabolic Panel:  Recent Labs Lab 05/07/16 0059 05/08/16 0755 05/09/16 0413  NA 139 139 139  K 4.1 3.5 3.3*  CL 102 103 102  CO2 30 31 31   GLUCOSE 117* 137* 96  BUN 16 16 17   CREATININE 0.64 0.63 0.59  CALCIUM 11.0* 9.7 9.0   Liver Function Tests:  Recent Labs Lab 05/07/16 0059  AST 21  ALT 10*  ALKPHOS 110  BILITOT 0.4  PROT 6.4*  ALBUMIN 3.2*   CBC:  Recent Labs Lab 05/07/16 0059  WBC 6.2  HGB 11.4*  HCT 34.4*  MCV 91.0  PLT 232     Scheduled Meds: . FLUoxetine  40 mg Oral Daily  . gabapentin  900 mg Oral QHS  . ipratropium-albuterol  3 mL Nebulization Q6H  . mouth rinse  15 mL Mouth Rinse BID  . nystatin   Topical TID  . pantoprazole  40 mg Oral QAC breakfast  . polyethylene glycol  17 g Oral Daily  . pravastatin  80 mg Oral QHS  . Vitamin D (Ergocalciferol)  50,000 Units Oral Q30 days  Assessment/Plan:  1. Shortness of breath. It 1 dose of Lasix and breathing treatment and reevaluate tomorrow  2. Hypercalcemia of malignancy. Improved with Zometa.  3. Right breast mass and lymphadenopathy, right hilar mass with pleural, pulmonary and peritoneal metastases. Ultrasound-guided breast biopsy done today. CA 27.29 is elevated at 465. May also need a bronchoscopy for the right hilar mass. Holding Plavix. Holding Lovenox. Still awaiting pulmonary consultation note 4. Fungal infection under skin folds nystatin topical 3 times a day 5. GERD on Protonix 6. Hyperlipidemia unspecified on pravastatin 7. Depression on fluoxetine 8. Morbid obesity 9. 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. Patient is a history of  being on the pace program.  Code Status:     Code Status Orders        Start     Ordered   05/07/16 0655  Do not attempt resuscitation (DNR)  Continuous    Question Answer Comment  In the event of cardiac or respiratory ARREST Do not call a "code blue"   In the event of cardiac or respiratory ARREST Do not perform Intubation, CPR, defibrillation or ACLS   In the event of cardiac or respiratory ARREST Use medication by any route, position, wound care, and other measures to relive pain and suffering. May use oxygen, suction and manual treatment of airway obstruction as needed for comfort.      05/07/16 0654    Code Status History    Date Active Date Inactive Code Status Order ID Comments User Context   This patient has a current code status but no historical code status.    Advance Directive Documentation   Flowsheet Row Most Recent Value  Type of Advance Directive  Out of facility DNR (pink MOST or yellow form)  Pre-existing out of facility DNR order (yellow form or pink MOST form)  Yellow form placed in chart (order not valid for inpatient use)  "MOST" Form in Place?  No data     Family Communication: Spoke with son on phone today Disposition Plan: Potentially rehab tomorrow  Consultants:  Oncology  Pulmonary  Time spent: 24 minutes   Loletha Grayer  [employer]

## 2016-05-10 NOTE — Care Management Important Message (Signed)
Important Message  Patient Details  Name: Jeanette White MRN: [id number] Date of Birth: 28-Jun-1927   Medicare Important Message Given:  Yes    Shelbie Ammons, RN 05/10/2016, 9:12 AM

## 2016-05-10 NOTE — Clinical Social Work Note (Signed)
Clinical Social Work Assessment  Patient Details  Name: Jeanette White MRN: [id number] Date of Birth: [birth year]  Date of referral:  05/10/16               Reason for consult:  Facility Placement                Permission sought to share information with:  Facility Sport and exercise psychologist, Family Supports Permission granted to share information::  Yes, Verbal Permission Granted  Name::     Jeanette White, Jeanette White  [phone] or Jeanette White,Jeanette White   [phone] or Jeanette White, [phone]  Agency::  SNF admissions  Relationship::     Contact Information:     Housing/Transportation Living arrangements for the past 2 months:  [place] Level of Information:  Patient Patient Interpreter Needed:  None Criminal Activity/Legal Involvement Pertinent to Current Situation/Hospitalization:  No - Comment as needed Significant Relationships:  Adult Children, Other(Comment) ([place]) Lives with:  Self Do you feel safe going back to the place where you live?  No Need for family participation in patient care:  No (Coment)  Care giving concerns:  Patient feel she needs some short term rehab before she is able to return back home.   Social Worker assessment / plan:  Patient is a 82 year old female who is alert and oriented x4.  Patient lives alone, but has caregivers in her home and also goes to [place] during to the day.  Patient has been part of [facility] for over 6 years and she expresses she enjoys it still.  Patient does not have any family who live locally, her children live out of state.  Patient states she has been to rehab in the past, and is agreeable to going back to rehab again.  Patient was informed about how [facility] will help with her post discharge from the SNF.  Patient did not express any issues or concerns, and gave CSW permission to begin bed search in [facility].  Employment status:  Retired Forensic scientist:  Other (Comment Required) ([facility]) PT Recommendations:  [place] / Referral to community resources:  [place]  Patient/Family's Response to care: Patient is in agreement to going to SNF for rehab.  Patient/Family's Understanding of and Emotional Response to Diagnosis, Current Treatment, and Prognosis: Patient is aware that she needs some rehab first, she is hopeful she will not have to have it very long.  Emotional Assessment Appearance:  Appears stated age Attitude/Demeanor/Rapport:    Affect (typically observed):  Appropriate, Calm, Pleasant Orientation:  Oriented to Self, Oriented to Place, Oriented to  Time, Oriented to Situation Alcohol / Substance use:  Not Applicable Psych involvement (Current and /or in the community):  No (Comment)  Discharge Needs  Concerns to be addressed:  Lack of Support Readmission within the last 30 days:  No Current discharge risk:  None Barriers to Discharge:  No Barriers Identified   Jeanette White, LCSWA 05/10/2016, 4:56 PM

## 2016-05-11 ENCOUNTER — Telehealth: Payer: Self-pay | Admitting: *Deleted

## 2016-05-11 LAB — BASIC METABOLIC PANEL
Anion gap: 7 (ref 5–15)
BUN: 18 mg/dL (ref 6–20)
CO2: 31 mmol/L (ref 22–32)
CREATININE: 0.68 mg/dL (ref 0.44–1.00)
Calcium: 8 mg/dL — ABNORMAL LOW (ref 8.9–10.3)
Chloride: 103 mmol/L (ref 101–111)
Glucose, Bld: 104 mg/dL — ABNORMAL HIGH (ref 65–99)
Potassium: 3.9 mmol/L (ref 3.5–5.1)
Sodium: 141 mmol/L (ref 135–145)

## 2016-05-11 LAB — MAGNESIUM: Magnesium: 1.7 mg/dL (ref 1.7–2.4)

## 2016-05-11 MED ORDER — IPRATROPIUM-ALBUTEROL 0.5-2.5 (3) MG/3ML IN SOLN: 3.0000 mL | Freq: Four times a day (QID) | RESPIRATORY_TRACT | 0 refills | Status: AC

## 2016-05-11 MED ORDER — POTASSIUM CHLORIDE CRYS ER 10 MEQ PO TBCR: 10.0000 meq | EXTENDED_RELEASE_TABLET | Freq: Every day | ORAL | 0 refills | Status: DC

## 2016-05-11 MED ORDER — OXYCODONE-ACETAMINOPHEN 5-325 MG PO TABS: 1.0000 | ORAL_TABLET | Freq: Four times a day (QID) | ORAL | 0 refills | Status: DC | PRN

## 2016-05-11 MED ORDER — NYSTATIN 100000 UNIT/GM EX POWD: Freq: Three times a day (TID) | CUTANEOUS | 0 refills | Status: AC

## 2016-05-11 MED ORDER — MAGNESIUM SULFATE 2 GM/50ML IV SOLN
2.0000 g | Freq: Once | INTRAVENOUS | Status: AC
  Administered 2016-05-11: 2 g via INTRAVENOUS
  Filled 2016-05-11: qty 50

## 2016-05-11 MED ORDER — POLYETHYLENE GLYCOL 3350 17 G PO PACK: 17.0000 g | PACK | Freq: Every day | ORAL | 0 refills | Status: DC | PRN

## 2016-05-11 NOTE — Progress Notes (Signed)
Pt being discharged to [facility], report called to [facility], [facility] to transport pt, sons aware, pt with no complaints, no distress or discomfort noted

## 2016-05-11 NOTE — Discharge Summary (Signed)
[facility] NAME: Jeanette White    MR#:  [id number]  DATE OF BIRTH:  July 10, 1927  DATE OF ADMISSION:  05/07/2016 ADMITTING PHYSICIAN: Saundra Shelling, MD  DATE OF DISCHARGE: 05/01/2016  PRIMARY CARE PHYSICIAN: [facility]   ADMISSION DIAGNOSIS:  Hypercalcemia [E83.52] Weakness [R53.1] Metastatic malignant neoplasm, unspecified site ([place]) [C79.9]  DISCHARGE DIAGNOSIS:  Principal Problem:   Hypercalcemia Active Problems:   Pulmonary nodules/lesions, multiple   SECONDARY DIAGNOSIS:   Past Medical History:  Diagnosis Date  . Anginal pain (HCC)    STABLE  . Arthritis   . Asthma    CHRONIC OBSTRUCTIVE  . B12 deficiency   . Complication of anesthesia    SLOW TO WAKE UP  . Coronary artery disease   . Depression    MAJOR  . Diabetes mellitus without complication ([place])   . Edema    HANDS/FEET  . GERD (gastroesophageal reflux disease)   . HOH (hard of hearing)   . Hypertension   . Myocardial infarction   . Peripheral vascular disease ([place])   . Polyneuropathy ([place])   . Renal anomaly    ARTERY STENOSIS  . Rhinitis, allergic    CHRONIC  . Shoulder disorder    ROTATOR CUFF TEAR  LEFT  . Sleep apnea   . Spinal disorder    STENOSIS    HOSPITAL COURSE:   1. Hypercalcemia of malignancy. Improved with Sinemet. 2. Metastatic cancer. Right breast mass and lymphadenopathy, right hilar mass with pleural and pulmonary and peritoneal metastases. Ultrasound guided breast biopsy done yesterday. CA-27-29 is also elevated at 465. We did put in a pulmonary consultation during the hospital course but I do not see a note by pulmonary. Can consider bronchoscopy as outpatient if needed. Overall prognosis is poor. Patient is a DO NOT RESUSCITATE. Follow-up with Dr. Grayland Ormond as outpatient. 3. Fungal infection under skin folds. Nystatin topical 3 times a day 4. GERD on Protonix 5. Hyperlipidemia unspecified on pravastatin 6. Depression  on fluoxetine 7. Morbid obesity 8. Shortness of breath and cough. This has improved with breathing treatments and I will continue to give breathing treatments. Patient also did get Lasix yesterday. No signs of fever no need for antibiotics. 9. Weakness. Physical therapy recommended rehabilitation. Family would like the patient in rehabilitation. [facility]. 10. Patient is DO NOT RESUSCITATE. 11. Resume Plavix. If bronchoscopy needed as outpatient and will have to stop that prior to procedure.  DISCHARGE CONDITIONS:   Fair  CONSULTS OBTAINED:  Treatment Team:  Lloyd Huger, MD Allyne Gee, MD  DRUG ALLERGIES:   Allergies  Allergen Reactions  . Ace Inhibitors   . Sulfa Antibiotics     DISCHARGE MEDICATIONS:   Current Discharge Medication List    START taking these medications   Details  ipratropium-albuterol (DUONEB) 0.5-2.5 (3) MG/3ML SOLN Take 3 mLs by nebulization every 6 (six) hours. Qty: 360 mL, Refills: 0    nystatin (MYCOSTATIN/NYSTOP) powder Apply topically 3 (three) times daily. Qty: 15 g, Refills: 0    oxyCODONE-acetaminophen (PERCOCET/ROXICET) 5-325 MG tablet Take 1 tablet by mouth every 6 (six) hours as needed for moderate pain or severe pain. Qty: 30 tablet, Refills: 0    polyethylene glycol (MIRALAX / GLYCOLAX) packet Take 17 g by mouth daily as needed. Qty: 14 each, Refills: 0      CONTINUE these medications which have CHANGED   Details  potassium chloride (K-DUR,KLOR-CON)  10 MEQ tablet Take 1 tablet (10 mEq total) by mouth daily. 2 AM 1 HS Qty: 30 tablet, Refills: 0      CONTINUE these medications which have NOT CHANGED   Details  clopidogrel (PLAVIX) 75 MG tablet Take 75 mg by mouth daily.    FLUoxetine (PROZAC) 40 MG capsule Take 40 mg by mouth daily.    furosemide (LASIX) 20 MG tablet Take 10 mg by mouth daily.    pantoprazole (PROTONIX) 40 MG tablet Take 40 mg by mouth daily.      pravastatin (PRAVACHOL) 80 MG tablet Take 80 mg by mouth at bedtime.     pregabalin (LYRICA) 150 MG capsule Take 150 mg by mouth at bedtime.    ranitidine (ZANTAC) 150 MG tablet Take 150 mg by mouth daily.      STOP taking these medications     valsartan-hydrochlorothiazide (DIOVAN-HCT) 80-12.5 MG tablet      gabapentin (NEURONTIN) 300 MG capsule      nitroGLYCERIN (NITROSTAT) 0.4 MG SL tablet      Vitamin D, Ergocalciferol, (DRISDOL) 50000 units CAPS capsule          DISCHARGE INSTRUCTIONS:   Follow-up with Dr. Grayland Ormond one week Follow up with [facility]  If you experience worsening of your admission symptoms, develop shortness of breath, life threatening emergency, suicidal or homicidal thoughts you must seek medical attention immediately by calling 911 or calling your MD immediately  if symptoms less severe.  You Must read complete instructions/literature along with all the possible adverse reactions/side effects for all the Medicines you take and that have been prescribed to you. Take any new Medicines after you have completely understood and accept all the possible adverse reactions/side effects.   Please note  You were cared for by a hospitalist during your hospital stay. If you have any questions about your discharge medications or the care you received while you were in the hospital after you are discharged, you can call the unit and asked to speak with the hospitalist on call if the hospitalist that took care of you is not available. Once you are discharged, your primary care physician will handle any further medical issues. Please note that NO REFILLS for any discharge medications will be authorized once you are discharged, as it is imperative that you return to your primary care physician (or establish a relationship with a primary care physician if you do not have one) for your aftercare needs so that they can reassess your need for medications and  monitor your lab values.    Today   CHIEF COMPLAINT:   Chief Complaint  Patient presents with  . Weakness    HISTORY OF PRESENT ILLNESS:  Jeanette White  is a 82 y.o. female came in with weakness and falls and found to have hypercalcemia   VITAL SIGNS:  Blood pressure (!) 106/37, pulse 73, temperature 97.9 F (36.6 C), temperature source Oral, resp. rate 18, height 5' 4" (1.626 m), weight 97.5 kg (215 lb), SpO2 92 %.    PHYSICAL EXAMINATION:  GENERAL:  82 y.o.-year-old patient lying in the bed with no acute distress.  EYES: Pupils equal, round, reactive to light and accommodation. No scleral icterus. Extraocular muscles intact.  HEENT: Head atraumatic, normocephalic. Oropharynx and nasopharynx clear.  NECK:  Supple, no jugular venous distention. No thyroid enlargement, no tenderness.  LUNGS: Decreased breath sounds bilaterally, after coughing her lungs were clear. No wheezing, rales,rhonchi or crepitation. No use of accessory muscles  of respiration.  CARDIOVASCULAR: S1, S2 normal. No murmurs, rubs, or gallops.  ABDOMEN: Soft, non-tender, non-distended. Bowel sounds present. No organomegaly or mass.  EXTREMITIES: 2+ edema, no cyanosis, or clubbing.  NEUROLOGIC: Cranial nerves II through XII are intact. Muscle strength 5/5 in all extremities. Sensation intact. Gait not checked.  PSYCHIATRIC: The patient is alert and oriented x 3.  SKIN: No obvious rash, lesion, or ulcer.   DATA REVIEW:   CBC  Recent Labs Lab 05/07/16 0059  WBC 6.2  HGB 11.4*  HCT 34.4*  PLT 232    Chemistries   Recent Labs Lab 05/07/16 0059  05/11/16 0620  NA 139  < > 141  K 4.1  < > 3.9  CL 102  < > 103  CO2 30  < > 31  GLUCOSE 117*  < > 104*  BUN 16  < > 18  CREATININE 0.64  < > 0.68  CALCIUM 11.0*  < > 8.0*  MG  --   --  1.7  AST 21  --   --   ALT 10*  --   --   ALKPHOS 110  --   --   BILITOT 0.4  --   --   < > = values in this interval not displayed.    RADIOLOGY:  [place] Rt  Breast Bx W Loc Dev 1st Lesion Img Bx Spec US Guide  Result Date: 05/10/2016 CLINICAL DATA:  82 year old female currently an inpatient recently hospitalized with hypercalcemia and recent CT demonstrating pulmonary and pleural masses with a dominant right hilar mass, peritoneal nodules, aortocaval nodes and soft tissue right breast/right axillary mass. Request biopsy dominant mass in the right breast. Patient is currently hospitalized and bed bound with limited mobility. EXAM: ULTRASOUND GUIDED RIGHT BREAST CORE NEEDLE BIOPSY COMPARISON:  Previous exam(s). FINDINGS: I met with the patient and we discussed the procedure of ultrasound-guided biopsy, including benefits and alternatives. We discussed the high likelihood of a successful procedure. We discussed the risks of the procedure, including infection, bleeding, tissue injury, clip migration, and inadequate sampling. Informed written consent was given. The usual time-out protocol was performed immediately prior to the procedure. Diagnostic mammography was deferred due to patient's limited mobility. Sonographic evaluation of the right breast demonstrates a mixed echogenicity mass at 11 o'clock 10-12 cm from nipple measuring 3.4 by 2.6 x 3 cm. This corresponds with the large mass seen on recent CT. An additional adjacent irregular mass at 11 o'clock 15 cm from nipple measures 1.1 x 0.9 x 0.9 cm. It is unclear whether this mass represents a low axillary lymph node or a satellite nodule. Additional evaluation of the right axilla demonstrates lymph nodes of normal morphology. Using sterile technique and 1% Lidocaine as local anesthetic, under direct ultrasound visualization, a 14 gauge spring-loaded device was used to perform biopsy of the large mass in the right breast at 11 o'clock 10 cm from nipple using a lateral to medial approach. At the conclusion of the procedure a coil shaped HydroMARK tissue marker clip was deployed into the biopsy cavity. Post biopsy  mammograms to evaluate clip placement were not performed. IMPRESSION: 1. Ultrasound-guided biopsy of the 3.4 cm mass in the upper-outer right breast at the 11 o'clock position. 2. An additional adjacent mass versus lymph node seen in the upper-outer right breast measures up to 1.1 cm with further evaluation of the right axilla negative for lymphadenopathy. Electronically Signed   By: Everlean Alstrom M.D.   On: 05/10/2016 11:52  Management plans discussed with the patient, family and they are in agreement.  CODE STATUS:     Code Status Orders        Start     Ordered   05/07/16 [phone]  Do not attempt resuscitation (DNR)  Continuous    Question Answer Comment  In the event of cardiac or respiratory ARREST Do not call a "code blue"   In the event of cardiac or respiratory ARREST Do not perform Intubation, CPR, defibrillation or ACLS   In the event of cardiac or respiratory ARREST Use medication by any route, position, wound care, and other measures to relive pain and suffering. May use oxygen, suction and manual treatment of airway obstruction as needed for comfort.      05/07/16 0654    Code Status History    Date Active Date Inactive Code Status Order ID Comments User Context   This patient has a current code status but no historical code status.    Advance Directive Documentation   Flowsheet Row Most Recent Value  Type of Advance Directive  Out of facility DNR (pink MOST or yellow form)  Pre-existing out of facility DNR order (yellow form or pink MOST form)  Yellow form placed in chart (order not valid for inpatient use)  "MOST" Form in Place?  No data      TOTAL TIME TAKING CARE OF THIS PATIENT: 35 minutes.    Loletha Grayer M.D on 05/11/2016 at 10:33 AM  Between 7am to 6pm - Pager - [phone]  After 6pm go to [email or web address] - Proofreader  [facility]  [phone]  CC: Primary care physician; [facility]

## 2016-05-11 NOTE — Telephone Encounter (Signed)
Preliminary results of Right Breast Biopsy at 2:00 is invasive mammary carcinoma. She has ordered ER/PR and if negative, she will order another test to be sure it is not lung

## 2016-05-11 NOTE — Discharge Instructions (Signed)
Metastatic Cancer Introduction What increases the risk? Your risk for metastatic cancer depends on:  The type of cancer that you have.  The stage and grade of your primary cancer at the time of diagnosis. Tumors are graded by looking at tumor cells under a microscope. Grading predicts how quickly the tumor cells will grow. Your health care provider will use the stage and the grade of your primary cancer to determine the chances of metastasis. This helps your health care provider to find the best treatment for you.  Risk for metastasis may go up with:  A larger primary tumor.  A higher grade of tumor.  Deeper growth of tumor.  Lymph node involvement. How is this diagnosed? Your health care provider may suspect metastatic cancer from your signs and symptoms. Some people do not have any symptoms. Their cancer is found through imaging or other tests.  Symptoms may include:  Weakness.  Lack of energy.  Pain.  Weight loss.  Trouble breathing.  Signs may include:  Fluid buildup in your lungs or belly.  Tumor growths that can be felt or seen.  An enlarged liver. Your health care provider will also do a physical exam. This may include: How is this treated? There are many options for treating metastatic cancer. Your treatment will depend on:  The type of cancer that you have.  How far your cancer has advanced.  Your general health. Treatment may not be able to cure metastatic cancer, but it can often relieve the symptoms. In many cases, you may have a combination of treatments. Options may include:  Surgery.  Cancer-killing drugs (chemotherapy).  X-ray treatment (radiation therapy).  Hormone therapy.  Treatments that help your body to fight cancer (biologic therapy). How is this prevented? The only way to prevent metastatic cancer is to find your primary cancer early and treat it successfully. Talk with your health care provider about cancer screening. Screening  exams for early detection are available for some types of cancer, including:  Breast.  Colon.  Prostate.  Lung.  Cervical. Where to find more information: The following websites provide more information.  [place] ([place]) http://www.hicks.com/  [place] ([facility]) http://www.cancer.org/treatment/understandingyourdiagnosis/advancedcancer/advanced-cancer-what-is-metastatic  This information is not intended to replace advice given to you by your health care provider. Make sure you discuss any questions you have with your health care provider. Document Released: 09/06/2004 Document Revised: 11/19/2015 Document Reviewed: 08/15/2013  2017 Elsevier  Hypercalcemia Introduction Hypercalcemia is having too much calcium in the blood. The body needs calcium to make bones and keep them strong. Calcium also helps the muscles, nerves, brain, and heart work the way they should. Most of the calcium in the body is in the bones. There is also some calcium in the blood. Hypercalcemia can happen when calcium comes out of the bones, or when the kidneys are not able to remove calcium from the blood. Hypercalcemia can be mild or severe. What are the causes? There are many possible causes of hypercalcemia. Common causes include:  Hyperparathyroidism. This is a condition in which the body produces too much parathyroid hormone. There are four parathyroid glands in your neck. These glands produce a chemical messenger (hormone) that helps the body absorb calcium from foods and helps your bones release calcium.  Certain kinds of cancer, such as lung cancer, breast cancer, or myeloma. Less common causes of hypercalcemia include:  Getting too much calcium or vitamin D from your diet.  Kidney failure.  Hyperthyroidism.  Being on bed rest  for a long time.  Certain medicines.  Infections.  Sarcoidosis. What increases the  risk? This condition is more likely to develop in:  Women.  People who are 60 years or older.  People who have a family history of hypercalcemia. What are the signs or symptoms? Mild hypercalcemia that starts slowly may not cause symptoms. Severe, sudden hypercalcemia is more likely to cause symptoms, such as:  Loss of appetite.  Increased thirst and frequent urination.  Fatigue.  Nausea and vomiting.  Headache.  Abdominal pain.  Muscle pain, twitching, or weakness.  Constipation.  Blood in the urine.  Pain in the side of the back (flank pain).  Anxiety, confusion, or depression.  Irregular heartbeat (arrhythmia).  Loss of consciousness. How is this diagnosed? This condition may be diagnosed based on:  Your symptoms.  Blood tests.  Urine tests.  X-rays.  Ultrasound.  MRI.  CT scan. How is this treated? Treatment for hypercalcemia depends on the cause. Treatment may include:  Receiving fluids through an IV tube.  Medicines that keep calcium levels steady after receiving fluids (loop diuretics).  Medicines that keep calcium in your bones (bisphosphonates).  Medicines that lower the calcium level in your blood.  Surgery to remove overactive parathyroid glands. Follow these instructions at home:  Take over-the-counter and prescription medicines only as told by your health care provider.  Follow instructions from your health care provider about eating or drinking restrictions.  Drink enough fluid to keep your urine clear or pale yellow.  Stay active. Weight-bearing exercise helps to keep calcium in your bones. Follow instructions from your health care provider about what type and level of exercise is safe for you.  Keep all follow-up visits as told by your health care provider. This is important. Contact a health care provider if:  You have a fever.  You have flank or abdominal pain that is getting worse. Get help right away if:  You have  severe abdominal or flank pain.  You have chest pain.  You have trouble breathing.  You become very confused and sleepy.  You lose consciousness. This information is not intended to replace advice given to you by your health care provider. Make sure you discuss any questions you have with your health care provider. Document Released: 07/16/2004 Document Revised: 10/08/2015 Document Reviewed: 09/17/2014  2017 Elsevier

## 2016-05-11 NOTE — Clinical Social Work Note (Signed)
Patient to be d/c'ed today to [facility]  Patient and family agreeable to plans will transport via [place] transportation RN to call report to [phone].  Evette Cristal, MSW, LCSWA Mon-Fri 8a-4:30p [phone]

## 2016-05-13 LAB — SURGICAL PATHOLOGY

## 2016-05-17 ENCOUNTER — Inpatient Hospital Stay: Payer: Medicare (Managed Care) | Admitting: Oncology

## 2016-06-17 ENCOUNTER — Emergency Department: Payer: Medicare (Managed Care)

## 2016-06-17 ENCOUNTER — Encounter: Payer: Self-pay | Admitting: Emergency Medicine

## 2016-06-17 ENCOUNTER — Inpatient Hospital Stay
Admission: EM | Admit: 2016-06-17 | Discharge: 2016-06-19 | DRG: 055 | Disposition: A | Discharge status: Skilled Nursing Facility | Payer: Medicare (Managed Care) | Attending: Internal Medicine | Admitting: Internal Medicine

## 2016-06-17 DIAGNOSIS — E1142 Type 2 diabetes mellitus with diabetic polyneuropathy: Secondary | ICD-10-CM | POA: Diagnosis present

## 2016-06-17 DIAGNOSIS — H9190 Unspecified hearing loss, unspecified ear: Secondary | ICD-10-CM | POA: Diagnosis present

## 2016-06-17 DIAGNOSIS — E1151 Type 2 diabetes mellitus with diabetic peripheral angiopathy without gangrene: Secondary | ICD-10-CM | POA: Diagnosis present

## 2016-06-17 DIAGNOSIS — Z882 Allergy status to sulfonamides status: Secondary | ICD-10-CM | POA: Diagnosis not present

## 2016-06-17 DIAGNOSIS — Z951 Presence of aortocoronary bypass graft: Secondary | ICD-10-CM

## 2016-06-17 DIAGNOSIS — C7951 Secondary malignant neoplasm of bone: Secondary | ICD-10-CM | POA: Diagnosis present

## 2016-06-17 DIAGNOSIS — M6281 Muscle weakness (generalized): Secondary | ICD-10-CM

## 2016-06-17 DIAGNOSIS — G4730 Sleep apnea, unspecified: Secondary | ICD-10-CM | POA: Diagnosis present

## 2016-06-17 DIAGNOSIS — Z955 Presence of coronary angioplasty implant and graft: Secondary | ICD-10-CM

## 2016-06-17 DIAGNOSIS — I2510 Atherosclerotic heart disease of native coronary artery without angina pectoris: Secondary | ICD-10-CM | POA: Diagnosis present

## 2016-06-17 DIAGNOSIS — C7931 Secondary malignant neoplasm of brain: Secondary | ICD-10-CM | POA: Diagnosis present

## 2016-06-17 DIAGNOSIS — Z96653 Presence of artificial knee joint, bilateral: Secondary | ICD-10-CM | POA: Diagnosis present

## 2016-06-17 DIAGNOSIS — F329 Major depressive disorder, single episode, unspecified: Secondary | ICD-10-CM | POA: Diagnosis present

## 2016-06-17 DIAGNOSIS — C50919 Malignant neoplasm of unspecified site of unspecified female breast: Secondary | ICD-10-CM | POA: Diagnosis present

## 2016-06-17 DIAGNOSIS — Z888 Allergy status to other drugs, medicaments and biological substances status: Secondary | ICD-10-CM

## 2016-06-17 DIAGNOSIS — J449 Chronic obstructive pulmonary disease, unspecified: Secondary | ICD-10-CM | POA: Diagnosis present

## 2016-06-17 DIAGNOSIS — R471 Dysarthria and anarthria: Secondary | ICD-10-CM | POA: Diagnosis present

## 2016-06-17 DIAGNOSIS — Z7902 Long term (current) use of antithrombotics/antiplatelets: Secondary | ICD-10-CM

## 2016-06-17 DIAGNOSIS — R4781 Slurred speech: Secondary | ICD-10-CM | POA: Diagnosis present

## 2016-06-17 DIAGNOSIS — I10 Essential (primary) hypertension: Secondary | ICD-10-CM | POA: Diagnosis present

## 2016-06-17 DIAGNOSIS — E785 Hyperlipidemia, unspecified: Secondary | ICD-10-CM | POA: Diagnosis present

## 2016-06-17 DIAGNOSIS — K219 Gastro-esophageal reflux disease without esophagitis: Secondary | ICD-10-CM | POA: Diagnosis present

## 2016-06-17 DIAGNOSIS — I252 Old myocardial infarction: Secondary | ICD-10-CM | POA: Diagnosis not present

## 2016-06-17 DIAGNOSIS — I639 Cerebral infarction, unspecified: Secondary | ICD-10-CM

## 2016-06-17 DIAGNOSIS — G459 Transient cerebral ischemic attack, unspecified: Secondary | ICD-10-CM

## 2016-06-17 LAB — CBC
HCT: 36.8 % (ref 35.0–47.0)
HEMOGLOBIN: 12.4 g/dL (ref 12.0–16.0)
MCH: 30.5 pg (ref 26.0–34.0)
MCHC: 33.8 g/dL (ref 32.0–36.0)
MCV: 90.4 fL (ref 80.0–100.0)
PLATELETS: 223 10*3/uL (ref 150–440)
RBC: 4.07 MIL/uL (ref 3.80–5.20)
RDW: 14 % (ref 11.5–14.5)
WBC: 5 10*3/uL (ref 3.6–11.0)

## 2016-06-17 LAB — BASIC METABOLIC PANEL
ANION GAP: 6 (ref 5–15)
BUN: 25 mg/dL — ABNORMAL HIGH (ref 6–20)
CHLORIDE: 102 mmol/L (ref 101–111)
CO2: 30 mmol/L (ref 22–32)
CREATININE: 0.67 mg/dL (ref 0.44–1.00)
Calcium: 9 mg/dL (ref 8.9–10.3)
GFR calc non Af Amer: >60 mL/min (ref >60)
Glucose, Bld: 104 mg/dL — ABNORMAL HIGH (ref 65–99)
Potassium: 4 mmol/L (ref 3.5–5.1)
SODIUM: 138 mmol/L (ref 135–145)

## 2016-06-17 LAB — TROPONIN I

## 2016-06-17 LAB — MRSA PCR SCREENING: MRSA by PCR: NEGATIVE

## 2016-06-17 MED ORDER — ASPIRIN 81 MG PO CHEW
324.0000 mg | CHEWABLE_TABLET | Freq: Once | ORAL | Status: AC
  Administered 2016-06-17: 324 mg via ORAL
  Filled 2016-06-17: qty 4

## 2016-06-17 MED ORDER — IPRATROPIUM-ALBUTEROL 0.5-2.5 (3) MG/3ML IN SOLN
3.0000 mL | Freq: Four times a day (QID) | RESPIRATORY_TRACT | Status: DC
  Administered 2016-06-18 (×2): 3 mL via RESPIRATORY_TRACT
  Filled 2016-06-17 (×2): qty 3

## 2016-06-17 MED ORDER — OXYCODONE-ACETAMINOPHEN 5-325 MG PO TABS
1.0000 | ORAL_TABLET | Freq: Four times a day (QID) | ORAL | Status: DC | PRN
Start: 2016-06-17 — End: 2016-06-19

## 2016-06-17 MED ORDER — PREGABALIN 75 MG PO CAPS
150.0000 mg | ORAL_CAPSULE | Freq: Every day | ORAL | Status: DC
  Administered 2016-06-17 – 2016-06-18 (×2): 150 mg via ORAL
  Filled 2016-06-17 (×2): qty 2

## 2016-06-17 MED ORDER — CLOPIDOGREL BISULFATE 75 MG PO TABS
75.0000 mg | ORAL_TABLET | Freq: Every day | ORAL | Status: DC
  Administered 2016-06-18 – 2016-06-19 (×2): 75 mg via ORAL
  Filled 2016-06-17 (×2): qty 1

## 2016-06-17 MED ORDER — PANTOPRAZOLE SODIUM 40 MG PO TBEC
40.0000 mg | DELAYED_RELEASE_TABLET | Freq: Every day | ORAL | Status: DC
  Administered 2016-06-18 – 2016-06-19 (×2): 40 mg via ORAL
  Filled 2016-06-17 (×2): qty 1

## 2016-06-17 MED ORDER — POTASSIUM CHLORIDE CRYS ER 20 MEQ PO TBCR
10.0000 meq | EXTENDED_RELEASE_TABLET | Freq: Every day | ORAL | Status: DC
  Administered 2016-06-17 – 2016-06-19 (×3): 10 meq via ORAL
  Filled 2016-06-17 (×3): qty 1

## 2016-06-17 MED ORDER — STROKE: EARLY STAGES OF RECOVERY BOOK
Freq: Once | Status: AC
  Administered 2016-06-17: 17:00:00

## 2016-06-17 MED ORDER — POLYETHYLENE GLYCOL 3350 17 G PO PACK
17.0000 g | PACK | Freq: Every day | ORAL | Status: DC
  Administered 2016-06-18 – 2016-06-19 (×2): 17 g via ORAL
  Filled 2016-06-17 (×3): qty 1

## 2016-06-17 MED ORDER — PRAVASTATIN SODIUM 40 MG PO TABS
80.0000 mg | ORAL_TABLET | Freq: Every day | ORAL | Status: DC
  Administered 2016-06-17 – 2016-06-18 (×2): 80 mg via ORAL
  Filled 2016-06-17 (×2): qty 1

## 2016-06-17 MED ORDER — FLUOXETINE HCL 20 MG PO CAPS
40.0000 mg | ORAL_CAPSULE | Freq: Every day | ORAL | Status: DC
  Administered 2016-06-18 – 2016-06-19 (×2): 40 mg via ORAL
  Filled 2016-06-17 (×2): qty 2

## 2016-06-17 MED ORDER — FAMOTIDINE 20 MG PO TABS
20.0000 mg | ORAL_TABLET | Freq: Every day | ORAL | Status: DC
  Administered 2016-06-18 – 2016-06-19 (×2): 20 mg via ORAL
  Filled 2016-06-17 (×2): qty 1

## 2016-06-17 MED ORDER — FUROSEMIDE 20 MG PO TABS
10.0000 mg | ORAL_TABLET | Freq: Every day | ORAL | Status: DC
  Administered 2016-06-18 – 2016-06-19 (×2): 10 mg via ORAL
  Filled 2016-06-17: qty 2
  Filled 2016-06-17: qty 1

## 2016-06-17 NOTE — H&P (Signed)
[facility] NAME: Jeanette White    MR#:  [id number]  DATE OF BIRTH:  [birth year]  DATE OF ADMISSION:  06/17/2016  PRIMARY CARE PHYSICIAN: No primary care provider on file.   REQUESTING/REFERRING PHYSICIAN: Dr.Kevin Paduchowski  ,slurred speech   Chief Complaint  Patient presents with  . Loss of Consciousness    HISTORY OF PRESENT ILLNESS:  Jeanette White  is a 83 y.o. female with a known history of Essential hypertension, coronary artery disease, it is post CABG before, stage IV breast cancer [facility] with [facility] comes in because of slurred speech, syncope. Noted to have slurred speech and facial droop by pace Physician,   She  is wheelchair-bound at baseline, no weakness or numbness in hands or legs.  PAST MEDICAL HISTORY:   Past Medical History:  Diagnosis Date  . Anginal pain (HCC)    STABLE  . Arthritis   . Asthma    CHRONIC OBSTRUCTIVE  . B12 deficiency   . Complication of anesthesia    SLOW TO WAKE UP  . Coronary artery disease   . Depression    MAJOR  . Diabetes mellitus without complication ([place])   . Edema    HANDS/FEET  . GERD (gastroesophageal reflux disease)   . HOH (hard of hearing)   . Hypertension   . Myocardial infarction   . Peripheral vascular disease ([place])   . Polyneuropathy ([place])   . Renal anomaly    ARTERY STENOSIS  . Rhinitis, allergic    CHRONIC  . Shoulder disorder    ROTATOR CUFF TEAR  LEFT  . Sleep apnea   . Spinal disorder    STENOSIS    PAST SURGICAL HISTOIRY:   Past Surgical History:  Procedure Laterality Date  . APPENDECTOMY    . CATARACT EXTRACTION W/PHACO Left 12/09/2015   Procedure: CATARACT EXTRACTION PHACO AND INTRAOCULAR LENS PLACEMENT (IOC);  Surgeon: Estill Cotta, MD;  Location: [facility] ORS;  Service: Ophthalmology;  Laterality: Left;  [place] 01:21 AP% 22.8 CDE 31.49 fluid pack lot # [id number]  . CHOLECYSTECTOMY    . CORONARY ANGIOPLASTY     STENT   . CORONARY ARTERY BYPASS GRAFT    . TOTAL KNEE ARTHROPLASTY Bilateral     SOCIAL HISTORY:   Social History  Substance Use Topics  . Smoking status: Never Smoker  . Smokeless tobacco: Never Used  . Alcohol use No    FAMILY HISTORY:   Family History  Problem Relation Age of Onset  . Alzheimer's disease Sister   . Arthritis Sister     DRUG ALLERGIES:   Allergies  Allergen Reactions  . Ace Inhibitors   . Sulfa Antibiotics     REVIEW OF SYSTEMS:  CONSTITUTIONAL: No fever, fatigue or weakness.  EYES: No blurred or double vision.  EARS, NOSE, AND THROAT: No tinnitus or ear pain.  RESPIRATORY: No cough, shortness of breath, wheezing or hemoptysis.  CARDIOVASCULAR: No chest pain, orthopnea, edema.  GASTROINTESTINAL: No nausea, vomiting, diarrhea or abdominal pain.  GENITOURINARY: No dysuria, hematuria.  ENDOCRINE: No polyuria, nocturia,  HEMATOLOGY: No anemia, easy bruising or bleeding SKIN: No rash or lesion. MUSCULOSKELETAL: No joint pain or arthritis.   NEUROLOGIC: No tingling, numbness, weakness. Syncope and slurred speech today. PSYCHIATRY: No anxiety or depression.   MEDICATIONS AT HOME:   Prior to Admission medications   Medication Sig Start Date End Date Taking? Authorizing Provider  clopidogrel (PLAVIX) 75 MG tablet Take 75 mg  by mouth daily.   Yes Historical Provider, MD  ergocalciferol (VITAMIN D2) 50000 units capsule Take 50,000 Units by mouth once a week.   Yes Historical Provider, MD  FLUoxetine (PROZAC) 40 MG capsule Take 40 mg by mouth daily.   Yes Historical Provider, MD  furosemide (LASIX) 20 MG tablet Take 20 mg by mouth daily.    Yes Historical Provider, MD  ipratropium-albuterol (DUONEB) 0.5-2.5 (3) MG/3ML SOLN Take 3 mLs by nebulization every 6 (six) hours. 05/11/16  Yes Loletha Grayer, MD  letrozole [facility]) 2.5 MG tablet Take 2.5 mg by mouth daily.   Yes Historical Provider, MD  nitroGLYCERIN (NITROSTAT) 0.4 MG SL tablet Place 0.4 mg under the  tongue every 5 (five) minutes as needed for chest pain.   Yes Historical Provider, MD  nystatin (MYCOSTATIN/NYSTOP) powder Apply topically 3 (three) times daily. 05/11/16  Yes Richard Leslye Peer, MD  pantoprazole (PROTONIX) 40 MG tablet Take 40 mg by mouth daily.    Yes Historical Provider, MD  polyethylene glycol (MIRALAX / GLYCOLAX) packet Take 17 g by mouth daily as needed. 05/11/16  Yes Loletha Grayer, MD  potassium chloride (K-DUR) 10 MEQ tablet Take 10-20 mEq by mouth 2 (two) times daily. 2 tablets in the morning and 1 tablet at bedtime   Yes Historical Provider, MD  pravastatin (PRAVACHOL) 80 MG tablet Take 80 mg by mouth at bedtime.    Yes Historical Provider, MD  pregabalin (LYRICA) 150 MG capsule Take 150 mg by mouth at bedtime.   Yes Historical Provider, MD  ranitidine (ZANTAC) 150 MG tablet Take 150 mg by mouth daily.   Yes Historical Provider, MD  valsartan-hydrochlorothiazide (DIOVAN-HCT) 80-12.5 MG tablet Take 1 tablet by mouth daily.   Yes Historical Provider, MD  oxyCODONE-acetaminophen (PERCOCET/ROXICET) 5-325 MG tablet Take 1 tablet by mouth every 6 (six) hours as needed for moderate pain or severe pain. Patient not taking: Reported on 06/17/2016 05/11/16   Loletha Grayer, MD  potassium chloride (K-DUR,KLOR-CON) 10 MEQ tablet Take 1 tablet (10 mEq total) by mouth daily. 2 AM 1 HS Patient not taking: Reported on 06/17/2016 05/11/16   Loletha Grayer, MD      VITAL SIGNS:  Blood pressure 134/70, pulse (!) 51, temperature 97.4 F (36.3 C), temperature source Oral, resp. rate 16, weight 105.7 kg (233 lb 0.4 oz), SpO2 100 %.  PHYSICAL EXAMINATION:  GENERAL:  83 y.o.-year-old patient lying in the bed with no acute distress.  EYES: Pupils equal, round, reactive to light and accommodation. No scleral icterus. Extraocular muscles intact.  HEENT: Head atraumatic, normocephalic. Oropharynx and nasopharynx clear.  NECK:  Supple, no jugular venous distention. No thyroid enlargement, no  tenderness.  LUNGS: Normal breath sounds bilaterally, no wheezing, rales,rhonchi or crepitation. No use of accessory muscles of respiration.  CARDIOVASCULAR: S1, S2 normal. No murmurs, rubs, or gallops.  ABDOMEN: Soft, nontender, nondistended. Bowel sounds present. No organomegaly or mass.  EXTREMITIES: No pedal edema, cyanosis, or clubbing.  NEUROLOGIC: Cranial nerves II through XII are intact. Muscle strength 5/5 in all extremities. Sensation intact. Gait not checked.  PSYCHIATRIC: The patient is alert and oriented x 3.  SKIN: No obvious rash, lesion, or ulcer.   LABORATORY PANEL:   CBC  Recent Labs Lab 06/17/16 1448  WBC 5.0  HGB 12.4  HCT 36.8  PLT 223   ------------------------------------------------------------------------------------------------------------------  Chemistries   Recent Labs Lab 06/17/16 1448  NA 138  K 4.0  CL 102  CO2 30  GLUCOSE 104*  BUN 25*  CREATININE  0.67  CALCIUM 9.0   ------------------------------------------------------------------------------------------------------------------  Cardiac Enzymes  Recent Labs Lab 06/17/16 1448  TROPONINI <0.03   ------------------------------------------------------------------------------------------------------------------  RADIOLOGY:  Ct Head Wo Contrast  Result Date: 06/17/2016 CLINICAL DATA:  Syncopal episode with slurred speech EXAM: CT HEAD WITHOUT CONTRAST TECHNIQUE: Contiguous axial images were obtained from the base of the skull through the vertex without intravenous contrast. COMPARISON:  09/28/2012 FINDINGS: Brain: No evidence of acute infarction, hemorrhage, hydrocephalus, extra-axial collection or mass lesion/mass effect. Mild atrophic changes are noted. Vascular: No hyperdense vessel or unexpected calcification. Skull: Normal. Negative for fracture or focal lesion. Sinuses/Orbits: No acute finding. Other: None. IMPRESSION: Mild atrophic changes without acute abnormality. Electronically  Signed   By: Inez Catalina M.D.   On: 06/17/2016 15:25    EKG:   Orders placed or performed during the hospital encounter of 05/07/16  . ED EKG  . ED EKG  sinus rhythm 80 bpm with AV block  IMPRESSION AND PLAN:  [age] year [age] female patient with slurred speech, syncope: Admit to observation status for evaluation of TIA, CVA; CT head unremarkable, check MRI, MRA of the brain, ultrasound of carotids, echocardiogram, continue aspirin, Plavix, beta blockers, statins. Patient's symptoms completely resolved at this time. 2.metastaticbreast cancer;continue letrozole 3.CAD,s/p CABG 4.Depression;  All the records are reviewed and case discussed with ED provider. Management plans discussed with the patient, family and they are in agreement.  CODE STATUS: DNR  TOTAL TIME TAKING CARE OF THIS PATIENT: 55 minutes.    Epifanio Lesches M.D on 06/17/2016 at 5:01 PM  Between 7am to 6pm - Pager - [phone]  After 6pm go to [email or web address] - password [email or web address] [facility]  [facility]  Office  [phone]  CC: Primary care physician; No primary care provider on file.  Note: This dictation was prepared with Dragon dictation along with smaller phrase technology. Any transcriptional errors that result from this process are unintentional.

## 2016-06-17 NOTE — ED Notes (Signed)
Patient transported to Ultrasound 

## 2016-06-17 NOTE — ED Triage Notes (Signed)
Patient brought to the ED via [facility] from [facility]. Per EMS, staff at [facility] reports patient had an episode around 11 am today where she loss consciousness, when she came to patient had slurred speech and her tongue was hanging out of her mouth on the left side. Patient currently A & O x 4, no facial droop noted, grips equal bilaterally.

## 2016-06-17 NOTE — ED Provider Notes (Signed)
[facility] Emergency Department Provider Note  Time seen: 2:47 PM  I have reviewed the triage vital signs and the nursing notes.   HISTORY  Chief Complaint Loss of Consciousness    HPI Jeanette White is a 83 y.o. female with a past medical history of asthma, angina, diabetes, hypertension, MI, recently diagnosed metastatic breast cancer presents to the emergency department with difficulty speaking. According to the patient around 11 AM this morning she was having difficulty speaking but she could not get her words out, and the warts that did come out were garbled.  The patient states this lasted approximately 30 minutes to an hour and then resolved. Patient called her primary care physician through pace, who spoke with me in the emergency department and we ultimately decided to have the patient come to the emergency department for a workup given the likelihood of TIA versus metastatic disease. Upon arrival the patient appears well, in no distress, no difficulty speaking, denies any focal weakness or numbness of any arm or leg and any point today. Does state earlier today she was having a headache but that has since resolved. Denies any chest pain or shortness of breath. Denies any history of mini stroke or stroke in the past.  Past Medical History:  Diagnosis Date  . Anginal pain (HCC)    STABLE  . Arthritis   . Asthma    CHRONIC OBSTRUCTIVE  . B12 deficiency   . Complication of anesthesia    SLOW TO WAKE UP  . Coronary artery disease   . Depression    MAJOR  . Diabetes mellitus without complication ([place])   . Edema    HANDS/FEET  . GERD (gastroesophageal reflux disease)   . HOH (hard of hearing)   . Hypertension   . Myocardial infarction   . Peripheral vascular disease ([place])   . Polyneuropathy ([place])   . Renal anomaly    ARTERY STENOSIS  . Rhinitis, allergic    CHRONIC  . Shoulder disorder    ROTATOR CUFF TEAR  LEFT  . Sleep apnea   . Spinal  disorder    STENOSIS    Patient Active Problem List   Diagnosis Date Noted  . Hypercalcemia 05/07/2016  . Pulmonary nodules/lesions, multiple 05/07/2016    Past Surgical History:  Procedure Laterality Date  . APPENDECTOMY    . CATARACT EXTRACTION W/PHACO Left 12/09/2015   Procedure: CATARACT EXTRACTION PHACO AND INTRAOCULAR LENS PLACEMENT (IOC);  Surgeon: Estill Cotta, MD;  Location: [facility] ORS;  Service: Ophthalmology;  Laterality: Left;  [place] 01:21 AP% 22.8 CDE 31.49 fluid pack lot # [id number]  . CHOLECYSTECTOMY    . CORONARY ANGIOPLASTY     STENT  . CORONARY ARTERY BYPASS GRAFT    . TOTAL KNEE ARTHROPLASTY Bilateral     Prior to Admission medications   Medication Sig Start Date End Date Taking? Authorizing Provider  clopidogrel (PLAVIX) 75 MG tablet Take 75 mg by mouth daily.    Historical Provider, MD  FLUoxetine (PROZAC) 40 MG capsule Take 40 mg by mouth daily.    Historical Provider, MD  furosemide (LASIX) 20 MG tablet Take 10 mg by mouth daily.    Historical Provider, MD  ipratropium-albuterol (DUONEB) 0.5-2.5 (3) MG/3ML SOLN Take 3 mLs by nebulization every 6 (six) hours. 05/11/16   Loletha Grayer, MD  nystatin (MYCOSTATIN/NYSTOP) powder Apply topically 3 (three) times daily. 05/11/16   Loletha Grayer, MD  oxyCODONE-acetaminophen (PERCOCET/ROXICET) 5-325 MG tablet Take 1 tablet by mouth every  6 (six) hours as needed for moderate pain or severe pain. 05/11/16   Loletha Grayer, MD  pantoprazole (PROTONIX) 40 MG tablet Take 40 mg by mouth daily.     Historical Provider, MD  polyethylene glycol (MIRALAX / GLYCOLAX) packet Take 17 g by mouth daily as needed. 05/11/16   Loletha Grayer, MD  potassium chloride (K-DUR,KLOR-CON) 10 MEQ tablet Take 1 tablet (10 mEq total) by mouth daily. 2 AM 1 HS 05/11/16   Loletha Grayer, MD  pravastatin (PRAVACHOL) 80 MG tablet Take 80 mg by mouth at bedtime.     Historical Provider, MD  pregabalin (LYRICA) 150 MG capsule Take 150 mg by mouth  at bedtime.    Historical Provider, MD  ranitidine (ZANTAC) 150 MG tablet Take 150 mg by mouth daily.    Historical Provider, MD    Allergies  Allergen Reactions  . Ace Inhibitors   . Sulfa Antibiotics     Family History  Problem Relation Age of Onset  . Alzheimer's disease Sister   . Arthritis Sister     Social History Social History  Substance Use Topics  . Smoking status: Never Smoker  . Smokeless tobacco: Never Used  . Alcohol use No    Review of Systems Constitutional: Negative for fever. Cardiovascular: Negative for chest pain. Respiratory: Negative for shortness of breath. Gastrointestinal: Negative for abdominal pain, vomiting  Genitourinary: Negative for dysuria. Neurological: Mild headache earlier today now resolved. Denies focal weakness or numbness. Does state difficulty speaking around 11 AM this has since resolved. 10-point ROS otherwise negative.  ____________________________________________   PHYSICAL EXAM:  VITAL SIGNS: ED Triage Vitals [06/17/16 1437]  Enc Vitals Group     BP      Pulse Rate (!) 51     Resp 16     Temp 97.4 F (36.3 C)     Temp Source Oral     SpO2 100 %     Weight 233 lb 0.4 oz (105.7 kg)     Height      Head Circumference      Peak Flow      Pain Score 3     Pain Loc      Pain Edu?      Excl. in [place]?     Constitutional: Alert and oriented. Well appearing and in no distress. Eyes: Normal exam ENT   Head: Normocephalic and atraumatic.   Mouth/Throat: Mucous membranes are moist. Cardiovascular: Normal rate, regular rhythm. No murmur Respiratory: Normal respiratory effort without tachypnea nor retractions. Breath sounds are clear  Gastrointestinal: Soft and nontender. No distention.  Musculoskeletal: Nontender with normal range of motion in all extremities.  Neurologic:  Normal speech and language. No gross focal neurologic deficits Skin:  Skin is warm, dry and intact.  Psychiatric: Mood and affect are  normal.  ____________________________________________    EKG  EKG reviewed and interpreted by myself shows normal sinus rhythm at 55 bpm, narrow QRS, normal axis, normal intervals with no concerning ST changes.  ____________________________________________    RADIOLOGY  CT negative  ____________________________________________   INITIAL IMPRESSION / ASSESSMENT AND PLAN / ED COURSE  Pertinent labs & imaging results that were available during my care of the patient were reviewed by me and considered in my medical decision making (see chart for details).  The patient presents to the emergency department with dysarthria which is now resolved. Patient was recently diagnosed with metastatic breast cancer. This would be concerning for possible brain metastases versus mini stroke. She  is symptom-free at this time with a normal/intact neurological exam. We'll obtain a CT scan as well as lab work and continue to closely monitor.  CT scan is negative. EKG appears to show sinus rhythm. Patient appears to be suffering from a legitimate TIA with symptom resolution at this time. We will admit to the hospital for TIA workup.  ____________________________________________   FINAL CLINICAL IMPRESSION(S) / ED DIAGNOSES  Dysarthria TIA    Harvest Dark, MD 06/17/16 [phone]

## 2016-06-17 NOTE — ED Notes (Signed)
Called to give report, nurse is in isolation room, will return call when available.

## 2016-06-18 ENCOUNTER — Inpatient Hospital Stay: Payer: Medicare (Managed Care)

## 2016-06-18 LAB — LIPID PANEL
CHOL/HDL RATIO: 3.4 ratio
CHOLESTEROL: 127 mg/dL (ref 0–200)
HDL: 37 mg/dL — ABNORMAL LOW (ref >40)
LDL Cholesterol: 70 mg/dL (ref 0–99)
Triglycerides: 102 mg/dL (ref <150)
VLDL: 20 mg/dL (ref 0–40)

## 2016-06-18 MED ORDER — IPRATROPIUM-ALBUTEROL 0.5-2.5 (3) MG/3ML IN SOLN: 3.0000 mL | Freq: Four times a day (QID) | RESPIRATORY_TRACT | Status: DC | PRN

## 2016-06-18 MED ORDER — SODIUM CHLORIDE 0.9 % IV BOLUS (SEPSIS)
250.0000 mL | Freq: Once | INTRAVENOUS | Status: AC
  Administered 2016-06-18: 01:00:00 250 mL via INTRAVENOUS

## 2016-06-18 NOTE — Progress Notes (Signed)
BP 91/33 by NT. Repeated BP with larger cuff and pt sitting up 88/64.  Pt falls asleep easily but is easily woken up.  NIH (1), neuro checks with no change.  Text message to [facility] doc with vitals. Dorna Bloom RN

## 2016-06-18 NOTE — Progress Notes (Signed)
Patient sat out in chair this afternoon. VS stable. Alert and oriented, neuro intact. States she feels she is back to "normal." No complaints of discomfort. Tele SR 1st degree. Visitors at bedside.

## 2016-06-18 NOTE — Progress Notes (Signed)
10:30 Tech from MRI called. Stated that upon transfer from bed to wheelchair during pivot motion, patients legs became weal and tech help patient slide down to floor. No injures noted. Patient fine. On return to nursing unit - VS WNL - patient had PT and is sitting in chair. Denies any discomfort.

## 2016-06-18 NOTE — Progress Notes (Signed)
SLP Cancellation Note  Patient Details Name: Jeanette White MRN: [id number] DOB: 1928-03-27   Cancelled treatment:       Reason Eval/Treat Not Completed: SLP screened, no needs identified, will sign off (chart reviewed; NSG and pt consulted). NSG denied any swallowing difficulties w/ Pills. Pt stated she was eating/drinking "fine" - pt consumed some of breakfast meal while in room w/ no overt difficulties noted. Pt conversed at conversational level w/ SLP w/ no apparent language deficits noted. Pt appears at her baseline.  No further skilled ST services indicated at this time. NSG to reconsult if any change in status while admitted.    Orinda Kenner, MS, CCC-SLP Seichi Kaufhold 06/18/2016, 12:04 PM

## 2016-06-18 NOTE — Plan of Care (Signed)
Problem: Education: Goal: Knowledge of disease or condition will improve Outcome: Progressing Patient educated re- signs and symptoms of stroke. Educated about causes.  Problem: Tissue Perfusion: Goal: Cerebral tissue perfusion will improve (applicable to all stroke diagnoses) Outcome: Progressing Ted hose applied, patient up to chair today. Range of motion.

## 2016-06-18 NOTE — Progress Notes (Signed)
The Nurse paged [facility] stating that the Pt in Rm 119 needed emotional support after she received the bad news. [place] visited Pt who appeared to be depressed but try to avoid showing tears. Pt has cancer in her brain scalp and has 3 months to live. Pt had a great conversation with [place] that covered a range of topics, life, marriage, estranged relationship with her last son, and her desire to organize her life before she dies. Pt told [place] that though she does not have a church home, her faith in [place] has helped her cope with illness. Pt requested for prayers, which the [facility] provided. Pt appeared relieved after prayer and was expecting to the discharge the following day.    06/18/16 2000  Clinical Encounter Type  Visited With Patient;Health care provider  Visit Type Initial;Spiritual support;Social support;Other (Comment)  Referral From Nurse  Consult/Referral To Chaplain  Spiritual Encounters  Spiritual Needs Prayer;Emotional;Other (Comment)

## 2016-06-18 NOTE — Progress Notes (Signed)
1330 - Patient seen by MD - afterward patient was upset due to news re- her medical status. [place] called to support patient.

## 2016-06-18 NOTE — Evaluation (Signed)
Physical Therapy Evaluation Patient Details Name: Jeanette White MRN: [id number] DOB: 1927-07-04 Today's Date: 06/18/2016   History of Present Illness  Pt. is an 83 y.o. female who was admitted to [facility]  Pt is up to stand and pivot to chair with dramatic effort by PT and with some pivot help of another.  Will need to be assisted with transfers with total lift of [place], and informed nursing to assist back to bed.  Pt is appropriate for acute follow up to restore PLOF but I also expect her state of decline to need SNF care for better transfers on the way home.      Follow Up Recommendations SNF    Equipment Recommendations  None recommended by PT    Recommendations for Other Services       Precautions / Restrictions Precautions Precautions: Fall (telemetry) Restrictions Weight Bearing Restrictions: No      Mobility  Bed Mobility Overal bed mobility: Needs Assistance Bed Mobility: Supine to Sit     Supine to sit: Mod assist     General bed mobility comments: PT assisted under trunk due to limits of pt flexion to reach rails to help herselfr  Transfers Overall transfer level: Needs assistance Equipment used: 1 person hand held assist Transfers: Sit to/from Stand;Stand Pivot Transfers Sit to Stand: Max assist Stand pivot transfers: Max assist;+2 physical assistance;+2 safety/equipment;From elevated surface       General transfer comment: Pt cannot pivot well and will need lift for transition to chair  Ambulation/Gait             General Gait Details: non ambulatory  Stairs            Wheelchair Mobility    Modified Rankin (Stroke Patients Only)       Balance Overall balance assessment: Needs assistance;History of Falls Sitting-balance support: Feet supported Sitting balance-Leahy Scale: Fair     Standing balance support: Bilateral upper extremity supported Standing balance-Leahy Scale: Poor                                Pertinent Vitals/Pain Pain Assessment: No/denies pain    Home Living Family/patient expects to be discharged to:: Private residence (apartment with [facility] services including physical therapy) Living Arrangements: Alone Available Help at Discharge: Personal care attendant Type of Home: Apartment Home Access: Level entry     Home Layout: One level Home Equipment: Walker - 2 wheels;Wheelchair - manual      Prior Function Level of Independence: Needs assistance   Gait / Transfers Assistance Needed: wc mobility with assistance to transfer of one  ADL's / Homemaking Assistance Needed: Pt. has aides to assist with ADLs in a.m. and p.m. Pt. attends [facility] during the day.        Hand Dominance   Dominant Hand: Right    Extremity/Trunk Assessment   Upper Extremity Assessment Upper Extremity Assessment: Generalized weakness LUE Deficits / Details: L shoulder limited by stretch injury per pt    Lower Extremity Assessment Lower Extremity Assessment: Generalized weakness    Cervical / Trunk Assessment Cervical / Trunk Assessment: Normal  Communication   Communication: No difficulties  Cognition Arousal/Alertness: Awake/alert Behavior During Therapy: WFL for tasks assessed/performed Overall Cognitive Status: Within Functional Limits for tasks assessed                      General Comments  Exercises     Assessment/Plan    PT Assessment Patient needs continued PT services  PT Problem List Decreased strength;Decreased range of motion;Decreased activity tolerance;Decreased balance;Decreased mobility;Decreased coordination;Decreased knowledge of use of DME;Decreased safety awareness;Cardiopulmonary status limiting activity;Obesity;Decreased skin integrity;Pain          PT Treatment Interventions DME instruction;Functional mobility training;Therapeutic activities;Therapeutic exercise;Balance training;Neuromuscular re-education;Patient/family  education    PT Goals (Current goals can be found in the Care Plan section)  Acute Rehab PT Goals Patient Stated Goal: to be safe to get OOB PT Goal Formulation: With patient Potential to Achieve Goals: Good    Frequency Min 2X/week   Barriers to discharge Decreased caregiver support home alone at times    Co-evaluation               End of Session Equipment Utilized During Treatment: Gait belt Activity Tolerance: Patient limited by fatigue;Other (comment) (generalized weakness) Patient left: in chair;with call bell/phone within reach;Other (comment) (with feet elevated and with ST evaluating her) Nurse Communication: Mobility status         Time: 1010-1057 PT Time Calculation (min) (ACUTE ONLY): 47 min   Charges:   PT Evaluation $PT Eval Moderate Complexity: 1 Procedure PT Treatments $Therapeutic Exercise: 8-22 mins $Therapeutic Activity: 8-22 mins   PT G Codes:        Ramond Dial 2016-07-08, 1:03 PM   Mee Hives, PT MS Acute Rehab Dept. Number: [place] [facility] [place]

## 2016-06-18 NOTE — Progress Notes (Signed)
Occupational Therapy Treatment Patient Details Name: Marquia Estela MRN: [id number] DOB: [birth year] Today's Date: 06/18/2016    History of present illness Pt. is an 83 y.o. female who was admitted to [facility] with slurred speech, and syncope. Pt. PMHx includes: Essential HTN, CAD, CABG, and Stage 4 breast CA.   OT comments  Pt. Is an 83 y.o. Female who was admitted to [facility] with slurred speech, and syncope. Pt. Presents with weakness, impaired functional mobility, and limited LUE ROM which hinders her ability to complete ADL functioning. Pt. Reports being w/c bound. Pt. Has personal care aides to assist with ADLs in a.m. and p.m.  Pt. Attends [facility] daily and was receiving rehab services. Pt. Could benefit from skilled OT services for ADL training, A/E training, UE there. Ex., positioning, and pt. education about home modification, and DME.     Follow Up Recommendations  Home health OT (Follow up with OT at [facility])    Equipment Recommendations       Recommendations for Other Services      Precautions / Restrictions Restrictions Weight Bearing Restrictions: No                                             ADL Overall ADL's : Needs assistance/impaired Eating/Feeding: Set up (Pt. was provided with built-up foam handles for utensils)   Grooming: Set up               Lower Body Dressing: Maximal assistance                 General ADL Comments: Pt. education was provided about general A/E use.      Vision                     Perception     Praxis      Cognition   Behavior During Therapy: WFL for tasks assessed/performed Overall Cognitive Status: Within Functional Limits for tasks assessed                       Extremity/Trunk Assessment  Upper Extremity Assessment Upper Extremity Assessment: Generalized weakness;LUE deficits/detail (Pt. has arthritis.) LUE Deficits / Details: Left shoulder limitations in flexion, and abduction from a  previous injury.             Exercises     Shoulder Instructions       General Comments      Pertinent Vitals/ Pain       Pain Assessment: No/denies pain  Home Living Family/patient expects to be discharged to:: Private residence (Independent apartment. Attends [facility] daily.) Living Arrangements: Alone Available Help at Discharge: Personal care attendant Type of Home: Apartment Home Access: Level entry     Home Layout: One level     Bathroom Shower/Tub:  (Pt. takes showers at [facility])         Home Equipment: Gilford Rile - 2 wheels;Wheelchair - manual          Prior Functioning/Environment Level of Independence: Needs assistance  Gait / Transfers Assistance Needed: Pt. is w/c bound however is able to transfer to and from the toilet. ADL's / Homemaking Assistance Needed: Pt. has aides to assist with ADLs in a.m. and p.m. Pt. attends [facility] during the day.       Frequency  Min 1X/week  Progress Toward Goals  OT Goals(current goals can now be found in the care plan section)        Plan      Co-evaluation                 End of Session     Activity Tolerance Patient tolerated treatment well   Patient Left in bed;with call bell/phone within reach;with bed alarm set   Nurse Communication          Time: [id number] OT Time Calculation (min): 18 min  Charges: OT General Charges $OT Visit: 1 Procedure OT Evaluation $OT Eval Moderate Complexity: 1 Procedure  Harrel Carina, MS, OTR/L 06/18/2016, 12:33 PM

## 2016-06-18 NOTE — Progress Notes (Signed)
[facility] [place] NAME: Jeanette White    MR#:  [id number]  DATE OF BIRTH:  [birth year]  SUBJECTIVE:  CHIEF COMPLAINT:   Chief Complaint  Patient presents with  . Loss of Consciousness     Brought with syncopal episode and some speech problems.   Feels fine now, but felt dizzi on standing up with PT today.   Have Hx of recently diagnosed metastatic breast cancer.  REVIEW OF SYSTEMS:  CONSTITUTIONAL: No fever,positive for fatigue or weakness.  EYES: No blurred or double vision.  EARS, NOSE, AND THROAT: No tinnitus or ear pain.  RESPIRATORY: No cough, shortness of breath, wheezing or hemoptysis.  CARDIOVASCULAR: No chest pain, orthopnea, edema.  GASTROINTESTINAL: No nausea, vomiting, diarrhea or abdominal pain.  GENITOURINARY: No dysuria, hematuria.  ENDOCRINE: No polyuria, nocturia,  HEMATOLOGY: No anemia, easy bruising or bleeding SKIN: No rash or lesion. MUSCULOSKELETAL: No joint pain or arthritis.   NEUROLOGIC: No tingling, numbness, weakness.  PSYCHIATRY: No anxiety or depression.   ROS  DRUG ALLERGIES:   Allergies  Allergen Reactions  . Ace Inhibitors   . Sulfa Antibiotics     VITALS:  Blood pressure 115/83, pulse 60, temperature 97.6 F (36.4 C), temperature source Oral, resp. rate 18, height 5\' 1"  (1.549 m), weight 105.7 kg (233 lb), SpO2 100 %.  PHYSICAL EXAMINATION:  GENERAL:  83 y.o.-year-old obese patient lying in the bed with no acute distress.  EYES: Pupils equal, round, reactive to light and accommodation. No scleral icterus. Extraocular muscles intact.  HEENT: Head atraumatic, normocephalic. Oropharynx and nasopharynx clear.  NECK:  Supple, no jugular venous distention. No thyroid enlargement, no tenderness.  LUNGS: Normal breath sounds bilaterally, no wheezing, rales,rhonchi or crepitation. No use of accessory muscles of respiration.  CARDIOVASCULAR: S1, S2 normal. No murmurs, rubs, or gallops.  ABDOMEN:  Soft, nontender, nondistended. Bowel sounds present. No organomegaly or mass.  EXTREMITIES: No pedal edema, cyanosis, or clubbing.  NEUROLOGIC: Cranial nerves II through XII are intact. Muscle strength 4/5 in all extremities. Sensation intact. Gait not checked.  PSYCHIATRIC: The patient is alert and oriented x 3.  SKIN: No obvious rash, lesion, or ulcer.   Physical Exam LABORATORY PANEL:   CBC  Recent Labs Lab 06/17/16 1448  WBC 5.0  HGB 12.4  HCT 36.8  PLT 223   ------------------------------------------------------------------------------------------------------------------  Chemistries   Recent Labs Lab 06/17/16 1448  NA 138  K 4.0  CL 102  CO2 30  GLUCOSE 104*  BUN 25*  CREATININE 0.67  CALCIUM 9.0   ------------------------------------------------------------------------------------------------------------------  Cardiac Enzymes  Recent Labs Lab 06/17/16 1448  TROPONINI <0.03   ------------------------------------------------------------------------------------------------------------------  RADIOLOGY:  Ct Head Wo Contrast  Result Date: 06/17/2016 CLINICAL DATA:  Syncopal episode with slurred speech EXAM: CT HEAD WITHOUT CONTRAST TECHNIQUE: Contiguous axial images were obtained from the base of the skull through the vertex without intravenous contrast. COMPARISON:  09/28/2012 FINDINGS: Brain: No evidence of acute infarction, hemorrhage, hydrocephalus, extra-axial collection or mass lesion/mass effect. Mild atrophic changes are noted. Vascular: No hyperdense vessel or unexpected calcification. Skull: Normal. Negative for fracture or focal lesion. Sinuses/Orbits: No acute finding. Other: None. IMPRESSION: Mild atrophic changes without acute abnormality. Electronically Signed   By: Inez Catalina M.D.   On: 06/17/2016 15:25   Mr Brain Wo Contrast  Result Date: 06/18/2016 CLINICAL DATA:  83 year old female with slurred speech and syncope. Stage IV breast cancer  including osseous metastatic disease. Initial encounter. EXAM: MRI HEAD WITHOUT  CONTRAST TECHNIQUE: Multiplanar, multiecho pulse sequences of the brain and surrounding structures were obtained without intravenous contrast. COMPARISON:  Head CT without contrast 06/17/2016. Brain MRI 10/21/2010. FINDINGS: Brain: Cerebral volume remains normal for age. No restricted diffusion to suggest acute infarction. No midline shift, mass effect, evidence of mass lesion, ventriculomegaly, extra-axial collection or acute intracranial hemorrhage. Cervicomedullary junction and pituitary are within normal limits. Stable and essentially normal for age gray and white matter signal (possible small chronic left posterior hemisphere white matter lacune is unchanged since 2012. No cortical encephalomalacia or chronic cerebral blood products. No areas of cerebral edema. Vascular: Major intracranial vascular flow voids are preserved; see right jugular bulb findings below. Skull and upper cervical spine: New right occipital condyles bone metastasis since 2012 (series 2, image 8 and with associated abnormal diffusion such as series 101, image 22). This metastasis appears to involve the right jugular bulb which might be occluded (series 7, image 5). Chronically decreased bone marrow signal throughout the calvarium is noted but with new areas of sclerosis at the vertex on the right (series 2, image 7). Progressed degenerative ligamentous hypertrophy about the odontoid. There is probably also a C4 level bone metastasis (series 2, image 12). Sinuses/Orbits: Visualized paranasal sinuses and mastoids are stable and well pneumatized. Interval postoperative changes to the left globe. No orbit soft tissue abnormality identified. Other: Negative scalp soft tissues. IMPRESSION: 1. Right occipital condyle bone metastasis is new since 2012 with strong suspicion of metastatic tumor extension into and occluding the right IJ bulb. 2. Sclerotic right calvarium  bone metastases are new since 2012 but may be treated/quiescent. 3. Negative for age noncontrast MRI appearance of the brain parenchyma with no cerebral edema to suggest metastatic disease to the brain - but early/asymptomatic metastatic disease to the brain cannot be excluded in the absence of intravenous contrast. Electronically Signed   By: Genevie Ann M.D.   On: 06/18/2016 10:43   US Carotid Bilateral (at [facility] Only)  Result Date: 06/17/2016 CLINICAL DATA:  Stroke. EXAM: BILATERAL CAROTID DUPLEX ULTRASOUND TECHNIQUE: Pearline Cables scale imaging, color Doppler and duplex ultrasound were performed of bilateral carotid and vertebral arteries in the neck. COMPARISON:  None. FINDINGS: Criteria: Quantification of carotid stenosis is based on velocity parameters that correlate the residual internal carotid diameter with NASCET-based stenosis levels, using the diameter of the distal internal carotid lumen as the denominator for stenosis measurement. The following velocity measurements were obtained: RIGHT ICA:  83/15 cm/sec CCA:  [id number] cm/sec SYSTOLIC ICA/CCA RATIO:  1.0 DIASTOLIC ICA/CCA RATIO:  1.8 ECA:  101 cm/sec LEFT ICA:  76/17 cm/sec CCA:  [id number] cm/sec SYSTOLIC ICA/CCA RATIO:  0.9 DIASTOLIC ICA/CCA RATIO:  2.4 ECA:  103 cm/sec RIGHT CAROTID ARTERY: Significant calcified atherosclerotic change seen in the distal CCA, bulb, and at the origins of the ICA and ECA. RIGHT VERTEBRAL ARTERY:  Antegrade flow LEFT CAROTID ARTERY: Significant calcified atherosclerotic change in the distal CCA, bulb, and at the origins of the ICA and ECA. LEFT VERTEBRAL ARTERY:  Antegrade flow IMPRESSION: 1. There is a large amount of symmetric calcified atherosclerosis bilaterally in the distal CCAs, bulbs, and at the origins of the ICAs and the ECAs. However, no altered velocities are identified to suggest significant stenosis. Electronically Signed   By: Dorise Bullion III M.D   On: 06/17/2016 19:08    ASSESSMENT AND PLAN:   Active  Problems:   Slurred speech  [age] year old female patient with slurred speech, syncope:  MRI shows Boney lesions  due to mets, causing vascular compromize.  Pt is still symptomatic with PT. Advised SNF She have a wish" to go to [place] for SNF " as her 3 sons are there.  I told her, that may not be possible from hospital, and her son can arrange transferring her later from NH here to [employer]. continue aspirin, Plavix, beta blockers, statins.  2.metastaticbreast cancer;continue letrozole 3.CAD,s/p CABG- Cont home meds. 4.Depression; prozac 5.hyperlipidemia- statin.   All the records are reviewed and case discussed with Care Management/Social Workerr. Management plans discussed with the patient, family and they are in agreement.  CODE STATUS: DNR  TOTAL TIME TAKING CARE OF THIS PATIENT: 35 minutes.     POSSIBLE D/C IN 1-2 DAYS, DEPENDING ON CLINICAL CONDITION.   Jeanette White M.D on 06/18/2016   Between 7am to 6pm - Pager - [phone]  After 6pm go to [email or web address] - password [email or web address] [facility]  Office  [phone]  CC: Primary care physician; No PCP Per Patient  Note: This dictation was prepared with Dragon dictation along with smaller phrase technology. Any transcriptional errors that result from this process are unintentional.

## 2016-06-19 LAB — HEMOGLOBIN A1C
Hgb A1c MFr Bld: 5.8 % — ABNORMAL HIGH (ref 4.8–5.6)
Mean Plasma Glucose: 120 mg/dL

## 2016-06-19 NOTE — Care Management Note (Signed)
Case Management Note  Patient Details  Name: Kately Acerra MRN: [id number] Date of Birth: 1927-11-22  Subjective/Objective:     Ms Ruhe is a [facility] patient.                Action/Plan:   Expected Discharge Date:  06/19/16               Expected Discharge Plan:     In-House Referral:     Discharge planning Services     Post Acute Care Choice:    Choice offered to:     DME Arranged:    DME Agency:     HH Arranged:    HH Agency:     Status of Service:     If discussed at [employer], dates discussed:    Additional Comments:  Lucyann Romano A, RN 06/19/2016, 10:01 AM

## 2016-06-19 NOTE — Clinical Social Work Note (Signed)
Patient to dc to [facility] via non-emergent EMS today. [facility] is aware as is the patient and facility. Packet delivered and necessary documentation sent to the facility. CSW will con't to follow pending additional dc needs.  Santiago Bumpers, MSW, Latanya Presser [phone]

## 2016-06-19 NOTE — Discharge Instructions (Signed)
Follow with palliative care  As needed.

## 2016-06-19 NOTE — Clinical Social Work Note (Signed)
CSW contacted [facility] answering service to discuss SNF placement and is awaiting a return call. CSW following.  Huel Coventry [place], [place]

## 2016-06-19 NOTE — NC FL2 (Signed)
[place] LEVEL OF CARE SCREENING TOOL     IDENTIFICATION  Patient Name: Jeanette White Birthdate: 01-31-1928 Sex: female Admission Date (Current Location): 06/17/2016  [place] and [place] Number:  Engineering geologist and Address:  [facility], [address], [place], [place] [address]      Provider Number: [id number]  Attending Physician Name and Address:  Vaughan Basta, MD  Relative Name and Phone Number:       Current Level of Care: Hospital Recommended Level of Care: [place] Prior Approval Number:    Date Approved/Denied:   PASRR Number: [id number]  Discharge Plan: SNF    Current Diagnoses: Patient Active Problem List   Diagnosis Date Noted  . Slurred speech 06/17/2016  . Hypercalcemia 05/07/2016  . Pulmonary nodules/lesions, multiple 05/07/2016    Orientation RESPIRATION BLADDER Height & Weight     Self, Time, Situation, Place  Normal Continent Weight: 233 lb (105.7 kg) Height:  5\' 1"  (154.9 cm)  BEHAVIORAL SYMPTOMS/MOOD NEUROLOGICAL BOWEL NUTRITION STATUS      Continent    AMBULATORY STATUS COMMUNICATION OF NEEDS Skin   Extensive Assist Verbally Normal                       Personal Care Assistance Level of Assistance  Bathing, Feeding, Dressing Bathing Assistance: Limited assistance Feeding assistance: Independent Dressing Assistance: Limited assistance     Functional Limitations Info             SPECIAL CARE FACTORS FREQUENCY  PT (By licensed PT), OT (By licensed OT)     PT Frequency: Up to 5X per day, 5 days per week OT Frequency: Up to 5X per day, 5 days per week            Contractures Contractures Info: Present    Additional Factors Info  Code Status, Allergies Code Status Info: DNR Allergies Info: Ace Inhibitors, Sulfa Antibiotics           Current Medications (06/19/2016):  This is the current hospital active medication list Current  Facility-Administered Medications  Medication Dose Route Frequency Provider Last Rate Last Dose  . clopidogrel (PLAVIX) tablet 75 mg  75 mg Oral Daily Epifanio Lesches, MD   75 mg at 06/18/16 0907  . famotidine (PEPCID) tablet 20 mg  20 mg Oral Daily Epifanio Lesches, MD   20 mg at 06/18/16 0908  . FLUoxetine (PROZAC) capsule 40 mg  40 mg Oral Daily Epifanio Lesches, MD   40 mg at 06/18/16 0906  . furosemide (LASIX) tablet 10 mg  10 mg Oral Daily Epifanio Lesches, MD   10 mg at 06/18/16 0906  . ipratropium-albuterol (DUONEB) 0.5-2.5 (3) MG/3ML nebulizer solution 3 mL  3 mL Nebulization Q6H PRN Vaughan Basta, MD      . oxyCODONE-acetaminophen (PERCOCET/ROXICET) 5-325 MG per tablet 1 tablet  1 tablet Oral Q6H PRN Epifanio Lesches, MD      . pantoprazole (PROTONIX) EC tablet 40 mg  40 mg Oral Daily Epifanio Lesches, MD   40 mg at 06/18/16 1000  . polyethylene glycol (MIRALAX / GLYCOLAX) packet 17 g  17 g Oral Daily Epifanio Lesches, MD   17 g at 06/18/16 0906  . potassium chloride SA (K-DUR,KLOR-CON) CR tablet 10 mEq  10 mEq Oral Daily Epifanio Lesches, MD   10 mEq at 06/18/16 0907  . pravastatin (PRAVACHOL) tablet 80 mg  80 mg Oral QHS Epifanio Lesches, MD   80 mg at  06/18/16 2148  . pregabalin (LYRICA) capsule 150 mg  150 mg Oral QHS Epifanio Lesches, MD   150 mg at 06/18/16 2148     Discharge Medications: Please see discharge summary for a list of discharge medications.  Relevant Imaging Results:  Relevant Lab Results:   Additional Information SS# [id number]  Zettie Pho, LCSW

## 2016-06-19 NOTE — Progress Notes (Signed)
MD making rounds. Recived order to discharge to [facility]. IV removed. LCSW facilitating transfer to facility. Report called to Lenna Sciara, nurse at [facility]. No unanswered questions. EMS called for transport. Transported via EMS. Belongings sent with patient.

## 2016-06-19 NOTE — Discharge Summary (Signed)
[facility] NAME: Jeanette White    MR#:  [id number]  DATE OF BIRTH:  [birth year]  DATE OF ADMISSION:  06/17/2016 ADMITTING PHYSICIAN: Epifanio Lesches, MD  DATE OF DISCHARGE: 06/19/2016  PRIMARY CARE PHYSICIAN: Gareth Morgan, MD    ADMISSION DIAGNOSIS:  Stroke ([place]) [I63.9] Dysarthria [R47.1] Transient cerebral ischemia, unspecified type [G45.9]  DISCHARGE DIAGNOSIS:  Active Problems:   Slurred speech   CVA ruled out    Per MRI- metastatic tumor extension into and occluding the right IJ bulb.  SECONDARY DIAGNOSIS:   Past Medical History:  Diagnosis Date  . Anginal pain (HCC)    STABLE  . Arthritis   . Asthma    CHRONIC OBSTRUCTIVE  . B12 deficiency   . Complication of anesthesia    SLOW TO WAKE UP  . Coronary artery disease   . Depression    MAJOR  . Diabetes mellitus without complication ([place])   . Edema    HANDS/FEET  . GERD (gastroesophageal reflux disease)   . HOH (hard of hearing)   . Hypertension   . Myocardial infarction   . Peripheral vascular disease ([place])   . Polyneuropathy ([place])   . Renal anomaly    ARTERY STENOSIS  . Rhinitis, allergic    CHRONIC  . Shoulder disorder    ROTATOR CUFF TEAR  LEFT  . Sleep apnea   . Spinal disorder    STENOSIS    HOSPITAL COURSE:   83 year old female patient with slurred speech, syncope:  MRI shows Boney lesions due to mets, causing vascular compromize. metastatic tumor extension into and occluding the right IJ bulb.  Pt is still symptomatic with PT. Advised SNF She have a wish" to go to [place] for SNF " as her 3 sons are there.  I told her, that may not be possible from hospital, and her son can arrange transferring her later from NH here to [place]. continue aspirin, Plavix, beta blockers, statins.    I spoke to her PMD from [facility]- and she helped arranging rehab here.   As now Mets involving skull and vasculature, prognosis is extremely poor.   As  per PMD- she was already not a great candidate for any aggressive treatment for her cancer, so she will benefit from conservative and palliative approach. Pt agreed and accepted this plan.   [facility] physician will take care of further needs a rehab. 2.metastaticbreast cancer;continue letrozole 3.CAD,s/p CABG- Cont home meds. 4.Depression; prozac 5.hyperlipidemia- statin.  DISCHARGE CONDITIONS:   Stable.  CONSULTS OBTAINED:    DRUG ALLERGIES:   Allergies  Allergen Reactions  . Ace Inhibitors   . Sulfa Antibiotics     DISCHARGE MEDICATIONS:   Current Discharge Medication List    CONTINUE these medications which have NOT CHANGED   Details  clopidogrel (PLAVIX) 75 MG tablet Take 75 mg by mouth daily.    ergocalciferol (VITAMIN D2) 50000 units capsule Take 50,000 Units by mouth once a week.    FLUoxetine (PROZAC) 40 MG capsule Take 40 mg by mouth daily.    furosemide (LASIX) 20 MG tablet Take 20 mg by mouth daily.     ipratropium-albuterol (DUONEB) 0.5-2.5 (3) MG/3ML SOLN Take 3 mLs by nebulization every 6 (six) hours. Qty: 360 mL, Refills: 0    letrozole (FEMARA) 2.5 MG tablet Take 2.5 mg by mouth daily.    nitroGLYCERIN (NITROSTAT) 0.4 MG SL tablet Place 0.4 mg under the tongue every 5 (five) minutes  as needed for chest pain.    nystatin (MYCOSTATIN/NYSTOP) powder Apply topically 3 (three) times daily. Qty: 15 g, Refills: 0    pantoprazole (PROTONIX) 40 MG tablet Take 40 mg by mouth daily.     polyethylene glycol (MIRALAX / GLYCOLAX) packet Take 17 g by mouth daily as needed. Qty: 14 each, Refills: 0    potassium chloride (K-DUR) 10 MEQ tablet Take 10-20 mEq by mouth 2 (two) times daily. 2 tablets in the morning and 1 tablet at bedtime    pravastatin (PRAVACHOL) 80 MG tablet Take 80 mg by mouth at bedtime.     pregabalin (LYRICA) 150 MG capsule Take 150 mg by mouth at bedtime.    ranitidine (ZANTAC) 150 MG tablet Take 150 mg by mouth daily.     valsartan-hydrochlorothiazide (DIOVAN-HCT) 80-12.5 MG tablet Take 1 tablet by mouth daily.    oxyCODONE-acetaminophen (PERCOCET/ROXICET) 5-325 MG tablet Take 1 tablet by mouth every 6 (six) hours as needed for moderate pain or severe pain. Qty: 30 tablet, Refills: 0    potassium chloride (K-DUR,KLOR-CON) 10 MEQ tablet Take 1 tablet (10 mEq total) by mouth daily. 2 AM 1 HS Qty: 30 tablet, Refills: 0         DISCHARGE INSTRUCTIONS:    Follow with PMD as needed.  If you experience worsening of your admission symptoms, develop shortness of breath, life threatening emergency, suicidal or homicidal thoughts you must seek medical attention immediately by calling 911 or calling your MD immediately  if symptoms less severe.  You Must read complete instructions/literature along with all the possible adverse reactions/side effects for all the Medicines you take and that have been prescribed to you. Take any new Medicines after you have completely understood and accept all the possible adverse reactions/side effects.   Please note  You were cared for by a hospitalist during your hospital stay. If you have any questions about your discharge medications or the care you received while you were in the hospital after you are discharged, you can call the unit and asked to speak with the hospitalist on call if the hospitalist that took care of you is not available. Once you are discharged, your primary care physician will handle any further medical issues. Please note that NO REFILLS for any discharge medications will be authorized once you are discharged, as it is imperative that you return to your primary care physician (or establish a relationship with a primary care physician if you do not have one) for your aftercare needs so that they can reassess your need for medications and monitor your lab values.    Today   CHIEF COMPLAINT:   Chief Complaint  Patient presents with  . Loss of Consciousness     HISTORY OF PRESENT ILLNESS:  Jeanette White  is a 83 y.o. female with a known history of Essential hypertension, coronary artery disease, it is post CABG before, stage IV breast cancer [facility] because of slurred speech, syncope. Noted to have slurred speech and facial droop by [facility] Physician,   She  is wheelchair-bound at baseline, no weakness or numbness in hands or legs.  VITAL SIGNS:  Blood pressure (!) 146/50, pulse (!) 57, temperature 98.6 F (37 C), temperature source Oral, resp. rate 20, height 5\' 1"  (1.549 m), weight 105.7 kg (233 lb), SpO2 94 %.  I/O:   Intake/Output Summary (Last 24 hours) at 06/19/16 0915 Last data filed at 06/18/16 1904  Gross per 24 hour  Intake              240 ml  Output                0 ml  Net              240 ml    PHYSICAL EXAMINATION:   GENERAL:  83 y.o.-year-old obese patient lying in the bed with no acute distress.  EYES: Pupils equal, round, reactive to light and accommodation. No scleral icterus. Extraocular muscles intact.  HEENT: Head atraumatic, normocephalic. Oropharynx and nasopharynx clear.  NECK:  Supple, no jugular venous distention. No thyroid enlargement, no tenderness.  LUNGS: Normal breath sounds bilaterally, no wheezing, rales,rhonchi or crepitation. No use of accessory muscles of respiration.  CARDIOVASCULAR: S1, S2 normal. No murmurs, rubs, or gallops.  ABDOMEN: Soft, nontender, nondistended. Bowel sounds present. No organomegaly or mass.  EXTREMITIES: No pedal edema, cyanosis, or clubbing.  NEUROLOGIC: Cranial nerves II through XII are intact. Muscle strength 4/5 in all extremities. Sensation intact. Gait not checked.  PSYCHIATRIC: The patient is alert and oriented x 3.  SKIN: No obvious rash, lesion, or ulcer.   DATA REVIEW:   CBC  Recent Labs Lab 06/17/16 1448  WBC 5.0  HGB 12.4  HCT 36.8  PLT 223    Chemistries   Recent Labs Lab 06/17/16 1448  NA 138  K 4.0  CL 102  CO2  30  GLUCOSE 104*  BUN 25*  CREATININE 0.67  CALCIUM 9.0    Cardiac Enzymes  Recent Labs Lab 06/17/16 1448  TROPONINI <0.03    Microbiology Results  Results for orders placed or performed during the hospital encounter of 06/17/16  MRSA PCR Screening     Status: None   Collection Time: 06/17/16  9:43 PM  Result Value Ref Range Status   MRSA by PCR NEGATIVE NEGATIVE Final    Comment:        The GeneXpert MRSA Assay (FDA approved for NASAL specimens only), is one component of a comprehensive MRSA colonization surveillance program. It is not intended to diagnose MRSA infection nor to guide or monitor treatment for MRSA infections.     RADIOLOGY:  Ct Head Wo Contrast  Result Date: 06/17/2016 CLINICAL DATA:  Syncopal episode with slurred speech EXAM: CT HEAD WITHOUT CONTRAST TECHNIQUE: Contiguous axial images were obtained from the base of the skull through the vertex without intravenous contrast. COMPARISON:  09/28/2012 FINDINGS: Brain: No evidence of acute infarction, hemorrhage, hydrocephalus, extra-axial collection or mass lesion/mass effect. Mild atrophic changes are noted. Vascular: No hyperdense vessel or unexpected calcification. Skull: Normal. Negative for fracture or focal lesion. Sinuses/Orbits: No acute finding. Other: None. IMPRESSION: Mild atrophic changes without acute abnormality. Electronically Signed   By: Inez Catalina M.D.   On: 06/17/2016 15:25   Mr Brain Wo Contrast  Result Date: 06/18/2016 CLINICAL DATA:  83 year old female with slurred speech and syncope. Stage IV breast cancer including osseous metastatic disease. Initial encounter. EXAM: MRI HEAD WITHOUT CONTRAST TECHNIQUE: Multiplanar, multiecho pulse sequences of the brain and surrounding structures were obtained without intravenous contrast. COMPARISON:  Head CT without contrast 06/17/2016. Brain MRI 10/21/2010. FINDINGS: Brain: Cerebral volume remains normal for age. No restricted diffusion to suggest  acute infarction. No midline shift, mass effect, evidence of mass lesion, ventriculomegaly, extra-axial collection or acute intracranial hemorrhage. Cervicomedullary junction and pituitary are within normal limits. Stable and essentially normal for age gray and white matter signal (possible small chronic left posterior hemisphere white matter  lacune is unchanged since 2012. No cortical encephalomalacia or chronic cerebral blood products. No areas of cerebral edema. Vascular: Major intracranial vascular flow voids are preserved; see right jugular bulb findings below. Skull and upper cervical spine: New right occipital condyles bone metastasis since 2012 (series 2, image 8 and with associated abnormal diffusion such as series 101, image 22). This metastasis appears to involve the right jugular bulb which might be occluded (series 7, image 5). Chronically decreased bone marrow signal throughout the calvarium is noted but with new areas of sclerosis at the vertex on the right (series 2, image 7). Progressed degenerative ligamentous hypertrophy about the odontoid. There is probably also a C4 level bone metastasis (series 2, image 12). Sinuses/Orbits: Visualized paranasal sinuses and mastoids are stable and well pneumatized. Interval postoperative changes to the left globe. No orbit soft tissue abnormality identified. Other: Negative scalp soft tissues. IMPRESSION: 1. Right occipital condyle bone metastasis is new since 2012 with strong suspicion of metastatic tumor extension into and occluding the right IJ bulb. 2. Sclerotic right calvarium bone metastases are new since 2012 but may be treated/quiescent. 3. Negative for age noncontrast MRI appearance of the brain parenchyma with no cerebral edema to suggest metastatic disease to the brain - but early/asymptomatic metastatic disease to the brain cannot be excluded in the absence of intravenous contrast. Electronically Signed   By: Genevie Ann M.D.   On: 06/18/2016 10:43    US Carotid Bilateral (at [facility] Only)  Result Date: 06/17/2016 CLINICAL DATA:  Stroke. EXAM: BILATERAL CAROTID DUPLEX ULTRASOUND TECHNIQUE: Pearline Cables scale imaging, color Doppler and duplex ultrasound were performed of bilateral carotid and vertebral arteries in the neck. COMPARISON:  None. FINDINGS: Criteria: Quantification of carotid stenosis is based on velocity parameters that correlate the residual internal carotid diameter with NASCET-based stenosis levels, using the diameter of the distal internal carotid lumen as the denominator for stenosis measurement. The following velocity measurements were obtained: RIGHT ICA:  83/15 cm/sec CCA:  [id number] cm/sec SYSTOLIC ICA/CCA RATIO:  1.0 DIASTOLIC ICA/CCA RATIO:  1.8 ECA:  101 cm/sec LEFT ICA:  76/17 cm/sec CCA:  [id number] cm/sec SYSTOLIC ICA/CCA RATIO:  0.9 DIASTOLIC ICA/CCA RATIO:  2.4 ECA:  103 cm/sec RIGHT CAROTID ARTERY: Significant calcified atherosclerotic change seen in the distal CCA, bulb, and at the origins of the ICA and ECA. RIGHT VERTEBRAL ARTERY:  Antegrade flow LEFT CAROTID ARTERY: Significant calcified atherosclerotic change in the distal CCA, bulb, and at the origins of the ICA and ECA. LEFT VERTEBRAL ARTERY:  Antegrade flow IMPRESSION: 1. There is a large amount of symmetric calcified atherosclerosis bilaterally in the distal CCAs, bulbs, and at the origins of the ICAs and the ECAs. However, no altered velocities are identified to suggest significant stenosis. Electronically Signed   By: Dorise Bullion III M.D   On: 06/17/2016 19:08    EKG:   Orders placed or performed during the hospital encounter of 06/17/16  . EKG 12-Lead  . EKG 12-Lead      Management plans discussed with the patient, family and they are in agreement.  CODE STATUS:     Code Status Orders        Start     Ordered   06/18/16 1413  Do not attempt resuscitation (DNR)  Continuous    Question Answer Comment  In the event of cardiac or respiratory ARREST Do not  call a "code blue"   In the event of cardiac or respiratory ARREST Do not perform Intubation, CPR,  defibrillation or ACLS   In the event of cardiac or respiratory ARREST Use medication by any route, position, wound care, and other measures to relive pain and suffering. May use oxygen, suction and manual treatment of airway obstruction as needed for comfort.   Comments pt confirmed with me.      06/18/16 1413    Code Status History    Date Active Date Inactive Code Status Order ID Comments User Context   05/07/2016  6:54 AM 05/11/2016  4:14 PM DNR [id number]  Saundra Shelling, MD Inpatient      TOTAL TIME TAKING CARE OF THIS PATIENT: 35 minutes.    Vaughan Basta M.D on 06/19/2016 at 9:15 AM  Between 7am to 6pm - Pager - [phone]  After 6pm go to [email or web address] - password [email or web address] [facility]  Office  [phone]  CC: Primary care physician; Gareth Morgan, MD   Note: This dictation was prepared with Dragon dictation along with smaller phrase technology. Any transcriptional errors that result from this process are unintentional.

## 2016-06-19 NOTE — Clinical Social Work Placement (Signed)
   CLINICAL SOCIAL WORK PLACEMENT  NOTE  Date:  06/19/2016  Patient Details  Name: Jeanette White MRN: [id number] Date of Birth: 11/09/1927  Clinical Social Work is seeking post-discharge placement for this patient at the [place] level of care (*CSW will initial, date and re-position this form in  chart as items are completed):  Yes   Patient/family provided with [facility] Work Department's list of facilities offering this level of care within the geographic area requested by the patient (or if unable, by the patient's family).  Yes   Patient/family informed of their freedom to choose among providers that offer the needed level of care, that participate in Medicare, Medicaid or managed care program needed by the patient, have an available bed and are willing to accept the patient.  Yes   Patient/family informed of [facility]'s ownership interest in [facility] and [facility], as well as of the fact that they are under no obligation to receive care at these facilities.  PASRR submitted to EDS on       PASRR number received on       Existing PASRR number confirmed on 06/19/16     FL2 transmitted to all facilities in geographic area requested by pt/family on 06/19/16     FL2 transmitted to all facilities within larger geographic area on       Patient informed that his/her managed care company has contracts with or will negotiate with certain facilities, including the following:   ([facility], [facility])     Yes   Patient/family informed of bed offers received.  Patient chooses bed at  [facility])     Physician recommends and patient chooses bed at  [facility])    Patient to be transferred to  [facility]) on 06/19/16.  Patient to be transferred to facility by  (Non emergent EMS)     Patient family notified on 06/19/16 of transfer.  Name of family member notified:  [facility] RN Tiffany     PHYSICIAN       Additional  Comment:    _______________________________________________ Zettie Pho, LCSW 06/19/2016, 10:30 AM

## 2016-08-02 ENCOUNTER — Emergency Department
Admission: EM | Admit: 2016-08-02 | Discharge: 2016-08-02 | Disposition: A | Discharge status: Home / Self Care | Payer: Medicare (Managed Care) | Attending: Emergency Medicine | Admitting: Emergency Medicine

## 2016-08-02 ENCOUNTER — Encounter: Payer: Self-pay | Admitting: Emergency Medicine

## 2016-08-02 DIAGNOSIS — I252 Old myocardial infarction: Secondary | ICD-10-CM | POA: Insufficient documentation

## 2016-08-02 DIAGNOSIS — Z79899 Other long term (current) drug therapy: Secondary | ICD-10-CM | POA: Diagnosis not present

## 2016-08-02 DIAGNOSIS — E119 Type 2 diabetes mellitus without complications: Secondary | ICD-10-CM | POA: Insufficient documentation

## 2016-08-02 DIAGNOSIS — I2510 Atherosclerotic heart disease of native coronary artery without angina pectoris: Secondary | ICD-10-CM | POA: Insufficient documentation

## 2016-08-02 DIAGNOSIS — I10 Essential (primary) hypertension: Secondary | ICD-10-CM | POA: Diagnosis not present

## 2016-08-02 DIAGNOSIS — J45909 Unspecified asthma, uncomplicated: Secondary | ICD-10-CM | POA: Insufficient documentation

## 2016-08-02 DIAGNOSIS — R531 Weakness: Secondary | ICD-10-CM | POA: Insufficient documentation

## 2016-08-02 LAB — COMPREHENSIVE METABOLIC PANEL
ALBUMIN: 3.3 g/dL — AB (ref 3.5–5.0)
ALK PHOS: 114 U/L (ref 38–126)
ALT: 10 U/L — ABNORMAL LOW (ref 14–54)
ANION GAP: 7 (ref 5–15)
AST: 20 U/L (ref 15–41)
BUN: 17 mg/dL (ref 6–20)
CALCIUM: 9.1 mg/dL (ref 8.9–10.3)
CHLORIDE: 104 mmol/L (ref 101–111)
CO2: 30 mmol/L (ref 22–32)
Creatinine, Ser: 0.74 mg/dL (ref 0.44–1.00)
GFR calc Af Amer: >60 mL/min (ref >60)
GFR calc non Af Amer: >60 mL/min (ref >60)
GLUCOSE: 98 mg/dL (ref 65–99)
POTASSIUM: 4.1 mmol/L (ref 3.5–5.1)
SODIUM: 141 mmol/L (ref 135–145)
Total Bilirubin: 0.6 mg/dL (ref 0.3–1.2)
Total Protein: 6.5 g/dL (ref 6.5–8.1)

## 2016-08-02 LAB — URINALYSIS, COMPLETE (UACMP) WITH MICROSCOPIC
BACTERIA UA: NONE SEEN
BILIRUBIN URINE: NEGATIVE
GLUCOSE, UA: NEGATIVE mg/dL
HGB URINE DIPSTICK: NEGATIVE
Ketones, ur: NEGATIVE mg/dL
NITRITE: NEGATIVE
PROTEIN: NEGATIVE mg/dL
Specific Gravity, Urine: 1.017 (ref 1.005–1.030)
Squamous Epithelial / LPF: NONE SEEN
pH: 7 (ref 5.0–8.0)

## 2016-08-02 LAB — TROPONIN I: Troponin I: <0.03 ng/mL (ref <0.03)

## 2016-08-02 LAB — CBC WITH DIFFERENTIAL/PLATELET
BASOS PCT: 1 %
Basophils Absolute: 0.1 10*3/uL (ref 0–0.1)
EOS ABS: 0.1 10*3/uL (ref 0–0.7)
EOS PCT: 3 %
HCT: 32.8 % — ABNORMAL LOW (ref 35.0–47.0)
Hemoglobin: 11.2 g/dL — ABNORMAL LOW (ref 12.0–16.0)
Lymphocytes Relative: 16 %
Lymphs Abs: 0.8 10*3/uL — ABNORMAL LOW (ref 1.0–3.6)
MCH: 30.3 pg (ref 26.0–34.0)
MCHC: 34.1 g/dL (ref 32.0–36.0)
MCV: 88.8 fL (ref 80.0–100.0)
MONOS PCT: 6 %
Monocytes Absolute: 0.3 10*3/uL (ref 0.2–0.9)
NEUTROS PCT: 74 %
Neutro Abs: 3.6 10*3/uL (ref 1.4–6.5)
PLATELETS: 260 10*3/uL (ref 150–440)
RBC: 3.69 MIL/uL — ABNORMAL LOW (ref 3.80–5.20)
RDW: 13.5 % (ref 11.5–14.5)
WBC: 4.9 10*3/uL (ref 3.6–11.0)

## 2016-08-02 MED ORDER — SODIUM CHLORIDE 0.9 % IV BOLUS (SEPSIS)
500.0000 mL | Freq: Once | INTRAVENOUS | Status: AC
  Administered 2016-08-02: 500 mL via INTRAVENOUS

## 2016-08-02 NOTE — ED Notes (Signed)
Pt discharged home after verbalizing understanding of discharge instructions; nad noted. 

## 2016-08-02 NOTE — ED Triage Notes (Signed)
Pt via ems from home with increasing weakness in her legs. She is mostly wheelchair bound, and is moved by [employer] lift. She was seen by home health nurse today, fell out of her hoyer lift, and they called. No LOC or head injury. No complaints at this time, other than leg weakness. Pt reports cancer "all over my body" that is "aggressive." She doesn't know what kind of cancer she has or what the treatment plan is. NAD noted.

## 2016-08-02 NOTE — ED Notes (Signed)
Pt visited by Ferman Hamming from [facility] senior care. She indicated that pt has metastatic breast cancer and that [facility]. She states that pt will be picked up by their Lucianne Lei driver when she is discharged.

## 2016-08-02 NOTE — ED Provider Notes (Signed)
[facility] Provider Note   CSN: [id number] Arrival date & time: 08/02/16  8299     History   Chief Complaint Chief Complaint  Patient presents with  . Weakness    HPI Jeanette White is a 83 y.o. female hx of metastatic breast cancer with mets to skull and throughout the body on hospice, DM, here with weakness. Progressive weakness over the last several months. Lives at home but is now wheelchair bound. She usually has home care to come and take care of her and move her with a hoyer lift. This morning, the hoyer lift malfunction and the aide was unable to move her back to the bed. Denies fall or trauma. Patient states that she was told that she has metastatic cancer and only has several months to live. She confirms that she is DNR and just wants comfort care.   The history is provided by the patient.    Past Medical History:  Diagnosis Date  . Anginal pain (HCC)    STABLE  . Arthritis   . Asthma    CHRONIC OBSTRUCTIVE  . B12 deficiency   . Complication of anesthesia    SLOW TO WAKE UP  . Coronary artery disease   . Depression    MAJOR  . Diabetes mellitus without complication ([place])   . Edema    HANDS/FEET  . GERD (gastroesophageal reflux disease)   . HOH (hard of hearing)   . Hypertension   . Myocardial infarction   . Peripheral vascular disease ([place])   . Polyneuropathy ([place])   . Renal anomaly    ARTERY STENOSIS  . Rhinitis, allergic    CHRONIC  . Shoulder disorder    ROTATOR CUFF TEAR  LEFT  . Sleep apnea   . Spinal disorder    STENOSIS    Patient Active Problem List   Diagnosis Date Noted  . Slurred speech 06/17/2016  . Hypercalcemia 05/07/2016  . Pulmonary nodules/lesions, multiple 05/07/2016    Past Surgical History:  Procedure Laterality Date  . APPENDECTOMY    . CATARACT EXTRACTION W/PHACO Left 12/09/2015   Procedure: CATARACT EXTRACTION PHACO AND INTRAOCULAR LENS PLACEMENT (IOC);  Surgeon: Estill Cotta, MD;  Location: [facility]  ORS;  Service: Ophthalmology;  Laterality: Left;  [place] 01:21 AP% 22.8 CDE 31.49 fluid pack lot # [id number]  . CHOLECYSTECTOMY    . CORONARY ANGIOPLASTY     STENT  . CORONARY ARTERY BYPASS GRAFT    . TOTAL KNEE ARTHROPLASTY Bilateral     OB History    No data available       Home Medications    Prior to Admission medications   Medication Sig Start Date End Date Taking? Authorizing Provider  clopidogrel (PLAVIX) 75 MG tablet Take 75 mg by mouth daily.   Yes Historical Provider, MD  FLUoxetine (PROZAC) 40 MG capsule Take 40 mg by mouth daily.   Yes Historical Provider, MD  furosemide (LASIX) 20 MG tablet Take 20 mg by mouth daily.    Yes Historical Provider, MD  ipratropium-albuterol (DUONEB) 0.5-2.5 (3) MG/3ML SOLN Take 3 mLs by nebulization every 6 (six) hours. 05/11/16  Yes Loletha Grayer, MD  letrozole [facility]) 2.5 MG tablet Take 2.5 mg by mouth daily.   Yes Historical Provider, MD  nitroGLYCERIN (NITROSTAT) 0.4 MG SL tablet Place 0.4 mg under the tongue every 5 (five) minutes as needed for chest pain.   Yes Historical Provider, MD  nystatin (MYCOSTATIN/NYSTOP) powder Apply topically 3 (three) times daily. 05/11/16  Yes  Loletha Grayer, MD  pantoprazole (PROTONIX) 40 MG tablet Take 40 mg by mouth daily.    Yes Historical Provider, MD  polyethylene glycol (MIRALAX / GLYCOLAX) packet Take 17 g by mouth daily as needed. 05/11/16  Yes Loletha Grayer, MD  potassium chloride (K-DUR) 10 MEQ tablet Take 10-20 mEq by mouth 2 (two) times daily. 2 tablets in the morning and 1 tablet at bedtime   Yes Historical Provider, MD  pravastatin (PRAVACHOL) 80 MG tablet Take 80 mg by mouth at bedtime.    Yes Historical Provider, MD  pregabalin (LYRICA) 150 MG capsule Take 150 mg by mouth at bedtime.   Yes Historical Provider, MD  ranitidine (ZANTAC) 150 MG tablet Take 150 mg by mouth daily.   Yes Historical Provider, MD  valsartan-hydrochlorothiazide (DIOVAN-HCT) 80-12.5 MG tablet Take 1 tablet by mouth  daily.   Yes Historical Provider, MD  ergocalciferol (VITAMIN D2) 50000 units capsule Take 50,000 Units by mouth once a week.    Historical Provider, MD  oxyCODONE-acetaminophen (PERCOCET/ROXICET) 5-325 MG tablet Take 1 tablet by mouth every 6 (six) hours as needed for moderate pain or severe pain. Patient not taking: Reported on 06/17/2016 05/11/16   Loletha Grayer, MD  potassium chloride (K-DUR,KLOR-CON) 10 MEQ tablet Take 1 tablet (10 mEq total) by mouth daily. 2 AM 1 HS Patient not taking: Reported on 06/17/2016 05/11/16   Loletha Grayer, MD    Family History Family History  Problem Relation Age of Onset  . Alzheimer's disease Sister   . Arthritis Sister     Social History Social History  Substance Use Topics  . Smoking status: Never Smoker  . Smokeless tobacco: Never Used  . Alcohol use No     Allergies   Ace inhibitors and Sulfa antibiotics   Review of Systems Review of Systems  Neurological: Positive for weakness.  All other systems reviewed and are negative.    Physical Exam Updated Vital Signs BP (!) 152/92 (BP Location: Right Arm)   Pulse 62   Temp 98 F (36.7 C) (Oral)   Resp (!) 22   Ht 5\' 1"  (1.549 m)   Wt 233 lb (105.7 kg)   SpO2 98%   BMI 44.02 kg/m   Physical Exam  Constitutional: She is oriented to person, place, and time.  Chronically ill   HENT:  Head: Normocephalic.  Eyes: EOM are normal. Pupils are equal, round, and reactive to light.  Neck: Normal range of motion. Neck supple.  Cardiovascular: Normal rate, regular rhythm and normal heart sounds.   Pulmonary/Chest: Effort normal and breath sounds normal. No respiratory distress. She has no wheezes. She has no rales.  Abdominal: Soft. Bowel sounds are normal. She exhibits no distension. There is no tenderness. There is no guarding.  Musculoskeletal: Normal range of motion.  Neurological: She is alert and oriented to person, place, and time.  CN 2-12 intact. Strength 4/5 throughout.     Skin: Skin is warm.  Psychiatric: She has a normal mood and affect.  Nursing note and vitals reviewed.    ED Treatments / Results  Labs (all labs ordered are listed, but only abnormal results are displayed) Labs Reviewed  CBC WITH DIFFERENTIAL/PLATELET - Abnormal; Notable for the following:       Result Value   RBC 3.69 (*)    Hemoglobin 11.2 (*)    HCT 32.8 (*)    Lymphs Abs 0.8 (*)    All other components within normal limits  COMPREHENSIVE METABOLIC PANEL - Abnormal; Notable  for the following:    Albumin 3.3 (*)    ALT 10 (*)    All other components within normal limits  URINALYSIS, COMPLETE (UACMP) WITH MICROSCOPIC - Abnormal; Notable for the following:    Color, Urine YELLOW (*)    APPearance HAZY (*)    Leukocytes, UA TRACE (*)    All other components within normal limits  URINE CULTURE  TROPONIN I    EKG  EKG Interpretation None       ED ECG REPORT I, Wandra Arthurs, the attending physician, personally viewed and interpreted this ECG.   Date: 08/02/2016  EKG Time: 9:28 am  Rate: 61  Rhythm: normal EKG, normal sinus rhythm  Axis: normal  Intervals:none  ST&T Change: nonspecific    Radiology No results found.  Procedures Procedures (including critical care time)  Medications Ordered in ED Medications  sodium chloride 0.9 % bolus 500 mL (500 mLs Intravenous New Bag/Given 08/02/16 1018)     Initial Impression / Assessment and Plan / ED Course  I have reviewed the triage vital signs and the nursing notes.  Pertinent labs & imaging results that were available during my care of the patient were reviewed by me and considered in my medical decision making (see chart for details).     Jeanette White is a 83 y.o. female here with weakness. Progressive weakness for several months and now is wheelchair bound. Review of her chart showed metastatic breast cancer and recently had mets to skull and internal carotid occlusion. I talked to [facility] and patient.  They both confirm that she is on hospice and doesn't wish to get more treatment for the cancer. Will check labs, UA to identify reversible causes. Initially ordered CT head but since patient doesn't want further treatment even if cancer progresses, will hold off for now.   12:15 PM Labs unremarkable. UA similar to previous and has no symptoms so urine culture added. Talked to [facility]. They are comfortable taking patient back. She has PT/OT and aide at home. Has poor prognosis from the cancer and patient just wants to be comfortable.    Final Clinical Impressions(s) / ED Diagnoses   Final diagnoses:  None    New Prescriptions New Prescriptions   No medications on file     Drenda Freeze, MD 08/02/16 1216

## 2016-08-02 NOTE — Discharge Instructions (Signed)
Continue your current meds.   You have metastatic cancer and have poor prognosis.   Continue to follow up with your doctor  Return to ER if you have worse weakness, trouble breathing, trouble speaking, vomiting, fevers.

## 2016-08-05 LAB — URINE CULTURE: Culture: >=100000 — AB

## 2016-09-21 ENCOUNTER — Encounter: Payer: Self-pay | Admitting: Emergency Medicine

## 2016-09-21 ENCOUNTER — Observation Stay
Admission: EM | Admit: 2016-09-21 | Discharge: 2016-09-22 | Disposition: A | Discharge status: Home / Self Care | Payer: Medicare (Managed Care) | Attending: Internal Medicine | Admitting: Internal Medicine

## 2016-09-21 DIAGNOSIS — Z79899 Other long term (current) drug therapy: Secondary | ICD-10-CM | POA: Insufficient documentation

## 2016-09-21 DIAGNOSIS — I252 Old myocardial infarction: Secondary | ICD-10-CM | POA: Insufficient documentation

## 2016-09-21 DIAGNOSIS — E860 Dehydration: Secondary | ICD-10-CM | POA: Diagnosis not present

## 2016-09-21 DIAGNOSIS — Z791 Long term (current) use of non-steroidal anti-inflammatories (NSAID): Secondary | ICD-10-CM | POA: Diagnosis not present

## 2016-09-21 DIAGNOSIS — I2510 Atherosclerotic heart disease of native coronary artery without angina pectoris: Secondary | ICD-10-CM | POA: Insufficient documentation

## 2016-09-21 DIAGNOSIS — K219 Gastro-esophageal reflux disease without esophagitis: Secondary | ICD-10-CM | POA: Diagnosis not present

## 2016-09-21 DIAGNOSIS — J45909 Unspecified asthma, uncomplicated: Secondary | ICD-10-CM | POA: Insufficient documentation

## 2016-09-21 DIAGNOSIS — E1151 Type 2 diabetes mellitus with diabetic peripheral angiopathy without gangrene: Secondary | ICD-10-CM | POA: Diagnosis not present

## 2016-09-21 DIAGNOSIS — I10 Essential (primary) hypertension: Secondary | ICD-10-CM | POA: Insufficient documentation

## 2016-09-21 DIAGNOSIS — Z7902 Long term (current) use of antithrombotics/antiplatelets: Secondary | ICD-10-CM | POA: Insufficient documentation

## 2016-09-21 DIAGNOSIS — Z951 Presence of aortocoronary bypass graft: Secondary | ICD-10-CM | POA: Diagnosis not present

## 2016-09-21 DIAGNOSIS — Z853 Personal history of malignant neoplasm of breast: Secondary | ICD-10-CM | POA: Insufficient documentation

## 2016-09-21 DIAGNOSIS — R531 Weakness: Secondary | ICD-10-CM

## 2016-09-21 DIAGNOSIS — R110 Nausea: Secondary | ICD-10-CM | POA: Insufficient documentation

## 2016-09-21 DIAGNOSIS — R197 Diarrhea, unspecified: Secondary | ICD-10-CM | POA: Insufficient documentation

## 2016-09-21 DIAGNOSIS — G4730 Sleep apnea, unspecified: Secondary | ICD-10-CM | POA: Diagnosis not present

## 2016-09-21 DIAGNOSIS — I959 Hypotension, unspecified: Secondary | ICD-10-CM | POA: Diagnosis not present

## 2016-09-21 DIAGNOSIS — Z7951 Long term (current) use of inhaled steroids: Secondary | ICD-10-CM | POA: Diagnosis not present

## 2016-09-21 DIAGNOSIS — F329 Major depressive disorder, single episode, unspecified: Secondary | ICD-10-CM | POA: Insufficient documentation

## 2016-09-21 DIAGNOSIS — E1142 Type 2 diabetes mellitus with diabetic polyneuropathy: Secondary | ICD-10-CM | POA: Diagnosis not present

## 2016-09-21 DIAGNOSIS — Z79811 Long term (current) use of aromatase inhibitors: Secondary | ICD-10-CM | POA: Insufficient documentation

## 2016-09-21 DIAGNOSIS — N390 Urinary tract infection, site not specified: Principal | ICD-10-CM | POA: Insufficient documentation

## 2016-09-21 LAB — URINALYSIS, COMPLETE (UACMP) WITH MICROSCOPIC
BACTERIA UA: NONE SEEN
Bilirubin Urine: NEGATIVE
Glucose, UA: NEGATIVE mg/dL
Hgb urine dipstick: NEGATIVE
Ketones, ur: NEGATIVE mg/dL
Nitrite: POSITIVE — AB
PROTEIN: 30 mg/dL — AB
Specific Gravity, Urine: 1.017 (ref 1.005–1.030)
pH: 8 (ref 5.0–8.0)

## 2016-09-21 LAB — COMPREHENSIVE METABOLIC PANEL
ALBUMIN: 3.4 g/dL — AB (ref 3.5–5.0)
ALK PHOS: 194 U/L — AB (ref 38–126)
ALT: 30 U/L (ref 14–54)
ANION GAP: 8 (ref 5–15)
AST: 99 U/L — ABNORMAL HIGH (ref 15–41)
BUN: 18 mg/dL (ref 6–20)
CALCIUM: 8.5 mg/dL — AB (ref 8.9–10.3)
CO2: 28 mmol/L (ref 22–32)
Chloride: 103 mmol/L (ref 101–111)
Creatinine, Ser: 0.68 mg/dL (ref 0.44–1.00)
GFR calc Af Amer: >60 mL/min (ref >60)
GFR calc non Af Amer: >60 mL/min (ref >60)
GLUCOSE: 104 mg/dL — AB (ref 65–99)
Potassium: 4.3 mmol/L (ref 3.5–5.1)
Sodium: 139 mmol/L (ref 135–145)
Total Bilirubin: 1.2 mg/dL (ref 0.3–1.2)
Total Protein: 6.6 g/dL (ref 6.5–8.1)

## 2016-09-21 LAB — CBC
HEMATOCRIT: 35.2 % (ref 35.0–47.0)
HEMOGLOBIN: 11.9 g/dL — AB (ref 12.0–16.0)
MCH: 29.7 pg (ref 26.0–34.0)
MCHC: 33.8 g/dL (ref 32.0–36.0)
MCV: 87.8 fL (ref 80.0–100.0)
Platelets: 180 10*3/uL (ref 150–440)
RBC: 4 MIL/uL (ref 3.80–5.20)
RDW: 13.7 % (ref 11.5–14.5)
WBC: 5.7 10*3/uL (ref 3.6–11.0)

## 2016-09-21 LAB — LIPASE, BLOOD: Lipase: 30 U/L (ref 11–51)

## 2016-09-21 MED ORDER — NYSTATIN 100000 UNIT/GM EX POWD
Freq: Three times a day (TID) | CUTANEOUS | Status: DC
  Administered 2016-09-21: 23:00:00 via TOPICAL
  Filled 2016-09-21: qty 15

## 2016-09-21 MED ORDER — ONDANSETRON HCL 4 MG/2ML IJ SOLN
4.0000 mg | Freq: Once | INTRAMUSCULAR | Status: AC | PRN
  Administered 2016-09-21: 4 mg via INTRAVENOUS
  Filled 2016-09-21: qty 2

## 2016-09-21 MED ORDER — CEFTRIAXONE SODIUM IN DEXTROSE 20 MG/ML IV SOLN
1.0000 g | Freq: Once | INTRAVENOUS | Status: AC
  Administered 2016-09-21: 1 g via INTRAVENOUS
  Filled 2016-09-21: qty 50

## 2016-09-21 NOTE — ED Triage Notes (Signed)
Patient from home via [facility]. EMS reports patient has had diarrhea x2 days with minor relief of symptoms. Patient reports abdominal pain and bloating as well as nausea. Denies vomiting. Unsure of fever at home. Patient is a [facility] patient and the [facility] nurse reports patient has had increased weakness and is uncomfortable to stay tonight by herself. Patient has hx of breast cancer. Patient A&O x4.

## 2016-09-22 DIAGNOSIS — R531 Weakness: Secondary | ICD-10-CM

## 2016-09-22 LAB — BASIC METABOLIC PANEL
ANION GAP: 5 (ref 5–15)
BUN: 16 mg/dL (ref 6–20)
CALCIUM: 8 mg/dL — AB (ref 8.9–10.3)
CO2: 30 mmol/L (ref 22–32)
Chloride: 105 mmol/L (ref 101–111)
Creatinine, Ser: 0.67 mg/dL (ref 0.44–1.00)
GLUCOSE: 95 mg/dL (ref 65–99)
POTASSIUM: 3.4 mmol/L — AB (ref 3.5–5.1)
Sodium: 140 mmol/L (ref 135–145)

## 2016-09-22 LAB — CBC
HEMATOCRIT: 31.1 % — AB (ref 35.0–47.0)
HEMOGLOBIN: 10.5 g/dL — AB (ref 12.0–16.0)
MCH: 29.6 pg (ref 26.0–34.0)
MCHC: 33.7 g/dL (ref 32.0–36.0)
MCV: 87.8 fL (ref 80.0–100.0)
Platelets: 162 10*3/uL (ref 150–440)
RBC: 3.54 MIL/uL — AB (ref 3.80–5.20)
RDW: 14.1 % (ref 11.5–14.5)
WBC: 3.4 10*3/uL — ABNORMAL LOW (ref 3.6–11.0)

## 2016-09-22 LAB — GLUCOSE, CAPILLARY: GLUCOSE-CAPILLARY: 86 mg/dL (ref 65–99)

## 2016-09-22 MED ORDER — ONDANSETRON HCL 4 MG PO TABS: 4.0000 mg | ORAL_TABLET | Freq: Four times a day (QID) | ORAL | Status: DC | PRN

## 2016-09-22 MED ORDER — MELOXICAM 7.5 MG PO TABS
7.5000 mg | ORAL_TABLET | Freq: Every day | ORAL | Status: DC
  Administered 2016-09-22: 10:00:00 7.5 mg via ORAL
  Filled 2016-09-22: qty 1

## 2016-09-22 MED ORDER — ENOXAPARIN SODIUM 40 MG/0.4ML ~~LOC~~ SOLN
40.0000 mg | SUBCUTANEOUS | Status: DC
  Administered 2016-09-22: 05:00:00 40 mg via SUBCUTANEOUS
  Filled 2016-09-22: qty 0.4

## 2016-09-22 MED ORDER — BISACODYL 5 MG PO TBEC: 5.0000 mg | DELAYED_RELEASE_TABLET | Freq: Every day | ORAL | Status: DC | PRN

## 2016-09-22 MED ORDER — FLUOXETINE HCL 20 MG PO CAPS
40.0000 mg | ORAL_CAPSULE | Freq: Every day | ORAL | Status: DC
  Administered 2016-09-22: 10:00:00 40 mg via ORAL
  Filled 2016-09-22: qty 2

## 2016-09-22 MED ORDER — ALBUTEROL SULFATE (2.5 MG/3ML) 0.083% IN NEBU: 2.5000 mg | INHALATION_SOLUTION | Freq: Four times a day (QID) | RESPIRATORY_TRACT | Status: DC | PRN

## 2016-09-22 MED ORDER — POTASSIUM CHLORIDE CRYS ER 20 MEQ PO TBCR
20.0000 meq | EXTENDED_RELEASE_TABLET | Freq: Every morning | ORAL | Status: DC
  Administered 2016-09-22: 20 meq via ORAL
  Filled 2016-09-22: qty 1

## 2016-09-22 MED ORDER — IRBESARTAN 75 MG PO TABS
75.0000 mg | ORAL_TABLET | Freq: Every day | ORAL | Status: DC
  Administered 2016-09-22: 75 mg via ORAL
  Filled 2016-09-22: qty 1

## 2016-09-22 MED ORDER — PANTOPRAZOLE SODIUM 40 MG PO TBEC
40.0000 mg | DELAYED_RELEASE_TABLET | Freq: Every day | ORAL | Status: DC
  Administered 2016-09-22: 10:00:00 40 mg via ORAL
  Filled 2016-09-22: qty 1

## 2016-09-22 MED ORDER — DEXTROSE 5 % IV SOLN
1.0000 g | INTRAVENOUS | Status: DC
  Filled 2016-09-22: qty 10

## 2016-09-22 MED ORDER — IPRATROPIUM BROMIDE 0.02 % IN SOLN
0.5000 mg | Freq: Four times a day (QID) | RESPIRATORY_TRACT | Status: DC | PRN
Start: 2016-09-22 — End: 2016-09-22

## 2016-09-22 MED ORDER — LETROZOLE 2.5 MG PO TABS
2.5000 mg | ORAL_TABLET | Freq: Every day | ORAL | Status: DC
  Administered 2016-09-22: 10:00:00 2.5 mg via ORAL
  Filled 2016-09-22: qty 1

## 2016-09-22 MED ORDER — LORAZEPAM 2 MG/ML PO CONC: 0.5000 mg | ORAL | Status: DC | PRN

## 2016-09-22 MED ORDER — LIDOCAINE 5 % EX PTCH
1.0000 | MEDICATED_PATCH | CUTANEOUS | Status: DC
  Administered 2016-09-22: 05:00:00 1 via TRANSDERMAL
  Filled 2016-09-22: qty 1

## 2016-09-22 MED ORDER — OXYCODONE HCL 5 MG PO TABS: 5.0000 mg | ORAL_TABLET | ORAL | Status: DC | PRN

## 2016-09-22 MED ORDER — FUROSEMIDE 20 MG PO TABS
20.0000 mg | ORAL_TABLET | Freq: Every day | ORAL | Status: DC
  Administered 2016-09-22: 10:00:00 20 mg via ORAL
  Filled 2016-09-22: qty 1

## 2016-09-22 MED ORDER — NYSTATIN 100000 UNIT/GM EX POWD
Freq: Three times a day (TID) | CUTANEOUS | Status: DC
  Administered 2016-09-22: 10:00:00 via TOPICAL
  Filled 2016-09-22: qty 15

## 2016-09-22 MED ORDER — FUROSEMIDE 20 MG PO TABS: 20.0000 mg | ORAL_TABLET | Freq: Every day | ORAL | 0 refills | Status: AC

## 2016-09-22 MED ORDER — BUSPIRONE HCL 5 MG PO TABS
5.0000 mg | ORAL_TABLET | Freq: Two times a day (BID) | ORAL | Status: DC
  Administered 2016-09-22: 10:00:00 5 mg via ORAL
  Filled 2016-09-22: qty 1

## 2016-09-22 MED ORDER — VALSARTAN-HYDROCHLOROTHIAZIDE 80-12.5 MG PO TABS: 1.0000 | ORAL_TABLET | Freq: Every day | ORAL | 0 refills | Status: DC

## 2016-09-22 MED ORDER — POTASSIUM CHLORIDE ER 10 MEQ PO TBCR: 10.0000 meq | EXTENDED_RELEASE_TABLET | Freq: Two times a day (BID) | ORAL | Status: DC

## 2016-09-22 MED ORDER — ATROPINE SULFATE 1 % OP SOLN
2.0000 [drp] | OPHTHALMIC | Status: DC | PRN
  Filled 2016-09-22: qty 2

## 2016-09-22 MED ORDER — SENNOSIDES-DOCUSATE SODIUM 8.6-50 MG PO TABS: 1.0000 | ORAL_TABLET | Freq: Every evening | ORAL | Status: DC | PRN

## 2016-09-22 MED ORDER — VALSARTAN-HYDROCHLOROTHIAZIDE 80-12.5 MG PO TABS: 1.0000 | ORAL_TABLET | Freq: Every day | ORAL | Status: DC

## 2016-09-22 MED ORDER — HYDROCHLOROTHIAZIDE 12.5 MG PO CAPS
12.5000 mg | ORAL_CAPSULE | Freq: Every day | ORAL | Status: DC
  Administered 2016-09-22: 12.5 mg via ORAL
  Filled 2016-09-22: qty 1

## 2016-09-22 MED ORDER — ACETAMINOPHEN 325 MG PO TABS: 650.0000 mg | ORAL_TABLET | Freq: Four times a day (QID) | ORAL | Status: DC | PRN

## 2016-09-22 MED ORDER — MORPHINE SULFATE 10 MG/5ML PO SOLN: 4.0000 mg | ORAL | Status: DC | PRN

## 2016-09-22 MED ORDER — CLOPIDOGREL BISULFATE 75 MG PO TABS
75.0000 mg | ORAL_TABLET | Freq: Every day | ORAL | Status: DC
  Administered 2016-09-22: 75 mg via ORAL
  Filled 2016-09-22: qty 1

## 2016-09-22 MED ORDER — SODIUM CHLORIDE 0.9 % IV SOLN
INTRAVENOUS | Status: DC
  Administered 2016-09-22: 02:00:00 via INTRAVENOUS

## 2016-09-22 MED ORDER — ONDANSETRON HCL 4 MG/2ML IJ SOLN: 4.0000 mg | Freq: Four times a day (QID) | INTRAMUSCULAR | Status: DC | PRN

## 2016-09-22 MED ORDER — FLUTICASONE PROPIONATE 50 MCG/ACT NA SUSP
1.0000 | Freq: Every day | NASAL | Status: DC
  Administered 2016-09-22: 10:00:00 1 via NASAL
  Filled 2016-09-22: qty 16

## 2016-09-22 MED ORDER — CEPHALEXIN 500 MG PO CAPS: 500.0000 mg | ORAL_CAPSULE | Freq: Two times a day (BID) | ORAL | 0 refills | Status: DC

## 2016-09-22 MED ORDER — CEPHALEXIN 500 MG PO CAPS
500.0000 mg | ORAL_CAPSULE | Freq: Two times a day (BID) | ORAL | Status: DC
  Administered 2016-09-22: 13:00:00 500 mg via ORAL
  Filled 2016-09-22: qty 1

## 2016-09-22 MED ORDER — MAGNESIUM CITRATE PO SOLN
1.0000 | Freq: Once | ORAL | Status: DC | PRN
  Filled 2016-09-22: qty 296

## 2016-09-22 MED ORDER — IPRATROPIUM-ALBUTEROL 0.5-2.5 (3) MG/3ML IN SOLN: 3.0000 mL | Freq: Four times a day (QID) | RESPIRATORY_TRACT | Status: DC | PRN

## 2016-09-22 MED ORDER — ACETAMINOPHEN 650 MG RE SUPP: 650.0000 mg | Freq: Four times a day (QID) | RECTAL | Status: DC | PRN

## 2016-09-22 MED ORDER — IPRATROPIUM-ALBUTEROL 0.5-2.5 (3) MG/3ML IN SOLN: 3.0000 mL | Freq: Four times a day (QID) | RESPIRATORY_TRACT | Status: DC

## 2016-09-22 MED ORDER — NITROGLYCERIN 0.4 MG SL SUBL: 0.4000 mg | SUBLINGUAL_TABLET | SUBLINGUAL | Status: DC | PRN

## 2016-09-22 MED ORDER — PREGABALIN 75 MG PO CAPS: 150.0000 mg | ORAL_CAPSULE | Freq: Every day | ORAL | Status: DC

## 2016-09-22 MED ORDER — POTASSIUM CHLORIDE CRYS ER 10 MEQ PO TBCR: 10.0000 meq | EXTENDED_RELEASE_TABLET | Freq: Every evening | ORAL | Status: DC

## 2016-09-22 NOTE — Evaluation (Signed)
Physical Therapy Evaluation Patient Details Name: Jeanette White MRN: [id number] DOB: [birth year] Today's Date: 09/22/2016   History of Present Illness   83 y.o. female with a known history of Metastatic breast cancer, asthma, coronary artery disease status post MI, diabetes, depression, GERD, sleep apnea presents to the emergency department for evaluation of weakness.  Patient was in a usual state of health until the past 2 days patient had been complaining of abdominal pain with nausea.  Pt is w/c bound at baseline.  Clinical Impression  Pt did relatively well with bed mobility and getting to recliner.  She is limited to w/c mobility at baseline and generally is able to transfer to/from it with little assistance.  She reports she is near her baseline, at least enough to be able to go home with the normal assistance levels she currently has.  Pt will benefit from continuing with the [facility] program (currently 4d/wk?)      Follow Up Recommendations  (return to current [facility] program )    Equipment Recommendations  None recommended by PT    Recommendations for Other Services       Precautions / Restrictions Precautions Precautions: Fall Restrictions Weight Bearing Restrictions: No      Mobility  Bed Mobility Overal bed mobility: Modified Independent             General bed mobility comments: Pt needed heavy use of bed rails to get to sitting, but did so w/o direct assist.  Pt needed a lot of effort to scoot herself to EOB to set up transfer to recliner  Transfers Overall transfer level: Needs assistance   Transfers: Squat Pivot Transfers     Squat pivot transfers: Min guard     General transfer comment: Pt was able to scoot from EOB to recliner with arm rest down and heavy UE use.  She reports she does not use a slide board and is not interested in it.  She did not need any direct assist though she reported there are times where he aides to have to help to safely get to/from  w/c  Ambulation/Gait             General Gait Details: unable, does not walk at baselin  Stairs            Wheelchair Mobility    Modified Rankin (Stroke Patients Only)       Balance Overall balance assessment: Modified Independent                                           Pertinent Vitals/Pain Pain Assessment:  (minimal chronic back pain, nothing acute)    Home Living Family/patient expects to be discharged to:: Private residence Living Arrangements: Alone Available Help at Discharge: Personal care attendant (apparently pleanty of help with 6-8 hrs/day) Type of Home: Apartment Home Access: Level entry     Home Layout: One level Home Equipment: Walker - 2 wheels;Wheelchair - manual;Hospital bed      Prior Function Level of Independence: Needs assistance   Gait / Transfers Assistance Needed: able to transfer to/from wheel chair, typically with only CGA  ADL's / Homemaking Assistance Needed: Pt. has aides to assist with ADLs in a.m. and p.m. Pt. attends [facility] during the day.        Hand Dominance   Dominant Hand: Right    Extremity/Trunk Assessment  Upper Extremity Assessment Upper Extremity Assessment: Generalized weakness;Overall WFL for tasks assessed (L side weaker than R)    Lower Extremity Assessment Lower Extremity Assessment: Generalized weakness       Communication   Communication: No difficulties  Cognition Arousal/Alertness: Awake/alert Behavior During Therapy: WFL for tasks assessed/performed Overall Cognitive Status: Within Functional Limits for tasks assessed                                        General Comments      Exercises     Assessment/Plan    PT Assessment Patient needs continued PT services  PT Problem List Decreased strength;Decreased mobility;Decreased range of motion;Decreased activity tolerance;Decreased safety awareness;Cardiopulmonary status limiting activity;Pain        PT Treatment Interventions DME instruction;Functional mobility training;Therapeutic activities;Balance training;Therapeutic exercise;Patient/family education;Wheelchair mobility training    PT Goals (Current goals can be found in the Care Plan section)  Acute Rehab PT Goals Patient Stated Goal: go home PT Goal Formulation: With patient Time For Goal Achievement: 10/06/16 Potential to Achieve Goals: Fair    Frequency Min 2X/week   Barriers to discharge        Co-evaluation               AM-PAC PT "6 Clicks" Daily Activity  Outcome Measure Difficulty turning over in bed (including adjusting bedclothes, sheets and blankets)?: A Little Difficulty moving from lying on back to sitting on the side of the bed? : A Little Difficulty sitting down on and standing up from a chair with arms (e.g., wheelchair, bedside commode, etc,.)?: Total Help needed moving to and from a bed to chair (including a wheelchair)?: Total Help needed walking in hospital room?: Total Help needed climbing 3-5 steps with a railing? : Total 6 Click Score: 10    End of Session Equipment Utilized During Treatment: Gait belt Activity Tolerance: Patient tolerated treatment well Patient left: with chair alarm set;with call bell/phone within reach Nurse Communication: Mobility status PT Visit Diagnosis: Muscle weakness (generalized) (M62.81);Difficulty in walking, not elsewhere classified (R26.2)    Time: 7253-6644 PT Time Calculation (min) (ACUTE ONLY): 22 min   Charges:   PT Evaluation $PT Eval Low Complexity: 1 Procedure     PT G Codes:        Kreg Shropshire, DPT 09/22/2016, 11:30 AM

## 2016-09-22 NOTE — H&P (Signed)
History and Physical   [facility] - [facility] @ [facility] Admission History and Physical [employer], D.O.    Patient Name: Jeanette White MR#: [id number] Date of Birth: 16-Jul-1927 Date of Admission: 09/21/2016  Referring MD/NP/PA: Dr. Archie Balboa Primary Care Physician: Gareth Morgan, MD Patient coming from: Home   Chief Complaint:  Chief Complaint  Patient presents with  . Diarrhea  . Weakness    HPI: Jeanette White is a 83 y.o. female with a known history of Metastatic breast cancer, asthma, coronary artery disease status post MI, diabetes, depression, GERD, sleep apnea presents to the emergency department for evaluation of weakness.  Patient was in a usual state of health until the past 2 days patient had been complaining of abdominal pain with nausea. Home health nurse reports increased weakness over the last 2 days and is concerned about the patient's staying home by herself. Patient complains of abdominal pain which is diffuse as well as some weakness, decreased appetite and by mouth intake.  Patient denies fevers/chills, weakness, dizziness, chest pain, shortness of breath, N/V/C/D, abdominal pain, dysuria/frequency, changes in mental status.    Patient was seen in this emergency department in March 2018 for generalized weakness and also had one admission in February 2018 for dysarthria. Otherwise there has been no change in status. Patient has been taking medication as prescribed and there has been no recent change in medication or diet.    EMS/ED Course: Patient received Rocephin and IV fluids.  Review of Systems:  CONSTITUTIONAL: No fever/chills, weight gain/loss, headache. Positive weakness, decreased appetite and by mouth intake. EYES: No blurry or double vision. ENT: No tinnitus, postnasal drip, redness or soreness of the oropharynx. RESPIRATORY: No cough, dyspnea, wheeze.  No hemoptysis.  CARDIOVASCULAR: No chest pain, palpitations, syncope, orthopnea. No  lower extremity edema.  GASTROINTESTINAL: No nausea, vomiting, abdominal pain, diarrhea, constipation.  No hematemesis, melena or hematochezia. GENITOURINARY: No dysuria, frequency, hematuria. ENDOCRINE: No polyuria or nocturia. No heat or cold intolerance. HEMATOLOGY: No anemia, bruising, bleeding. INTEGUMENTARY: No rashes, ulcers, lesions. MUSCULOSKELETAL: No arthritis, gout, dyspnea. NEUROLOGIC: No numbness, tingling, ataxia, seizure-type activity, weakness. PSYCHIATRIC: No anxiety, depression, insomnia.   Past Medical History:  Diagnosis Date  . Anginal pain (HCC)    STABLE  . Arthritis   . Asthma    CHRONIC OBSTRUCTIVE  . B12 deficiency   . Complication of anesthesia    SLOW TO WAKE UP  . Coronary artery disease   . Depression    MAJOR  . Diabetes mellitus without complication ([place])   . Edema    HANDS/FEET  . GERD (gastroesophageal reflux disease)   . HOH (hard of hearing)   . Hypertension   . Myocardial infarction ([place])   . Peripheral vascular disease ([place])   . Polyneuropathy   . Renal anomaly    ARTERY STENOSIS  . Rhinitis, allergic    CHRONIC  . Shoulder disorder    ROTATOR CUFF TEAR  LEFT  . Sleep apnea   . Spinal disorder    STENOSIS    Past Surgical History:  Procedure Laterality Date  . APPENDECTOMY    . CATARACT EXTRACTION W/PHACO Left 12/09/2015   Procedure: CATARACT EXTRACTION PHACO AND INTRAOCULAR LENS PLACEMENT (IOC);  Surgeon: Estill Cotta, MD;  Location: [facility] ORS;  Service: Ophthalmology;  Laterality: Left;  [place] 01:21 AP% 22.8 CDE 31.49 fluid pack lot # [id number]  . CHOLECYSTECTOMY    . CORONARY ANGIOPLASTY     STENT  . CORONARY ARTERY  BYPASS GRAFT    . TOTAL KNEE ARTHROPLASTY Bilateral      reports that she has never smoked. She has never used smokeless tobacco. She reports that she does not drink alcohol or use drugs.  Allergies  Allergen Reactions  . Ace Inhibitors   . Sulfa Antibiotics     Family History  Problem Relation  Age of Onset  . Alzheimer's disease Sister   . Arthritis Sister     Prior to Admission medications   Medication Sig Start Date End Date Taking? Authorizing Provider  acetaminophen (TYLENOL) 500 MG tablet Take 1,000 mg by mouth every 8 (eight) hours as needed for mild pain or moderate pain.   Yes [provider]  atropine 1 % ophthalmic solution Place 2 drops under the tongue every hour as needed (secretions).   Yes [provider]  busPIRone (BUSPAR) 5 MG tablet Take 5 mg by mouth 2 (two) times daily.   Yes [provider]  clopidogrel (PLAVIX) 75 MG tablet Take 75 mg by mouth daily.   Yes [provider]  FLUoxetine (PROZAC) 40 MG capsule Take 40 mg by mouth daily.   Yes [provider]  fluticasone (FLONASE) 50 MCG/ACT nasal spray Place 1 spray into both nostrils daily.   Yes [provider]  furosemide (LASIX) 20 MG tablet Take 20 mg by mouth daily.    Yes [provider]  hydrocortisone cream 0.5 % Apply 1 application topically 2 (two) times daily as needed for itching.   Yes [provider]  ipratropium-albuterol (DUONEB) 0.5-2.5 (3) MG/3ML SOLN Take 3 mLs by nebulization every 6 (six) hours. 05/11/16  Yes Wieting, Richard, MD  letrozole [facility]) 2.5 MG tablet Take 2.5 mg by mouth daily.   Yes [provider]  lidocaine (LIDODERM) 5 % Place 1 patch onto the skin daily. Remove & Discard patch within 12 hours or as directed by MD   Yes [provider]  LORazepam (ATIVAN) 2 MG/ML concentrated solution Take 0.5 mg by mouth every 2 (two) hours as needed for anxiety or sedation.   Yes [provider]  meloxicam (MOBIC) 7.5 MG tablet Take 7.5 mg by mouth daily.   Yes [provider]  Menthol-Zinc Oxide (CALMOSEPTINE) 0.44-20.625 % OINT Apply 1 application topically as needed.   Yes [provider]  morphine (ROXANOL) 20 MG/ML concentrated solution Take 4 mg by mouth every 2 (two)  hours as needed for severe pain.   Yes [provider]  nitroGLYCERIN (NITROSTAT) 0.4 MG SL tablet Place 0.4 mg under the tongue every 5 (five) minutes as needed for chest pain.   Yes [provider]  nystatin (MYCOSTATIN/NYSTOP) powder Apply topically 3 (three) times daily. 05/11/16  Yes Wieting, Richard, MD  pantoprazole (PROTONIX) 40 MG tablet Take 40 mg by mouth daily.    Yes [provider]  potassium chloride (K-DUR) 10 MEQ tablet Take 10-20 mEq by mouth 2 (two) times daily. 2 tablets in the morning and 1 tablet at bedtime   Yes [provider]  pregabalin (LYRICA) 150 MG capsule Take 150 mg by mouth at bedtime.   Yes [provider]  valsartan-hydrochlorothiazide (DIOVAN-HCT) 80-12.5 MG tablet Take 1 tablet by mouth daily.   Yes [provider]    Physical Exam: Vitals:   09/21/16 2200 09/21/16 2231 09/21/16 2300 09/21/16 2330  BP: (!) 116/54 (!) 125/54 (!) 109/52 (!) 118/50  Pulse:   73   Resp: 20 11 15 19   Temp:  TempSrc:      SpO2:   96%   Weight:      Height:        GENERAL: 83 y.o.-year-old White female patient, well-developed, well-nourished lying in the bed in no acute distress.  Pleasant and cooperative.   HEENT: Head atraumatic, normocephalic. Pupils equal, round, reactive to light and accommodation. No scleral icterus. Extraocular muscles intact. Nares are patent. Oropharynx is clear. Mucus membranes dry. NECK: Supple, full range of motion. No JVD, no bruit heard. No thyroid enlargement, no tenderness, no cervical lymphadenopathy. CHEST: Normal breath sounds bilaterally. No wheezing, rales, rhonchi or crackles. No use of accessory muscles of respiration.  No reproducible chest wall tenderness.  CARDIOVASCULAR: S1, S2 normal. No murmurs, rubs, or gallops. Cap refill <2 seconds. Pulses intact distally.  ABDOMEN: Soft, nondistended, nontender. No rebound, guarding, rigidity. Normoactive bowel sounds present in all four  quadrants. No organomegaly or mass. EXTREMITIES: No pedal edema, cyanosis, or clubbing. No calf tenderness or Homan's sign.  NEUROLOGIC: The patient is alert and oriented x 3. Cranial nerves II through XII are grossly intact with no focal sensorimotor deficit. Muscle strength 5/5 in all extremities. Sensation intact. Gait not checked. PSYCHIATRIC:  Normal affect, mood, thought content. SKIN: Warm, dry, and intact without obvious rash, lesion, or ulcer.    Labs on Admission:  CBC:  Recent Labs Lab 09/21/16 2146  WBC 5.7  HGB 11.9*  HCT 35.2  MCV 87.8  PLT 277   Basic Metabolic Panel:  Recent Labs Lab 09/21/16 2146  NA 139  K 4.3  CL 103  CO2 28  GLUCOSE 104*  BUN 18  CREATININE 0.68  CALCIUM 8.5*   GFR: Estimated Creatinine Clearance: 52.7 mL/min (by C-G formula based on SCr of 0.68 mg/dL). Liver Function Tests:  Recent Labs Lab 09/21/16 2146  AST 99*  ALT 30  ALKPHOS 194*  BILITOT 1.2  PROT 6.6  ALBUMIN 3.4*    Recent Labs Lab 09/21/16 2146  LIPASE 30   No results for input(s): AMMONIA in the last 168 hours. Coagulation Profile: No results for input(s): INR, PROTIME in the last 168 hours. Cardiac Enzymes: No results for input(s): CKTOTAL, CKMB, CKMBINDEX, TROPONINI in the last 168 hours. BNP (last 3 results) No results for input(s): PROBNP in the last 8760 hours. HbA1C: No results for input(s): HGBA1C in the last 72 hours. CBG: No results for input(s): GLUCAP in the last 168 hours. Lipid Profile: No results for input(s): CHOL, HDL, LDLCALC, TRIG, CHOLHDL, LDLDIRECT in the last 72 hours. Thyroid Function Tests: No results for input(s): TSH, T4TOTAL, FREET4, T3FREE, THYROIDAB in the last 72 hours. Anemia Panel: No results for input(s): VITAMINB12, FOLATE, FERRITIN, TIBC, IRON, RETICCTPCT in the last 72 hours. Urine analysis:    Component Value Date/Time   COLORURINE YELLOW (A) 09/21/2016 2147   APPEARANCEUR HAZY (A) 09/21/2016 2147    APPEARANCEUR Clear 04/18/2013 1944   LABSPEC 1.017 09/21/2016 2147   LABSPEC 1.012 04/18/2013 1944   PHURINE 8.0 09/21/2016 2147   GLUCOSEU NEGATIVE 09/21/2016 2147   GLUCOSEU Negative 04/18/2013 1944   [place] NEGATIVE 09/21/2016 2147   [place] NEGATIVE 09/21/2016 2147   BILIRUBINUR Negative 04/18/2013 1944   [place] NEGATIVE 09/21/2016 2147   PROTEINUR 30 (A) 09/21/2016 2147   NITRITE POSITIVE (A) 09/21/2016 2147   LEUKOCYTESUR LARGE (A) 09/21/2016 2147   LEUKOCYTESUR Negative 04/18/2013 1944   Sepsis Labs: @LABRCNTIP (procalcitonin:4,lacticidven:4) )No results found for this or any previous visit (from the past 240 hour(s)).   Radiological  Exams on Admission: No results found.   Assessment/Plan  This is a 83 y.o. female with a history of Metastatic breast cancer, asthma, coronary artery disease status post MI, diabetes, depression, GERD, sleep apnea now being admitted with:  #. Weakness, urinary tract infection -Admit to inpatient -IV fluids and IV antibiotics -Follow up urine and blood cultures -PT evaluation  #. History of GERD -Continue Protonix  #. History of depression - Continue BuSpar, Prozac  #. History of asthma - Continue Flonase duo nebs as needed  #. History of breast cancer - Continue letrozole  #. History of coronary artery disease - Continue nitroglycerin, Plavix  #. History of hypertension - Continue valsartan, hydrochlorothiazide, Lasix  Admission status: Inpatient IV Fluids: Normal saline Diet/Nutrition: Heart healthy Consults called: None  DVT Px: Lovenox, SCDs and early ambulation. Code Status: Full Code  Disposition Plan: To home in 1-2 days  All the records are reviewed and case discussed with ED provider. Management plans discussed with the patient and/or family who express understanding and agree with plan of care.  Gianno Volner D.O. on 09/22/2016 at 12:26 AM Between 7am to 6pm - Pager - [phone] After 6pm go to  [email or web address] - [employer] [facility] [place] [facility] Office [phone] CC: Primary care physician; Gareth Morgan, MD   09/22/2016, 12:26 AM

## 2016-09-22 NOTE — Progress Notes (Signed)
Pharmacy Antibiotic Note  Jeanette White is a 83 y.o. female admitted on 09/21/2016 with UTI.  Pharmacy has been consulted for Ceftriaxone dosing.  Plan: Will initiate ceftriaxone 1g IV daily  Height: 5\' 3"  (160 cm) Weight: 213 lb 14.4 oz (97 kg) IBW/kg (Calculated) : 52.4  Temp (24hrs), Avg:98.9 F (37.2 C), Min:98.1 F (36.7 C), Max:100.1 F (37.8 C)   Recent Labs Lab 09/21/16 2146 09/22/16 0418  WBC 5.7 3.4*  CREATININE 0.68 0.67    Estimated Creatinine Clearance: 55.9 mL/min (by C-G formula based on SCr of 0.67 mg/dL).    Allergies  Allergen Reactions  . Ace Inhibitors   . Sulfa Antibiotics      Thank you for allowing pharmacy to be a part of this patient's care.  Tobie Lords, PharmD, BCPS Clinical Pharmacist 09/22/2016

## 2016-09-22 NOTE — Care Management CC44 (Signed)
Condition Code 44 Documentation Completed  Patient Details  Name: Abygale Karpf MRN: [id number] Date of Birth: [birth year]   Condition Code 44 given:  Yes Patient signature on Condition Code 44 notice:  Yes Documentation of 2 MD's agreement:  Yes Code 44 added to claim:  Yes    Shelbie Ammons, RN 09/22/2016, 11:13 AM

## 2016-09-22 NOTE — Progress Notes (Signed)
[facility] to provide transportation for discharge at 1230. Madlyn Frankel, RN

## 2016-09-22 NOTE — Care Management (Signed)
Admitted to this facility with weakness. Lives alone. A member of the [facility] program x 6 years. Participate at the facility 4 days a week. Neighbor and aide helps with errands. Hospital bed and wheelchair  in the home. No skilled facility in the past, No home oxygen.  Admission status changed to observation. [facility] will transport. Discharge to home today per Dr. Reginia Forts RN MSN CCM Care Management [phone]

## 2016-09-22 NOTE — Plan of Care (Signed)
Problem: Education: Goal: Knowledge of [facility] Education information/materials will improve Outcome: Progressing Pt likes to be called Jeanette White  Past Medical History:  Diagnosis  Date  .  Anginal pain (HCC)  STABLE  .  Arthritis  .  Asthma  CHRONIC OBSTRUCTIVE  .  B12 deficiency  .  Complication of anesthesia  SLOW TO WAKE UP  .  Coronary artery disease  .  Depression  MAJOR  .  Diabetes mellitus without complication ([place])  .  Edema  HANDS/FEET  .  GERD (gastroesophageal reflux disease)  .  HOH (hard of hearing)  .  Hypertension  .  Myocardial infarction ([place])  .  Peripheral vascular disease ([place])  .  Polyneuropathy  .  Renal anomaly  ARTERY STENOSIS  .  Rhinitis, allergic  CHRONIC  .  Shoulder disorder  ROTATOR CUFF TEAR LEFT  .  Sleep apnea  .  Spinal disorder  STENOSIS  Pt is well controlled with home medications

## 2016-09-22 NOTE — Progress Notes (Signed)
Patient discharged with [facility]. Madlyn Frankel, RN

## 2016-09-22 NOTE — Progress Notes (Signed)
Discharge instructions given and went over with patient at bedside. Prescription reviewed. All questions answered. Patient to discharge to home with [facility]. Awaiting transportation. Madlyn Frankel, RN

## 2016-09-22 NOTE — Discharge Summary (Signed)
[name] [place] NAME: Jeanette White    MR#:  [id number]  DATE OF BIRTH:  [birth year]  DATE OF ADMISSION:  09/21/2016 ADMITTING PHYSICIAN: Harvie Bridge, DO  DATE OF DISCHARGE: 09/22/16  PRIMARY CARE PHYSICIAN: Gareth Morgan, MD    ADMISSION DIAGNOSIS:  vomiting and diarrhea  DISCHARGE DIAGNOSIS:  UTI Clinical dehydration-resolved Relative hypotension-asymptomatic  SECONDARY DIAGNOSIS:   Past Medical History:  Diagnosis Date  . Anginal pain (HCC)    STABLE  . Arthritis   . Asthma    CHRONIC OBSTRUCTIVE  . B12 deficiency   . Complication of anesthesia    SLOW TO WAKE UP  . Coronary artery disease   . Depression    MAJOR  . Diabetes mellitus without complication ([place])   . Edema    HANDS/FEET  . GERD (gastroesophageal reflux disease)   . HOH (hard of hearing)   . Hypertension   . Myocardial infarction ([place])   . Peripheral vascular disease ([place])   . Polyneuropathy   . Renal anomaly    ARTERY STENOSIS  . Rhinitis, allergic    CHRONIC  . Shoulder disorder    ROTATOR CUFF TEAR  LEFT  . Sleep apnea   . Spinal disorder    STENOSIS    HOSPITAL COURSE:  83 y.o. female with a history of Metastatic breast cancer, asthma, coronary artery disease status post MI, diabetes, depression, GERD, sleep apnea now being admitted with:  #. Weakness, urinary tract infection -recieved IV fluids and IV antibiotics---change to oral keflex UC pending -UA abnormal c/w UTI  #. History of GERD -Continue Protonix  #. History of depression - Continue BuSpar, Prozac  #. History of asthma - Continue Flonase duo nebs as needed  #. History of breast cancer - Continue letrozole  #. History of coronary artery disease - Continue nitroglycerin, Plavix  #. History of hypertension but has relative hypotension - pt advised to resume valsartan, hydrochlorothiazide, Lasix after 2 days  Pt is feeling better and  requesting be discharged. No fever CONSULTS OBTAINED:  Treatment Team:  Fritzi Mandes, MD  DRUG ALLERGIES:   Allergies  Allergen Reactions  . Ace Inhibitors   . Sulfa Antibiotics     DISCHARGE MEDICATIONS:   Current Discharge Medication List    START taking these medications   Details  cephALEXin (KEFLEX) 500 MG capsule Take 1 capsule (500 mg total) by mouth every 12 (twelve) hours. Qty: 14 capsule, Refills: 0      CONTINUE these medications which have CHANGED   Details  furosemide (LASIX) 20 MG tablet Take 1 tablet (20 mg total) by mouth daily. Qty: 30 tablet, Refills: 0    valsartan-hydrochlorothiazide (DIOVAN-HCT) 80-12.5 MG tablet Take 1 tablet by mouth daily. Pt advised to start from 09/24/16 Qty: 30 tablet, Refills: 0      CONTINUE these medications which have NOT CHANGED   Details  acetaminophen (TYLENOL) 500 MG tablet Take 1,000 mg by mouth every 8 (eight) hours as needed for mild pain or moderate pain.    atropine 1 % ophthalmic solution Place 2 drops under the tongue every hour as needed (secretions).    busPIRone (BUSPAR) 5 MG tablet Take 5 mg by mouth 2 (two) times daily.    clopidogrel (PLAVIX) 75 MG tablet Take 75 mg by mouth daily.    FLUoxetine (PROZAC) 40 MG capsule Take 40 mg by mouth daily.    fluticasone (FLONASE) 50 MCG/ACT nasal spray Place  1 spray into both nostrils daily.    hydrocortisone cream 0.5 % Apply 1 application topically 2 (two) times daily as needed for itching.    ipratropium-albuterol (DUONEB) 0.5-2.5 (3) MG/3ML SOLN Take 3 mLs by nebulization every 6 (six) hours. Qty: 360 mL, Refills: 0    letrozole (FEMARA) 2.5 MG tablet Take 2.5 mg by mouth daily.    lidocaine (LIDODERM) 5 % Place 1 patch onto the skin daily. Remove & Discard patch within 12 hours or as directed by MD    LORazepam (ATIVAN) 2 MG/ML concentrated solution Take 0.5 mg by mouth every 2 (two) hours as needed for anxiety or sedation.    meloxicam (MOBIC) 7.5 MG  tablet Take 7.5 mg by mouth daily.    Menthol-Zinc Oxide (CALMOSEPTINE) 0.44-20.625 % OINT Apply 1 application topically as needed.    morphine (ROXANOL) 20 MG/ML concentrated solution Take 4 mg by mouth every 2 (two) hours as needed for severe pain.    nitroGLYCERIN (NITROSTAT) 0.4 MG SL tablet Place 0.4 mg under the tongue every 5 (five) minutes as needed for chest pain.    nystatin (MYCOSTATIN/NYSTOP) powder Apply topically 3 (three) times daily. Qty: 15 g, Refills: 0    pantoprazole (PROTONIX) 40 MG tablet Take 40 mg by mouth daily.     potassium chloride (K-DUR) 10 MEQ tablet Take 10-20 mEq by mouth 2 (two) times daily. 2 tablets in the morning and 1 tablet at bedtime    pregabalin (LYRICA) 150 MG capsule Take 150 mg by mouth at bedtime.        If you experience worsening of your admission symptoms, develop shortness of breath, life threatening emergency, suicidal or homicidal thoughts you must seek medical attention immediately by calling 911 or calling your MD immediately  if symptoms less severe.  You Must read complete instructions/literature along with all the possible adverse reactions/side effects for all the Medicines you take and that have been prescribed to you. Take any new Medicines after you have completely understood and accept all the possible adverse reactions/side effects.   Please note  You were cared for by a hospitalist during your hospital stay. If you have any questions about your discharge medications or the care you received while you were in the hospital after you are discharged, you can call the unit and asked to speak with the hospitalist on call if the hospitalist that took care of you is not available. Once you are discharged, your primary care physician will handle any further medical issues. Please note that NO REFILLS for any discharge medications will be authorized once you are discharged, as it is imperative that you return to your primary care  physician (or establish a relationship with a primary care physician if you do not have one) for your aftercare needs so that they can reassess your need for medications and monitor your lab values. Today   SUBJECTIVE    Doing well. I want to go home VITAL SIGNS:  Blood pressure (!) 104/50, pulse (!) 55, temperature 98.6 F (37 C), resp. rate 20, height 5\' 3"  (1.6 m), weight 97 kg (213 lb 14.4 oz), SpO2 94 %.  I/O:    Intake/Output Summary (Last 24 hours) at 09/22/16 1021 Last data filed at 09/22/16 0936  Gross per 24 hour  Intake              574 ml  Output              200 ml  Net              374 ml    PHYSICAL EXAMINATION:  GENERAL:  83 y.o.-year-old patient lying in the bed with no acute distress. obese EYES: Pupils equal, round, reactive to light and accommodation. No scleral icterus. Extraocular muscles intact.  HEENT: Head atraumatic, normocephalic. Oropharynx and nasopharynx clear.  NECK:  Supple, no jugular venous distention. No thyroid enlargement, no tenderness.  LUNGS: Normal breath sounds bilaterally, no wheezing, rales,rhonchi or crepitation. No use of accessory muscles of respiration.  CARDIOVASCULAR: S1, S2 normal. No murmurs, rubs, or gallops.  ABDOMEN: Soft, non-tender, non-distended. Bowel sounds present. No organomegaly or mass.  EXTREMITIES: No pedal edema, cyanosis, or clubbing.  NEUROLOGIC: Cranial nerves II through XII are intact. Muscle strength 5/5 in all extremities. Sensation intact. Gait not checked.  PSYCHIATRIC: The patient is alert and oriented x 3.  SKIN: No obvious rash, lesion, or ulcer.   DATA REVIEW:   CBC   Recent Labs Lab 09/22/16 0418  WBC 3.4*  HGB 10.5*  HCT 31.1*  PLT 162    Chemistries   Recent Labs Lab 09/21/16 2146 09/22/16 0418  NA 139 140  K 4.3 3.4*  CL 103 105  CO2 28 30  GLUCOSE 104* 95  BUN 18 16  CREATININE 0.68 0.67  CALCIUM 8.5* 8.0*  AST 99*  --   ALT 30  --   ALKPHOS 194*  --   BILITOT 1.2  --      Microbiology Results   No results found for this or any previous visit (from the past 240 hour(s)).  RADIOLOGY:  No results found.   Management plans discussed with the patient, family and they are in agreement.  CODE STATUS:     Code Status Orders        Start     Ordered   09/22/16 0238  Full code  Continuous     09/22/16 0237    Code Status History    Date Active Date Inactive Code Status Order ID Comments User Context   06/18/2016  2:13 PM 06/19/2016  6:44 PM DNR [id number]  Vaughan Basta, MD Inpatient   05/07/2016  6:54 AM 05/11/2016  4:14 PM DNR [id number]  Saundra Shelling, MD Inpatient    Advance Directive Documentation     Most Recent Value  Type of Advance Directive  Healthcare Power of [place], Living will  Pre-existing out of facility DNR order (yellow form or pink MOST form)  -  "MOST" Form in Place?  -      TOTAL TIME TAKING CARE OF THIS PATIENT: *40* minutes.    Satia Winger M.D on 09/22/2016 at 10:21 AM  Between 7am to 6pm - Pager - [phone] After 6pm go to [email or web address] - password [email or web address] [facility]  Office  [phone]  CC: Primary care physician; Gareth Morgan, MD

## 2016-09-22 NOTE — Discharge Instructions (Signed)
Pt advised to hold her BP meds for 2 days

## 2016-09-24 ENCOUNTER — Emergency Department: Payer: Medicare (Managed Care)

## 2016-09-24 ENCOUNTER — Emergency Department
Admission: EM | Admit: 2016-09-24 | Discharge: 2016-09-24 | Disposition: A | Discharge status: Home / Self Care | Payer: Medicare (Managed Care) | Attending: Emergency Medicine | Admitting: Emergency Medicine

## 2016-09-24 DIAGNOSIS — I2510 Atherosclerotic heart disease of native coronary artery without angina pectoris: Secondary | ICD-10-CM | POA: Insufficient documentation

## 2016-09-24 DIAGNOSIS — I10 Essential (primary) hypertension: Secondary | ICD-10-CM | POA: Diagnosis not present

## 2016-09-24 DIAGNOSIS — R1084 Generalized abdominal pain: Secondary | ICD-10-CM | POA: Diagnosis not present

## 2016-09-24 DIAGNOSIS — E119 Type 2 diabetes mellitus without complications: Secondary | ICD-10-CM | POA: Diagnosis not present

## 2016-09-24 DIAGNOSIS — R110 Nausea: Secondary | ICD-10-CM | POA: Insufficient documentation

## 2016-09-24 DIAGNOSIS — I252 Old myocardial infarction: Secondary | ICD-10-CM | POA: Insufficient documentation

## 2016-09-24 DIAGNOSIS — J45909 Unspecified asthma, uncomplicated: Secondary | ICD-10-CM | POA: Diagnosis not present

## 2016-09-24 DIAGNOSIS — R197 Diarrhea, unspecified: Secondary | ICD-10-CM | POA: Diagnosis not present

## 2016-09-24 LAB — CBC WITH DIFFERENTIAL/PLATELET
Basophils Absolute: 0 10*3/uL (ref 0–0.1)
Basophils Relative: 1 %
Eosinophils Absolute: 0.2 10*3/uL (ref 0–0.7)
Eosinophils Relative: 4 %
HEMATOCRIT: 33.6 % — AB (ref 35.0–47.0)
HEMOGLOBIN: 11.2 g/dL — AB (ref 12.0–16.0)
LYMPHS ABS: 1.2 10*3/uL (ref 1.0–3.6)
LYMPHS PCT: 21 %
MCH: 28.9 pg (ref 26.0–34.0)
MCHC: 33.3 g/dL (ref 32.0–36.0)
MCV: 86.8 fL (ref 80.0–100.0)
Monocytes Absolute: 0.4 10*3/uL (ref 0.2–0.9)
Monocytes Relative: 7 %
NEUTROS ABS: 3.7 10*3/uL (ref 1.4–6.5)
NEUTROS PCT: 67 %
PLATELETS: 167 10*3/uL (ref 150–440)
RBC: 3.87 MIL/uL (ref 3.80–5.20)
RDW: 13.7 % (ref 11.5–14.5)
WBC: 5.5 10*3/uL (ref 3.6–11.0)

## 2016-09-24 LAB — URINE CULTURE: Culture: >=100000 — AB

## 2016-09-24 LAB — BASIC METABOLIC PANEL
Anion gap: 7 (ref 5–15)
BUN: 12 mg/dL (ref 6–20)
CO2: 29 mmol/L (ref 22–32)
CREATININE: 0.63 mg/dL (ref 0.44–1.00)
Calcium: 8.3 mg/dL — ABNORMAL LOW (ref 8.9–10.3)
Chloride: 103 mmol/L (ref 101–111)
Glucose, Bld: 82 mg/dL (ref 65–99)
Potassium: 3.1 mmol/L — ABNORMAL LOW (ref 3.5–5.1)
SODIUM: 139 mmol/L (ref 135–145)

## 2016-09-24 LAB — HEPATIC FUNCTION PANEL
ALBUMIN: 3.2 g/dL — AB (ref 3.5–5.0)
ALK PHOS: 212 U/L — AB (ref 38–126)
ALT: 42 U/L (ref 14–54)
AST: 48 U/L — ABNORMAL HIGH (ref 15–41)
Bilirubin, Direct: 0.2 mg/dL (ref 0.1–0.5)
Indirect Bilirubin: 0.6 mg/dL (ref 0.3–0.9)
TOTAL PROTEIN: 6.2 g/dL — AB (ref 6.5–8.1)
Total Bilirubin: 0.8 mg/dL (ref 0.3–1.2)

## 2016-09-24 LAB — LIPASE, BLOOD: LIPASE: 17 U/L (ref 11–51)

## 2016-09-24 MED ORDER — IOPAMIDOL (ISOVUE-370) INJECTION 76%
100.0000 mL | Freq: Once | INTRAVENOUS | Status: AC | PRN
  Administered 2016-09-24: 100 mL via INTRAVENOUS

## 2016-09-24 MED ORDER — SODIUM CHLORIDE 0.9 % IV BOLUS (SEPSIS)
1000.0000 mL | Freq: Once | INTRAVENOUS | Status: AC
  Administered 2016-09-24: 1000 mL via INTRAVENOUS

## 2016-09-24 NOTE — ED Notes (Signed)
Assisted nurse with cleaning pt, changing bed stool sample taken.AS

## 2016-09-24 NOTE — ED Provider Notes (Signed)
[facility] Emergency Department Provider Note  ____________________________________________   First MD Initiated Contact with Patient 09/24/16 0116     (approximate)  I have reviewed the triage vital signs and the nursing notes.   HISTORY  Chief Complaint Diarrhea    HPI Jeanette White is a 83 y.o. female who comes to the emergency department via EMS for diarrhea Tuesday. She came to our hospital 2 days ago and was admitted with a diagnosis of UTI with culture showing Proteus. She received a dose of ceftriaxone was discharged on Keflex. She said that she initially came to the emergency department for persistent diarrhea and the diarrhea has not improved. She estimates that in the past 24 hours she has had 12-15 loose watery stools. She lives alone and is unable to care for herself given the constant sanitary needs. She denies abdominal surgical history.   Past Medical History:  Diagnosis Date  . Anginal pain (HCC)    STABLE  . Arthritis   . Asthma    CHRONIC OBSTRUCTIVE  . B12 deficiency   . Complication of anesthesia    SLOW TO WAKE UP  . Coronary artery disease   . Depression    MAJOR  . Diabetes mellitus without complication ([place])   . Edema    HANDS/FEET  . GERD (gastroesophageal reflux disease)   . HOH (hard of hearing)   . Hypertension   . Myocardial infarction ([place])   . Peripheral vascular disease ([place])   . Polyneuropathy   . Renal anomaly    ARTERY STENOSIS  . Rhinitis, allergic    CHRONIC  . Shoulder disorder    ROTATOR CUFF TEAR  LEFT  . Sleep apnea   . Spinal disorder    STENOSIS    Patient Active Problem List   Diagnosis Date Noted  . Weakness 09/22/2016  . Slurred speech 06/17/2016  . Hypercalcemia 05/07/2016  . Pulmonary nodules/lesions, multiple 05/07/2016    Past Surgical History:  Procedure Laterality Date  . APPENDECTOMY    . CATARACT EXTRACTION W/PHACO Left 12/09/2015   Procedure: CATARACT EXTRACTION  PHACO AND INTRAOCULAR LENS PLACEMENT (IOC);  Surgeon: Estill Cotta, MD;  Location: [facility] ORS;  Service: Ophthalmology;  Laterality: Left;  [place] 01:21 AP% 22.8 CDE 31.49 fluid pack lot # [id number]  . CHOLECYSTECTOMY    . CORONARY ANGIOPLASTY     STENT  . CORONARY ARTERY BYPASS GRAFT    . TOTAL KNEE ARTHROPLASTY Bilateral     Prior to Admission medications   Medication Sig Start Date End Date Taking? Authorizing Provider  acetaminophen (TYLENOL) 500 MG tablet Take 1,000 mg by mouth every 8 (eight) hours as needed for mild pain or moderate pain.    [provider]  atropine 1 % ophthalmic solution Place 2 drops under the tongue every hour as needed (secretions).    [provider]  busPIRone (BUSPAR) 5 MG tablet Take 5 mg by mouth 2 (two) times daily.    [provider]  cephALEXin (KEFLEX) 500 MG capsule Take 1 capsule (500 mg total) by mouth every 12 (twelve) hours. 09/22/16   Fritzi Mandes, MD  clopidogrel (PLAVIX) 75 MG tablet Take 75 mg by mouth daily.    [provider]  FLUoxetine (PROZAC) 40 MG capsule Take 40 mg by mouth daily.    [provider]  fluticasone (FLONASE) 50 MCG/ACT nasal spray Place 1 spray into both nostrils daily.    [provider]  furosemide (LASIX) 20 MG  tablet Take 1 tablet (20 mg total) by mouth daily. 09/24/16   Fritzi Mandes, MD  hydrocortisone cream 0.5 % Apply 1 application topically 2 (two) times daily as needed for itching.    [provider]  ipratropium-albuterol (DUONEB) 0.5-2.5 (3) MG/3ML SOLN Take 3 mLs by nebulization every 6 (six) hours. 05/11/16   Loletha Grayer, MD  letrozole [facility]) 2.5 MG tablet Take 2.5 mg by mouth daily.    [provider]  lidocaine (LIDODERM) 5 % Place 1 patch onto the skin daily. Remove & Discard patch within 12 hours or as directed by MD    [provider]  LORazepam (ATIVAN) 2 MG/ML concentrated solution Take 0.5 mg by mouth every 2 (two) hours  as needed for anxiety or sedation.    [provider]  meloxicam (MOBIC) 7.5 MG tablet Take 7.5 mg by mouth daily.    [provider]  Menthol-Zinc Oxide (CALMOSEPTINE) 0.44-20.625 % OINT Apply 1 application topically as needed.    [provider]  morphine (ROXANOL) 20 MG/ML concentrated solution Take 4 mg by mouth every 2 (two) hours as needed for severe pain.    [provider]  nitroGLYCERIN (NITROSTAT) 0.4 MG SL tablet Place 0.4 mg under the tongue every 5 (five) minutes as needed for chest pain.    [provider]  nystatin (MYCOSTATIN/NYSTOP) powder Apply topically 3 (three) times daily. 05/11/16   Loletha Grayer, MD  pantoprazole (PROTONIX) 40 MG tablet Take 40 mg by mouth daily.     [provider]  potassium chloride (K-DUR) 10 MEQ tablet Take 10-20 mEq by mouth 2 (two) times daily. 2 tablets in the morning and 1 tablet at bedtime    [provider]  pregabalin (LYRICA) 150 MG capsule Take 150 mg by mouth at bedtime.    [provider]  valsartan-hydrochlorothiazide (DIOVAN-HCT) 80-12.5 MG tablet Take 1 tablet by mouth daily. Pt advised to start from 09/24/16 09/24/16   Fritzi Mandes, MD    Allergies Ace inhibitors and Sulfa antibiotics  Family History  Problem Relation Age of Onset  . Alzheimer's disease Sister   . Arthritis Sister     Social History Social History  Substance Use Topics  . Smoking status: Never Smoker  . Smokeless tobacco: Never Used  . Alcohol use No    Review of Systems Constitutional: No fever/chills Eyes: No visual changes. ENT: No sore throat. Cardiovascular: Denies chest pain. Respiratory: Denies shortness of breath. Gastrointestinal: Positive abdominal pain.  Positive nausea, no vomiting.  Positive diarrhea.  No constipation. Genitourinary: Negative for dysuria. Musculoskeletal: Negative for back pain. Skin: Negative for rash. Neurological: Negative for headaches, focal  weakness or numbness.  10-point ROS otherwise negative.  ____________________________________________   PHYSICAL EXAM:  VITAL SIGNS: ED Triage Vitals  Enc Vitals Group     BP      Pulse      Resp      Temp      Temp src      SpO2      Weight      Height      Head Circumference      Peak Flow      Pain Score      Pain Loc      Pain Edu?      Excl. in [place]?     Constitutional: Alert and oriented x 4 well appearing nontoxic no diaphoresis speaks in full, clear sentences Eyes: PERRL EOMI. Head: Atraumatic. Nose: No congestion/rhinnorhea.  Mouth/Throat: No trismus Neck: No stridor.   Cardiovascular: Normal rate, regular rhythm. Grossly normal heart sounds.  Good peripheral circulation. Respiratory: Normal respiratory effort.  No retractions. Lungs CTAB and moving good air Gastrointestinal: Soft nondistended diffuse mild tenderness to percussion with no rebound no guarding and no frank peritonitis Musculoskeletal: No lower extremity edema   Neurologic:  Normal speech and language. No gross focal neurologic deficits are appreciated. Skin:  Skin is warm, dry and intact. No rash noted. Psychiatric: Mood and affect are normal. Speech and behavior are normal.    _____________________________________   LABS (all labs ordered are listed, but only abnormal results are displayed)  Labs Reviewed  BASIC METABOLIC PANEL - Abnormal; Notable for the following:       Result Value   Potassium 3.1 (*)    Calcium 8.3 (*)    All other components within normal limits  HEPATIC FUNCTION PANEL - Abnormal; Notable for the following:    Total Protein 6.2 (*)    Albumin 3.2 (*)    AST 48 (*)    Alkaline Phosphatase 212 (*)    All other components within normal limits  CBC WITH DIFFERENTIAL/PLATELET - Abnormal; Notable for the following:    Hemoglobin 11.2 (*)    HCT 33.6 (*)    All other components within normal limits  GASTROINTESTINAL PANEL BY PCR, STOOL (REPLACES STOOL CULTURE)    LIPASE, BLOOD    Labs suggestive of poor nutrition status with low albumin otherwise unremarkable __________________________________________  EKG   ____________________________________________  RADIOLOGY  CT scan of the abdomen and pelvis shows no acute disease ____________________________________________   PROCEDURES  Procedure(s) performed: no  Procedures  Critical Care performed: no  Observation: no ____________________________________________   INITIAL IMPRESSION / ASSESSMENT AND PLAN / ED COURSE  Pertinent labs & imaging results that were available during my care of the patient were reviewed by me and considered in my medical decision making (see chart for details).  While the patient has a benign abdomen, 5 days of continuous diarrhea is certainly concerning. At this point I will give her IV hydration, check a bio fire, and I feel she warrants a CT scan.    ----------------------------------------- 4:21 AM on 09/24/2016 -----------------------------------------  Fortunately the patient's CT scan is reassuring and she has no surgical cause of diarrhea. At this point she is comfortable going home today as she has a home health nurse showing up and she is scheduled to be placed into a skilled nursing facility later on today. She does not warrant antibiotics. She is discharged home in improved condition. ____________________________________________   FINAL CLINICAL IMPRESSION(S) / ED DIAGNOSES  Final diagnoses:  Diarrhea, unspecified type      NEW MEDICATIONS STARTED DURING THIS VISIT:  Discharge Medication List as of 09/24/2016  4:21 AM       Note:  This document was prepared using Dragon voice recognition software and may include unintentional dictation errors.     Darel Hong, MD 09/24/16 2232

## 2016-09-24 NOTE — ED Notes (Signed)
Pt cleansed of small amount of brown liquid stool and urine.

## 2016-09-24 NOTE — Discharge Instructions (Signed)
Please take all of your previous antibiotics as prescribed and follow-up with your primary care physician on Monday for recheck. Return to the emergency department for any concerns.  It was a pleasure to take care of you today, and thank you for coming to our emergency department.  If you have any questions or concerns before leaving please ask the nurse to grab me and I'm more than happy to go through your aftercare instructions again.  If you were prescribed any opioid pain medication today such as Norco, Vicodin, Percocet, morphine, hydrocodone, or oxycodone please make sure you do not drive when you are taking this medication as it can alter your ability to drive safely.  If you have any concerns once you are home that you are not improving or are in fact getting worse before you can make it to your follow-up appointment, please do not hesitate to call 911 and come back for further evaluation.  Darel Hong MD  Results for orders placed or performed during the hospital encounter of 78/67/67  Basic metabolic panel  Result Value Ref Range   Sodium 139 135 - 145 mmol/L   Potassium 3.1 (L) 3.5 - 5.1 mmol/L   Chloride 103 101 - 111 mmol/L   CO2 29 22 - 32 mmol/L   Glucose, Bld 82 65 - 99 mg/dL   BUN 12 6 - 20 mg/dL   Creatinine, Ser 0.63 0.44 - 1.00 mg/dL   Calcium 8.3 (L) 8.9 - 10.3 mg/dL   GFR calc non Af Amer >60 >60 mL/min   GFR calc Af Amer >60 >60 mL/min   Anion gap 7 5 - 15  Hepatic function panel  Result Value Ref Range   Total Protein 6.2 (L) 6.5 - 8.1 g/dL   Albumin 3.2 (L) 3.5 - 5.0 g/dL   AST 48 (H) 15 - 41 U/L   ALT 42 14 - 54 U/L   Alkaline Phosphatase 212 (H) 38 - 126 U/L   Total Bilirubin 0.8 0.3 - 1.2 mg/dL   Bilirubin, Direct 0.2 0.1 - 0.5 mg/dL   Indirect Bilirubin 0.6 0.3 - 0.9 mg/dL  CBC with Differential  Result Value Ref Range   WBC 5.5 3.6 - 11.0 K/uL   RBC 3.87 3.80 - 5.20 MIL/uL   Hemoglobin 11.2 (L) 12.0 - 16.0 g/dL   HCT 33.6 (L) 35.0 - 47.0 %   MCV 86.8 80.0 - 100.0 fL   MCH 28.9 26.0 - 34.0 pg   MCHC 33.3 32.0 - 36.0 g/dL   RDW 13.7 11.5 - 14.5 %   Platelets 167 150 - 440 K/uL   Neutrophils Relative % 67 %   Neutro Abs 3.7 1.4 - 6.5 K/uL   Lymphocytes Relative 21 %   Lymphs Abs 1.2 1.0 - 3.6 K/uL   Monocytes Relative 7 %   Monocytes Absolute 0.4 0.2 - 0.9 K/uL   Eosinophils Relative 4 %   Eosinophils Absolute 0.2 0 - 0.7 K/uL   Basophils Relative 1 %   Basophils Absolute 0.0 0 - 0.1 K/uL  Lipase, blood  Result Value Ref Range   Lipase 17 11 - 51 U/L   Ct Abdomen Pelvis W Contrast  Result Date: 09/24/2016 CLINICAL DATA:  Diarrhea for 2 days.  Abdominal pain for 5 days. EXAM: CT ABDOMEN AND PELVIS WITH CONTRAST TECHNIQUE: Multidetector CT imaging of the abdomen and pelvis was performed using the standard protocol following bolus administration of intravenous contrast. CONTRAST:  100 mL Isovue 370 COMPARISON:  05/07/2016  FINDINGS: Lower chest: Multiple pulmonary nodules and pleural thickening demonstrated in the lung bases consistent with known metastatic disease. Pleural effusions have improved since previous study. Coronary artery calcifications. A radiopaque tablet is demonstrated in the distal esophagus. Hepatobiliary: No focal liver abnormality is seen. Status post cholecystectomy. No biliary dilatation. Pancreas: Fatty atrophy of the pancreas. No pancreatic ductal dilatation or inflammation. Spleen: Normal in size without focal abnormality. Adrenals/Urinary Tract: No adrenal gland nodules. Cyst in the lower pole of the left kidney. Nephrograms are symmetrical. No hydronephrosis or hydroureter. Bladder wall is not thickened and no bladder filling defects are demonstrated. Stomach/Bowel: Stomach is decompressed. Small bowel are mostly decompressed. No small bowel wall thickening. Scattered stool throughout the colon. Diverticulosis of the sigmoid colon. No inflammatory changes. No colonic wall thickening. Surgical clips adjacent to  the transverse colon. Appendix is not identified. Vascular/Lymphatic: Diffuse aortic calcification with calcification of major branch vessels. IVC is unremarkable. Retroperitoneal lymph nodes are not pathologically enlarged. Peritoneal nodularity seen previously is less prominent today. Pelvic lymph nodes are mildly enlarged without change since prior study. Reproductive: Status post hysterectomy. No adnexal masses. Other: No free air or free fluid in the abdomen. Abdominal wall musculature appears intact. Musculoskeletal: Diffuse lytic and sclerotic metastatic disease demonstrated throughout the skeletal structures, including multiple vertebrae, sacrum, pelvis, and hips. IMPRESSION: 1. Changes consistent with metastatic disease as seen previously, including multiple bilateral pulmonary nodules, pleural thickening, prominent pelvic lymph nodes, and diffuse bone metastasis. Peritoneal implants and retroperitoneal lymph nodes are decreased in comparison to previous study. 2. No evidence of bowel obstruction or inflammation. Diverticulosis of the sigmoid colon without diverticulitis. 3. Aortic atherosclerosis. Electronically Signed   By: Lucienne Capers M.D.   On: 09/24/2016 02:48

## 2016-09-24 NOTE — ED Notes (Signed)
md notified of pt's wish to be discharged, discharge instructions printed previously by md. Instructions and belongings placed in a bag for pt to take home. Discharge instructions reviewed with pt who verbalizes understanding.

## 2016-09-24 NOTE — ED Triage Notes (Signed)
Pt to ED from home via [facility] with c/o diarrhea x2 days. Pt was seen and admitted here on Wed/Thur and scheduled for placement at [facility] today. EMS reports pt was concerned about not "being able to clean and care for herself at home". Pt is A&O, in NAD, with respirations even, regular, and unlabored. Dr Mable Paris at bedside upon pt's arrival to ED.

## 2016-09-24 NOTE — ED Notes (Signed)
Pt states she would like to go home. Pt states she has a home health aide coming at 0800 today to stay with her all day.

## 2016-09-25 NOTE — ED Provider Notes (Signed)
[facility] Emergency Department Provider Note   ____________________________________________   I have reviewed the triage vital signs and the nursing notes.   HISTORY  Chief Complaint Diarrhea and Weakness   History limited by: Not Limited   HPI Jeanette White is a 83 y.o. female who presents to the emergency department today because of concerns for diarrhea and weakness. The patient states that she has had diarrhea for 2 days. This has been accompanied by some nausea and abdominal pain. In addition the patient has felt increasingly weak over the past 2 days. Patient denies any fevers.   Past Medical History:  Diagnosis Date  . Anginal pain (HCC)    STABLE  . Arthritis   . Asthma    CHRONIC OBSTRUCTIVE  . B12 deficiency   . Complication of anesthesia    SLOW TO WAKE UP  . Coronary artery disease   . Depression    MAJOR  . Diabetes mellitus without complication ([place])   . Edema    HANDS/FEET  . GERD (gastroesophageal reflux disease)   . HOH (hard of hearing)   . Hypertension   . Myocardial infarction ([place])   . Peripheral vascular disease ([place])   . Polyneuropathy   . Renal anomaly    ARTERY STENOSIS  . Rhinitis, allergic    CHRONIC  . Shoulder disorder    ROTATOR CUFF TEAR  LEFT  . Sleep apnea   . Spinal disorder    STENOSIS    Patient Active Problem List   Diagnosis Date Noted  . Weakness 09/22/2016  . Slurred speech 06/17/2016  . Hypercalcemia 05/07/2016  . Pulmonary nodules/lesions, multiple 05/07/2016    Past Surgical History:  Procedure Laterality Date  . APPENDECTOMY    . CATARACT EXTRACTION W/PHACO Left 12/09/2015   Procedure: CATARACT EXTRACTION PHACO AND INTRAOCULAR LENS PLACEMENT (IOC);  Surgeon: Estill Cotta, MD;  Location: [facility] ORS;  Service: Ophthalmology;  Laterality: Left;  [place] 01:21 AP% 22.8 CDE 31.49 fluid pack lot # [id number]  . CHOLECYSTECTOMY    . CORONARY ANGIOPLASTY     STENT  . CORONARY ARTERY  BYPASS GRAFT    . TOTAL KNEE ARTHROPLASTY Bilateral     Prior to Admission medications   Medication Sig Start Date End Date Taking? Authorizing Provider  acetaminophen (TYLENOL) 500 MG tablet Take 1,000 mg by mouth every 8 (eight) hours as needed for mild pain or moderate pain.   Yes [provider]  atropine 1 % ophthalmic solution Place 2 drops under the tongue every hour as needed (secretions).   Yes [provider]  busPIRone (BUSPAR) 5 MG tablet Take 5 mg by mouth 2 (two) times daily.   Yes [provider]  clopidogrel (PLAVIX) 75 MG tablet Take 75 mg by mouth daily.   Yes [provider]  FLUoxetine (PROZAC) 40 MG capsule Take 40 mg by mouth daily.   Yes [provider]  fluticasone (FLONASE) 50 MCG/ACT nasal spray Place 1 spray into both nostrils daily.   Yes [provider]  hydrocortisone cream 0.5 % Apply 1 application topically 2 (two) times daily as needed for itching.   Yes [provider]  ipratropium-albuterol (DUONEB) 0.5-2.5 (3) MG/3ML SOLN Take 3 mLs by nebulization every 6 (six) hours. 05/11/16  Yes Wieting, Richard, MD  letrozole [facility]) 2.5 MG tablet Take 2.5 mg by mouth daily.   Yes [provider]  lidocaine (LIDODERM) 5 % Place 1 patch onto the skin daily. Remove & Discard  patch within 12 hours or as directed by MD   Yes [provider]  LORazepam (ATIVAN) 2 MG/ML concentrated solution Take 0.5 mg by mouth every 2 (two) hours as needed for anxiety or sedation.   Yes [provider]  meloxicam (MOBIC) 7.5 MG tablet Take 7.5 mg by mouth daily.   Yes [provider]  Menthol-Zinc Oxide (CALMOSEPTINE) 0.44-20.625 % OINT Apply 1 application topically as needed.   Yes [provider]  morphine (ROXANOL) 20 MG/ML concentrated solution Take 4 mg by mouth every 2 (two) hours as needed for severe pain.   Yes [provider]  nitroGLYCERIN (NITROSTAT) 0.4 MG SL tablet  Place 0.4 mg under the tongue every 5 (five) minutes as needed for chest pain.   Yes [provider]  nystatin (MYCOSTATIN/NYSTOP) powder Apply topically 3 (three) times daily. 05/11/16  Yes Wieting, Richard, MD  pantoprazole (PROTONIX) 40 MG tablet Take 40 mg by mouth daily.    Yes [provider]  potassium chloride (K-DUR) 10 MEQ tablet Take 10-20 mEq by mouth 2 (two) times daily. 2 tablets in the morning and 1 tablet at bedtime   Yes [provider]  pregabalin (LYRICA) 150 MG capsule Take 150 mg by mouth at bedtime.   Yes [provider]  cephALEXin (KEFLEX) 500 MG capsule Take 1 capsule (500 mg total) by mouth every 12 (twelve) hours. 09/22/16   Fritzi Mandes, MD  furosemide (LASIX) 20 MG tablet Take 1 tablet (20 mg total) by mouth daily. 09/24/16   Fritzi Mandes, MD  valsartan-hydrochlorothiazide (DIOVAN-HCT) 80-12.5 MG tablet Take 1 tablet by mouth daily. Pt advised to start from 09/24/16 09/24/16   Fritzi Mandes, MD    Allergies Ace inhibitors and Sulfa antibiotics  Family History  Problem Relation Age of Onset  . Alzheimer's disease Sister   . Arthritis Sister     Social History Social History  Substance Use Topics  . Smoking status: Never Smoker  . Smokeless tobacco: Never Used  . Alcohol use No    Review of Systems Constitutional: No fever/chills Eyes: No visual changes. ENT: No sore throat. Cardiovascular: Denies chest pain. Respiratory: Denies shortness of breath. Gastrointestinal: Positive for abdominal pain, nausea and diarrhea. Genitourinary: Negative for dysuria. Musculoskeletal: Negative for back pain. Skin: Negative for rash. Neurological: Negative for headaches, focal weakness or numbness.  ____________________________________________   PHYSICAL EXAM:  VITAL SIGNS: ED Triage Vitals  Enc Vitals Group     BP 09/21/16 2142 123/81     Pulse Rate 09/21/16 2142 66     Resp 09/21/16 2142 16     Temp 09/21/16 2142 100.1 F (37.8  C)     Temp Source 09/21/16 2142 Rectal     SpO2 09/21/16 2142 98 %     Weight 09/21/16 2142 191 lb (86.6 kg)     Height 09/21/16 2142 5\' 3"  (1.6 m)     Head Circumference --      Peak Flow --      Pain Score 09/21/16 2141 9   Constitutional: Alert and oriented. Well appearing and in no distress. Eyes: Conjunctivae are normal. Normal extraocular movements. ENT   Head: Normocephalic and atraumatic.   Nose: No congestion/rhinnorhea.   Mouth/Throat: Mucous membranes are moist.   Neck: No stridor. Hematological/Lymphatic/Immunilogical: No cervical lymphadenopathy. Cardiovascular: Normal rate, regular rhythm.  No murmurs, rubs, or gallops.  Respiratory: Normal respiratory effort without tachypnea nor retractions. Breath sounds are clear and equal bilaterally. No wheezes/rales/rhonchi. Gastrointestinal: Soft and  non tender. No rebound. No guarding.  Genitourinary: Deferred Musculoskeletal: Normal range of motion in all extremities. No lower extremity edema. Neurologic:  Normal speech and language. No gross focal neurologic deficits are appreciated.  Skin:  Skin is warm, dry and intact. No rash noted. Psychiatric: Mood and affect are normal. Speech and behavior are normal. Patient exhibits appropriate insight and judgment.  ____________________________________________    LABS (pertinent positives/negatives)  UA  Nitrite Positive Leukocytes Large WBC Too numerous to be seen  ____________________________________________   EKG  None  ____________________________________________    RADIOLOGY  None   ____________________________________________   PROCEDURES  Procedures  ____________________________________________   INITIAL IMPRESSION / ASSESSMENT AND PLAN / ED COURSE  Pertinent labs & imaging results that were available during my care of the patient were reviewed by me and considered in my medical decision making (see chart for details).  Workup  here in the emergency department consistent with urinary tract infection. Will plan on giving him antibiotics and admission to hospital service.  ____________________________________________   FINAL CLINICAL IMPRESSION(S) / ED DIAGNOSES  Final diagnoses:  Lower urinary tract infectious disease  Generalized weakness  Diarrhea, unspecified type     Note: This dictation was prepared with Dragon dictation. Any transcriptional errors that result from this process are unintentional     Nance Pear, MD 09/25/16 2237

## 2017-01-13 ENCOUNTER — Inpatient Hospital Stay
Admission: EM | Admit: 2017-01-13 | Discharge: 2017-01-17 | DRG: 378 | Disposition: A | Discharge status: Home / Self Care | Payer: Medicare (Managed Care) | Attending: Internal Medicine | Admitting: Internal Medicine

## 2017-01-13 ENCOUNTER — Encounter: Payer: Self-pay | Admitting: Emergency Medicine

## 2017-01-13 DIAGNOSIS — Z881 Allergy status to other antibiotic agents status: Secondary | ICD-10-CM

## 2017-01-13 DIAGNOSIS — Z9049 Acquired absence of other specified parts of digestive tract: Secondary | ICD-10-CM | POA: Diagnosis not present

## 2017-01-13 DIAGNOSIS — Z961 Presence of intraocular lens: Secondary | ICD-10-CM | POA: Diagnosis present

## 2017-01-13 DIAGNOSIS — K219 Gastro-esophageal reflux disease without esophagitis: Secondary | ICD-10-CM | POA: Diagnosis present

## 2017-01-13 DIAGNOSIS — C50919 Malignant neoplasm of unspecified site of unspecified female breast: Secondary | ICD-10-CM | POA: Diagnosis present

## 2017-01-13 DIAGNOSIS — I252 Old myocardial infarction: Secondary | ICD-10-CM | POA: Diagnosis not present

## 2017-01-13 DIAGNOSIS — E876 Hypokalemia: Secondary | ICD-10-CM | POA: Diagnosis not present

## 2017-01-13 DIAGNOSIS — Z96653 Presence of artificial knee joint, bilateral: Secondary | ICD-10-CM | POA: Diagnosis present

## 2017-01-13 DIAGNOSIS — F329 Major depressive disorder, single episode, unspecified: Secondary | ICD-10-CM | POA: Diagnosis present

## 2017-01-13 DIAGNOSIS — K922 Gastrointestinal hemorrhage, unspecified: Secondary | ICD-10-CM | POA: Diagnosis present

## 2017-01-13 DIAGNOSIS — E1142 Type 2 diabetes mellitus with diabetic polyneuropathy: Secondary | ICD-10-CM | POA: Diagnosis present

## 2017-01-13 DIAGNOSIS — Z79899 Other long term (current) drug therapy: Secondary | ICD-10-CM

## 2017-01-13 DIAGNOSIS — Z66 Do not resuscitate: Secondary | ICD-10-CM | POA: Diagnosis present

## 2017-01-13 DIAGNOSIS — C7800 Secondary malignant neoplasm of unspecified lung: Secondary | ICD-10-CM | POA: Diagnosis present

## 2017-01-13 DIAGNOSIS — C7951 Secondary malignant neoplasm of bone: Secondary | ICD-10-CM | POA: Diagnosis present

## 2017-01-13 DIAGNOSIS — J449 Chronic obstructive pulmonary disease, unspecified: Secondary | ICD-10-CM | POA: Diagnosis present

## 2017-01-13 DIAGNOSIS — Z853 Personal history of malignant neoplasm of breast: Secondary | ICD-10-CM | POA: Diagnosis not present

## 2017-01-13 DIAGNOSIS — J310 Chronic rhinitis: Secondary | ICD-10-CM | POA: Diagnosis present

## 2017-01-13 DIAGNOSIS — I10 Essential (primary) hypertension: Secondary | ICD-10-CM | POA: Diagnosis present

## 2017-01-13 DIAGNOSIS — H9190 Unspecified hearing loss, unspecified ear: Secondary | ICD-10-CM | POA: Diagnosis present

## 2017-01-13 DIAGNOSIS — E785 Hyperlipidemia, unspecified: Secondary | ICD-10-CM | POA: Diagnosis present

## 2017-01-13 DIAGNOSIS — K921 Melena: Secondary | ICD-10-CM | POA: Diagnosis present

## 2017-01-13 DIAGNOSIS — Z7902 Long term (current) use of antithrombotics/antiplatelets: Secondary | ICD-10-CM | POA: Diagnosis not present

## 2017-01-13 DIAGNOSIS — Z955 Presence of coronary angioplasty implant and graft: Secondary | ICD-10-CM | POA: Diagnosis not present

## 2017-01-13 DIAGNOSIS — I2510 Atherosclerotic heart disease of native coronary artery without angina pectoris: Secondary | ICD-10-CM | POA: Diagnosis present

## 2017-01-13 DIAGNOSIS — Z951 Presence of aortocoronary bypass graft: Secondary | ICD-10-CM | POA: Diagnosis not present

## 2017-01-13 DIAGNOSIS — Z888 Allergy status to other drugs, medicaments and biological substances status: Secondary | ICD-10-CM

## 2017-01-13 DIAGNOSIS — Z79891 Long term (current) use of opiate analgesic: Secondary | ICD-10-CM

## 2017-01-13 DIAGNOSIS — E1151 Type 2 diabetes mellitus with diabetic peripheral angiopathy without gangrene: Secondary | ICD-10-CM | POA: Diagnosis present

## 2017-01-13 HISTORY — DX: Malignant neoplasm of unspecified site of unspecified female breast: C50.919

## 2017-01-13 LAB — COMPREHENSIVE METABOLIC PANEL
ALT: 12 U/L — AB (ref 14–54)
AST: 22 U/L (ref 15–41)
Albumin: 3.1 g/dL — ABNORMAL LOW (ref 3.5–5.0)
Alkaline Phosphatase: 79 U/L (ref 38–126)
Anion gap: 9 (ref 5–15)
BILIRUBIN TOTAL: 0.8 mg/dL (ref 0.3–1.2)
BUN: 39 mg/dL — AB (ref 6–20)
CALCIUM: 8.6 mg/dL — AB (ref 8.9–10.3)
CO2: 26 mmol/L (ref 22–32)
CREATININE: 0.62 mg/dL (ref 0.44–1.00)
Chloride: 104 mmol/L (ref 101–111)
GFR calc Af Amer: >60 mL/min (ref >60)
Glucose, Bld: 134 mg/dL — ABNORMAL HIGH (ref 65–99)
Potassium: 4.2 mmol/L (ref 3.5–5.1)
Sodium: 139 mmol/L (ref 135–145)
Total Protein: 6.1 g/dL — ABNORMAL LOW (ref 6.5–8.1)

## 2017-01-13 LAB — ABO/RH: ABO/RH(D): O NEG

## 2017-01-13 LAB — PROTIME-INR
INR: 1.08
Prothrombin Time: 13.9 seconds (ref 11.4–15.2)

## 2017-01-13 LAB — CBC
HCT: 21.8 % — ABNORMAL LOW (ref 35.0–47.0)
Hemoglobin: 7.4 g/dL — ABNORMAL LOW (ref 12.0–16.0)
MCH: 30.2 pg (ref 26.0–34.0)
MCHC: 34 g/dL (ref 32.0–36.0)
MCV: 88.7 fL (ref 80.0–100.0)
PLATELETS: 277 10*3/uL (ref 150–440)
RBC: 2.46 MIL/uL — ABNORMAL LOW (ref 3.80–5.20)
RDW: 14.9 % — AB (ref 11.5–14.5)
WBC: 9.5 10*3/uL (ref 3.6–11.0)

## 2017-01-13 LAB — PREPARE RBC (CROSSMATCH)

## 2017-01-13 LAB — APTT: aPTT: 27 seconds (ref 24–36)

## 2017-01-13 MED ORDER — ATROPINE SULFATE 1 % OP SOLN
2.0000 [drp] | OPHTHALMIC | Status: DC | PRN
  Filled 2017-01-13: qty 2

## 2017-01-13 MED ORDER — MORPHINE SULFATE (CONCENTRATE) 10 MG/0.5ML PO SOLN: 4.0000 mg | ORAL | Status: DC | PRN

## 2017-01-13 MED ORDER — IPRATROPIUM-ALBUTEROL 0.5-2.5 (3) MG/3ML IN SOLN: 3.0000 mL | Freq: Four times a day (QID) | RESPIRATORY_TRACT | Status: DC

## 2017-01-13 MED ORDER — ACETAMINOPHEN 650 MG RE SUPP
650.0000 mg | Freq: Four times a day (QID) | RECTAL | Status: DC | PRN
Start: 2017-01-13 — End: 2017-01-17

## 2017-01-13 MED ORDER — ONDANSETRON HCL 4 MG PO TABS: 4.0000 mg | ORAL_TABLET | Freq: Four times a day (QID) | ORAL | Status: DC | PRN

## 2017-01-13 MED ORDER — LORAZEPAM 2 MG/ML PO CONC
0.5000 mg | ORAL | Status: DC | PRN
  Administered 2017-01-16: 0.5 mg via ORAL
  Filled 2017-01-13: qty 1

## 2017-01-13 MED ORDER — ONDANSETRON 4 MG PO TBDP: 4.0000 mg | ORAL_TABLET | Freq: Three times a day (TID) | ORAL | Status: DC | PRN

## 2017-01-13 MED ORDER — PANTOPRAZOLE SODIUM 40 MG PO TBEC: 40.0000 mg | DELAYED_RELEASE_TABLET | Freq: Every day | ORAL | Status: DC

## 2017-01-13 MED ORDER — PREGABALIN 75 MG PO CAPS
150.0000 mg | ORAL_CAPSULE | Freq: Every day | ORAL | Status: DC
Start: 2017-01-14 — End: 2017-01-17
  Administered 2017-01-14 – 2017-01-17 (×4): 150 mg via ORAL
  Filled 2017-01-13 (×4): qty 2

## 2017-01-13 MED ORDER — TRAMADOL HCL 50 MG PO TABS
50.0000 mg | ORAL_TABLET | Freq: Four times a day (QID) | ORAL | Status: DC | PRN
  Administered 2017-01-16: 50 mg via ORAL
  Filled 2017-01-13: qty 1

## 2017-01-13 MED ORDER — ACETAMINOPHEN 500 MG PO TABS
1000.0000 mg | ORAL_TABLET | Freq: Three times a day (TID) | ORAL | Status: DC | PRN
Start: 2017-01-13 — End: 2017-01-13

## 2017-01-13 MED ORDER — IPRATROPIUM-ALBUTEROL 0.5-2.5 (3) MG/3ML IN SOLN: 3.0000 mL | Freq: Four times a day (QID) | RESPIRATORY_TRACT | Status: DC | PRN

## 2017-01-13 MED ORDER — ACETAMINOPHEN 325 MG PO TABS: 650.0000 mg | ORAL_TABLET | Freq: Four times a day (QID) | ORAL | Status: DC | PRN

## 2017-01-13 MED ORDER — LETROZOLE 2.5 MG PO TABS
2.5000 mg | ORAL_TABLET | Freq: Every day | ORAL | Status: DC
  Administered 2017-01-14 – 2017-01-17 (×4): 2.5 mg via ORAL
  Filled 2017-01-13 (×4): qty 1

## 2017-01-13 MED ORDER — FLUOXETINE HCL 20 MG PO CAPS
40.0000 mg | ORAL_CAPSULE | Freq: Every day | ORAL | Status: DC
  Administered 2017-01-14 – 2017-01-17 (×4): 40 mg via ORAL
  Filled 2017-01-13 (×4): qty 2

## 2017-01-13 MED ORDER — SODIUM CHLORIDE 0.9 % IV SOLN
INTRAVENOUS | Status: AC
  Administered 2017-01-13: 19:00:00 via INTRAVENOUS
  Administered 2017-01-14: 75 mL/h via INTRAVENOUS

## 2017-01-13 MED ORDER — VALSARTAN-HYDROCHLOROTHIAZIDE 80-12.5 MG PO TABS: 1.0000 | ORAL_TABLET | Freq: Every day | ORAL | Status: DC

## 2017-01-13 MED ORDER — ONDANSETRON HCL 4 MG/2ML IJ SOLN
4.0000 mg | Freq: Four times a day (QID) | INTRAMUSCULAR | Status: DC | PRN
  Administered 2017-01-15: 06:00:00 4 mg via INTRAVENOUS
  Filled 2017-01-13: qty 2

## 2017-01-13 MED ORDER — NITROGLYCERIN 0.4 MG SL SUBL: 0.4000 mg | SUBLINGUAL_TABLET | SUBLINGUAL | Status: DC | PRN

## 2017-01-13 MED ORDER — IPRATROPIUM-ALBUTEROL 0.5-2.5 (3) MG/3ML IN SOLN
3.0000 mL | Freq: Four times a day (QID) | RESPIRATORY_TRACT | Status: DC
  Administered 2017-01-13: 3 mL via RESPIRATORY_TRACT
  Filled 2017-01-13: qty 3

## 2017-01-13 MED ORDER — PANTOPRAZOLE SODIUM 40 MG IV SOLR
40.0000 mg | Freq: Two times a day (BID) | INTRAVENOUS | Status: DC
  Administered 2017-01-13 – 2017-01-17 (×8): 40 mg via INTRAVENOUS
  Filled 2017-01-13 (×8): qty 40

## 2017-01-13 MED ORDER — OXYCODONE HCL 5 MG PO TABS: 5.0000 mg | ORAL_TABLET | ORAL | Status: DC | PRN

## 2017-01-13 MED ORDER — LIDOCAINE 5 % EX PTCH
1.0000 | MEDICATED_PATCH | CUTANEOUS | Status: DC
  Administered 2017-01-14 – 2017-01-17 (×4): 1 via TRANSDERMAL
  Filled 2017-01-13 (×4): qty 1

## 2017-01-13 MED ORDER — POTASSIUM CHLORIDE CRYS ER 10 MEQ PO TBCR
10.0000 meq | EXTENDED_RELEASE_TABLET | Freq: Two times a day (BID) | ORAL | Status: DC
  Administered 2017-01-13 – 2017-01-17 (×8): 10 meq via ORAL
  Filled 2017-01-13 (×13): qty 1

## 2017-01-13 MED ORDER — ZINC OXIDE 40 % EX OINT
1.0000 "application " | TOPICAL_OINTMENT | CUTANEOUS | Status: DC | PRN
  Filled 2017-01-13: qty 114

## 2017-01-13 MED ORDER — SODIUM CHLORIDE 0.9 % IV SOLN: 10.0000 mL/h | Freq: Once | INTRAVENOUS | Status: DC

## 2017-01-13 MED ORDER — FUROSEMIDE 20 MG PO TABS
20.0000 mg | ORAL_TABLET | Freq: Every day | ORAL | Status: DC
  Administered 2017-01-14 – 2017-01-17 (×4): 20 mg via ORAL
  Filled 2017-01-13 (×4): qty 1

## 2017-01-13 MED ORDER — BUSPIRONE HCL 5 MG PO TABS
5.0000 mg | ORAL_TABLET | Freq: Two times a day (BID) | ORAL | Status: DC
  Administered 2017-01-13 – 2017-01-17 (×8): 5 mg via ORAL
  Filled 2017-01-13 (×9): qty 1

## 2017-01-13 NOTE — ED Provider Notes (Signed)
[facility] Emergency Department Provider Note   ____________________________________________    I have reviewed the triage vital signs and the nursing notes.   HISTORY  Chief Complaint Rectal Bleeding     HPI Jeanette White is a 83 y.o. female who presents with rectal bleeding.patient was found to have significant blood in her stool today. She does have a history of breast cancer and diabetes. She denies dizziness. Reportedly had a hemoglobin of 10.4 1 week ago.Not on blood thinners.no history of GI bleed reported. Patient is part of the pace program. She does not want significant interventions performed but would be willing to have a blood transfusion   Past Medical History:  Diagnosis Date  . Anginal pain (HCC)    STABLE  . Arthritis   . Asthma    CHRONIC OBSTRUCTIVE  . B12 deficiency   . Breast cancer ([place])    has metastisized  . Complication of anesthesia    SLOW TO WAKE UP  . Coronary artery disease   . Depression    MAJOR  . Diabetes mellitus without complication ([place])   . Edema    HANDS/FEET  . GERD (gastroesophageal reflux disease)   . HOH (hard of hearing)   . Hypertension   . Myocardial infarction ([place])   . Peripheral vascular disease ([place])   . Polyneuropathy   . Renal anomaly    ARTERY STENOSIS  . Rhinitis, allergic    CHRONIC  . Shoulder disorder    ROTATOR CUFF TEAR  LEFT  . Sleep apnea   . Spinal disorder    STENOSIS    Patient Active Problem List   Diagnosis Date Noted  . Weakness 09/22/2016  . Slurred speech 06/17/2016  . Hypercalcemia 05/07/2016  . Pulmonary nodules/lesions, multiple 05/07/2016    Past Surgical History:  Procedure Laterality Date  . APPENDECTOMY    . CATARACT EXTRACTION W/PHACO Left 12/09/2015   Procedure: CATARACT EXTRACTION PHACO AND INTRAOCULAR LENS PLACEMENT (IOC);  Surgeon: Estill Cotta, MD;  Location: [facility] ORS;  Service: Ophthalmology;  Laterality: Left;  [place] 01:21 AP%  22.8 CDE 31.49 fluid pack lot # [id number]  . CHOLECYSTECTOMY    . CORONARY ANGIOPLASTY     STENT  . CORONARY ARTERY BYPASS GRAFT    . TOTAL KNEE ARTHROPLASTY Bilateral     Prior to Admission medications   Medication Sig Start Date End Date Taking? Authorizing Provider  acetaminophen (TYLENOL) 500 MG tablet Take 1,000 mg by mouth every 8 (eight) hours as needed for mild pain or moderate pain.   Yes [provider]  atropine 1 % ophthalmic solution Place 2 drops under the tongue every hour as needed (secretions).   Yes [provider]  busPIRone (BUSPAR) 5 MG tablet Take 5 mg by mouth 2 (two) times daily.   Yes [provider]  clopidogrel (PLAVIX) 75 MG tablet Take 75 mg by mouth daily.   Yes [provider]  FLUoxetine (PROZAC) 40 MG capsule Take 40 mg by mouth daily.   Yes [provider]  furosemide (LASIX) 20 MG tablet Take 1 tablet (20 mg total) by mouth daily. 09/24/16  Yes Fritzi Mandes, MD  ipratropium-albuterol (DUONEB) 0.5-2.5 (3) MG/3ML SOLN Take 3 mLs by nebulization every 6 (six) hours. 05/11/16  Yes Wieting, Richard, MD  letrozole [facility]) 2.5 MG tablet Take 2.5 mg by mouth daily.   Yes [provider]  lidocaine (LIDODERM) 5 % Place 1 patch onto the skin daily. Remove &  Discard patch within 12 hours or as directed by MD   Yes [provider]  loperamide (IMODIUM) 2 MG capsule Take 4 mg by mouth as needed for diarrhea or loose stools.   Yes [provider]  LORazepam (ATIVAN) 2 MG/ML concentrated solution Take 0.5 mg by mouth every 2 (two) hours as needed for anxiety or sedation.   Yes [provider]  meloxicam (MOBIC) 15 MG tablet Take 7.5 mg by mouth daily.   Yes [provider]  Menthol-Zinc Oxide (CALMOSEPTINE) 0.44-20.625 % OINT Apply 1 application topically as needed.   Yes [provider]  morphine (ROXANOL) 20 MG/ML concentrated solution Take 4 mg by mouth every 2 (two) hours  as needed for severe pain.   Yes [provider]  nitroGLYCERIN (NITROSTAT) 0.4 MG SL tablet Place 0.4 mg under the tongue every 5 (five) minutes as needed for chest pain.   Yes [provider]  nystatin (MYCOSTATIN/NYSTOP) powder Apply topically 3 (three) times daily. 05/11/16  Yes Wieting, Richard, MD  ondansetron (ZOFRAN-ODT) 4 MG disintegrating tablet Take 4 mg by mouth every 8 (eight) hours as needed for nausea or vomiting.   Yes [provider]  potassium chloride (K-DUR) 10 MEQ tablet Take 10-20 mEq by mouth 2 (two) times daily. 2 tablets in the morning and 1 tablet at bedtime   Yes [provider]  pregabalin (LYRICA) 150 MG capsule Take 150 mg by mouth at bedtime.   Yes [provider]  traMADol (ULTRAM) 50 MG tablet Take 50 mg by mouth every 6 (six) hours as needed.   Yes [provider]  cephALEXin (KEFLEX) 500 MG capsule Take 1 capsule (500 mg total) by mouth every 12 (twelve) hours. Patient not taking: Reported on 01/13/2017 09/22/16   Fritzi Mandes, MD  pantoprazole (PROTONIX) 40 MG tablet Take 40 mg by mouth daily.     [provider]  valsartan-hydrochlorothiazide (DIOVAN-HCT) 80-12.5 MG tablet Take 1 tablet by mouth daily. Pt advised to start from 09/24/16 Patient not taking: Reported on 01/13/2017 09/24/16   Fritzi Mandes, MD     Allergies Ace inhibitors and Sulfa antibiotics  Family History  Problem Relation Age of Onset  . Alzheimer's disease Sister   . Arthritis Sister     Social History Social History  Substance Use Topics  . Smoking status: Never Smoker  . Smokeless tobacco: Never Used  . Alcohol use No    Review of Systems  Constitutional: No fever/chills Eyes: No visual changes.  ENT: No sore throat. Cardiovascular: Denies chest pain. Respiratory: Denies shortness of breath. Gastrointestinal: mild lower abdominal  Genitourinary: Negative for dysuria. Musculoskeletal: Negative for back pain. Skin:  Negative for rash. Neurological: Negative for headaches    ____________________________________________   PHYSICAL EXAM:  VITAL SIGNS: ED Triage Vitals  Enc Vitals Group     BP 01/13/17 1234 (!) 130/53     Pulse Rate 01/13/17 1234 82     Resp 01/13/17 1234 18     Temp 01/13/17 1234 98 F (36.7 C)     Temp Source 01/13/17 1234 Oral     SpO2 01/13/17 1234 97 %     Weight 01/13/17 1234 94.3 kg (208 lb)     Height 01/13/17 1234 1.549 m (5\' 1" )     Head Circumference --      Peak Flow --      Pain Score 01/13/17 1233 8     Pain Loc --      Pain  Edu? --      Excl. in [place]? --     Constitutional: Alert and oriented. No acute distress.  Eyes: Conjunctivae are normal.   Nose: No congestion/rhinnorhea. Mouth/Throat: Mucous membranes are moist.    Cardiovascular: Normal rate, regular rhythm. Grossly normal heart sounds.  Good peripheral circulation. Respiratory: Normal respiratory effort.  No retractions. Lungs CTAB. Gastrointestinal: Soft and nontender. No distention.  No CVA tenderness.melanotic stool Genitourinary: deferred Musculoskeletal:.  Warm and well perfused Neurologic:  Normal speech and language. No gross focal neurologic deficits are appreciated.  Skin:  Skin is warm, dry and intact. No rash noted. Psychiatric: Mood and affect are normal. Speech and behavior are normal.  ____________________________________________   LABS (all labs ordered are listed, but only abnormal results are displayed)  Labs Reviewed  COMPREHENSIVE METABOLIC PANEL - Abnormal; Notable for the following:       Result Value   Glucose, Bld 134 (*)    BUN 39 (*)    Calcium 8.6 (*)    Total Protein 6.1 (*)    Albumin 3.1 (*)    ALT 12 (*)    All other components within normal limits  CBC - Abnormal; Notable for the following:    RBC 2.46 (*)    Hemoglobin 7.4 (*)    HCT 21.8 (*)    RDW 14.9 (*)    All other components within normal limits  APTT  PROTIME-INR  POC OCCULT BLOOD, ED    TYPE AND SCREEN  PREPARE RBC (CROSSMATCH)  ABO/RH   ____________________________________________  EKG  None ____________________________________________  RADIOLOGY  None ____________________________________________   PROCEDURES  Procedure(s) performed: No    Critical Care performed: yes  CRITICAL CARE Performed by: Lavonia Drafts   Total critical care time: 30 minutes  Critical care time was exclusive of separately billable procedures and treating other patients.  Critical care was necessary to treat or prevent imminent or life-threatening deterioration.  Critical care was time spent personally by me on the following activities: development of treatment plan with patient and/or surrogate as well as nursing, discussions with consultants, evaluation of patient's response to treatment, examination of patient, obtaining history from patient or surrogate, ordering and performing treatments and interventions, ordering and review of laboratory studies, ordering and review of radiographic studies, pulse oximetry and re-evaluation of patient's condition.   ____________________________________________   INITIAL IMPRESSION / ASSESSMENT AND PLAN / ED COURSE  Pertinent labs & imaging results that were available during my care of the patient were reviewed by me and considered in my medical decision making (see chart for details).  Patient presents with GI bleed, stool is melanotic. Vitals are stable however hemoglobin 7.4. I will give 2 units PRBC and admit to the medicine service    ____________________________________________   FINAL CLINICAL IMPRESSION(S) / ED DIAGNOSES  Final diagnoses:  Gastrointestinal hemorrhage, unspecified gastrointestinal hemorrhage type      NEW MEDICATIONS STARTED DURING THIS VISIT:  New Prescriptions   No medications on file     Note:  This document was prepared using Dragon voice recognition software and may include unintentional  dictation errors.    Lavonia Drafts, MD 01/13/17 [phone]

## 2017-01-13 NOTE — ED Triage Notes (Addendum)
Pt arrived with dr Coralie Common from [place].  Pt has had bright red rectal bleeding. Pt reports vomiting but no blood in emesis. Appears pale.  Skin warm and dry.  Respirations unlabored.  Has had LLQ abdominal pain

## 2017-01-13 NOTE — Progress Notes (Signed)
Pnt resting no complaints of pain or discomfort. Unit 2 of 2  PRBC started, pre VS's and 15 min post start of blood stable and WNL. Reviewed s/sx of reaction, pnt verbalized understanding. Call bell placed in reach and reviewed how to call for help. No  S/sx of reaction at this time. Will continue to monitor and assess.

## 2017-01-13 NOTE — ED Notes (Signed)
Turned and changed.  Incontinent for moderate amount deep burgundy stool.  Skin care given and linen changed.  Patient tolerated well.

## 2017-01-13 NOTE — H&P (Signed)
[place] [facility] [place] NAME: Jeanette White    MR#:  [id number]  DATE OF BIRTH:  1927/12/05  DATE OF ADMISSION:  01/13/2017  PRIMARY CARE PHYSICIAN: Gareth Morgan, MD   REQUESTING/REFERRING PHYSICIAN:   CHIEF COMPLAINT:   Dark stool HISTORY OF PRESENT ILLNESS:  Jeanette White  is a 83 y.o. female with a known history of essential hypertension, diabetes, hyperlipidemia and multiple other medical problems as sent over to the hospital for black tarry stool which was started from yesterday evening. Patient reports epigastric abdominal pain. Had one episode of vomiting but no other episodes afterwards. Patient could not recall if she had any EGD or colonoscopy done in the past. She is taking Plavix and Mobic at home. Hemoglobin dropped from 11.2 in May 2018 to 7.4 today. The physician has ordered 2 units of blood. Patient is aware that GI coverage starts from 7 PM tonight  PAST MEDICAL HISTORY:   Past Medical History:  Diagnosis Date  . Anginal pain (HCC)    STABLE  . Arthritis   . Asthma    CHRONIC OBSTRUCTIVE  . B12 deficiency   . Breast cancer ([place])    has metastisized  . Complication of anesthesia    SLOW TO WAKE UP  . Coronary artery disease   . Depression    MAJOR  . Diabetes mellitus without complication ([place])   . Edema    HANDS/FEET  . GERD (gastroesophageal reflux disease)   . HOH (hard of hearing)   . Hypertension   . Myocardial infarction ([place])   . Peripheral vascular disease ([place])   . Polyneuropathy   . Renal anomaly    ARTERY STENOSIS  . Rhinitis, allergic    CHRONIC  . Shoulder disorder    ROTATOR CUFF TEAR  LEFT  . Sleep apnea   . Spinal disorder    STENOSIS    PAST SURGICAL HISTOIRY:   Past Surgical History:  Procedure Laterality Date  . APPENDECTOMY    . CATARACT EXTRACTION W/PHACO Left 12/09/2015   Procedure: CATARACT EXTRACTION PHACO AND INTRAOCULAR LENS PLACEMENT (IOC);  Surgeon: Estill Cotta, MD;  Location: [facility] ORS;  Service: Ophthalmology;  Laterality: Left;  [place] 01:21 AP% 22.8 CDE 31.49 fluid pack lot # [id number]  . CHOLECYSTECTOMY    . CORONARY ANGIOPLASTY     STENT  . CORONARY ARTERY BYPASS GRAFT    . TOTAL KNEE ARTHROPLASTY Bilateral     SOCIAL HISTORY:   Social History  Substance Use Topics  . Smoking status: Never Smoker  . Smokeless tobacco: Never Used  . Alcohol use No    FAMILY HISTORY:   Family History  Problem Relation Age of Onset  . Alzheimer's disease Sister   . Arthritis Sister     DRUG ALLERGIES:   Allergies  Allergen Reactions  . Ace Inhibitors   . Sulfa Antibiotics     REVIEW OF SYSTEMS:  CONSTITUTIONAL: No fever, fatigue or weakness.  EYES: No blurred or double vision.  EARS, NOSE, AND THROAT: No tinnitus or ear pain.  RESPIRATORY: No cough, shortness of breath, wheezing or hemoptysis.  CARDIOVASCULAR: No chest pain, orthopnea, edema.  GASTROINTESTINAL: No nausea, patient had one episode of vomiting reporting melena and epigastric abdominal pain  GENITOURINARY: No dysuria, hematuria.  ENDOCRINE: No polyuria, nocturia,  HEMATOLOGY: No anemia, easy bruising or bleeding SKIN: No rash or lesion. MUSCULOSKELETAL: No joint pain or arthritis.   NEUROLOGIC: No tingling, numbness,  weakness.  PSYCHIATRY: No anxiety or depression.   MEDICATIONS AT HOME:   Prior to Admission medications   Medication Sig Start Date End Date Taking? Authorizing Provider  acetaminophen (TYLENOL) 500 MG tablet Take 1,000 mg by mouth every 8 (eight) hours as needed for mild pain or moderate pain.   Yes [provider]  atropine 1 % ophthalmic solution Place 2 drops under the tongue every hour as needed (secretions).   Yes [provider]  busPIRone (BUSPAR) 5 MG tablet Take 5 mg by mouth 2 (two) times daily.   Yes [provider]  clopidogrel (PLAVIX) 75 MG tablet Take 75 mg by mouth daily.   Yes [provider]   FLUoxetine (PROZAC) 40 MG capsule Take 40 mg by mouth daily.   Yes [provider]  furosemide (LASIX) 20 MG tablet Take 1 tablet (20 mg total) by mouth daily. 09/24/16  Yes Fritzi Mandes, MD  ipratropium-albuterol (DUONEB) 0.5-2.5 (3) MG/3ML SOLN Take 3 mLs by nebulization every 6 (six) hours. 05/11/16  Yes Wieting, Richard, MD  letrozole [facility]) 2.5 MG tablet Take 2.5 mg by mouth daily.   Yes [provider]  lidocaine (LIDODERM) 5 % Place 1 patch onto the skin daily. Remove & Discard patch within 12 hours or as directed by MD   Yes [provider]  loperamide (IMODIUM) 2 MG capsule Take 4 mg by mouth as needed for diarrhea or loose stools.   Yes [provider]  LORazepam (ATIVAN) 2 MG/ML concentrated solution Take 0.5 mg by mouth every 2 (two) hours as needed for anxiety or sedation.   Yes [provider]  meloxicam (MOBIC) 15 MG tablet Take 7.5 mg by mouth daily.   Yes [provider]  Menthol-Zinc Oxide (CALMOSEPTINE) 0.44-20.625 % OINT Apply 1 application topically as needed.   Yes [provider]  morphine (ROXANOL) 20 MG/ML concentrated solution Take 4 mg by mouth every 2 (two) hours as needed for severe pain.   Yes [provider]  nitroGLYCERIN (NITROSTAT) 0.4 MG SL tablet Place 0.4 mg under the tongue every 5 (five) minutes as needed for chest pain.   Yes [provider]  nystatin (MYCOSTATIN/NYSTOP) powder Apply topically 3 (three) times daily. 05/11/16  Yes Wieting, Richard, MD  ondansetron (ZOFRAN-ODT) 4 MG disintegrating tablet Take 4 mg by mouth every 8 (eight) hours as needed for nausea or vomiting.   Yes [provider]  potassium chloride (K-DUR) 10 MEQ tablet Take 10-20 mEq by mouth 2 (two) times daily. 2 tablets in the morning and 1 tablet at bedtime   Yes [provider]  pregabalin (LYRICA) 150 MG capsule Take 150 mg by mouth at bedtime.   Yes [provider]  traMADol  (ULTRAM) 50 MG tablet Take 50 mg by mouth every 6 (six) hours as needed.   Yes [provider]  cephALEXin (KEFLEX) 500 MG capsule Take 1 capsule (500 mg total) by mouth every 12 (twelve) hours. Patient not taking: Reported on 01/13/2017 09/22/16   Fritzi Mandes, MD  pantoprazole (PROTONIX) 40 MG tablet Take 40 mg by mouth daily.     [provider]  valsartan-hydrochlorothiazide (DIOVAN-HCT) 80-12.5 MG tablet Take 1 tablet by mouth daily. Pt advised to start from 09/24/16 Patient not taking: Reported on 01/13/2017 09/24/16   Fritzi Mandes, MD      VITAL SIGNS:  Blood pressure (!) 130/53, pulse 82, temperature 98 F (36.7 C), temperature source Oral, resp. rate 18, height 5\' 1"  (  1.549 m), weight 94.3 kg (208 lb), SpO2 97 %.  PHYSICAL EXAMINATION:  GENERAL:  83 y.o.-year-old patient lying in the bed with no acute distress.  EYES: Pupils equal, round, reactive to light and accommodation. No scleral icterus. Extraocular muscles intact.  HEENT: Head atraumatic, normocephalic. Oropharynx and nasopharynx clear.  NECK:  Supple, no jugular venous distention. No thyroid enlargement, no tenderness.  LUNGS: Normal breath sounds bilaterally, no wheezing, rales,rhonchi or crepitation. No use of accessory muscles of respiration.  CARDIOVASCULAR: S1, S2 normal. No murmurs, rubs, or gallops.  ABDOMEN: Soft, epigastric tenderness is present, no rebound tenderness is present nondistended. Bowel sounds present.  EXTREMITIES: No pedal edema, cyanosis, or clubbing.  NEUROLOGIC: Cranial nerves II through XII are intact. Muscle strength at her baseline in all extremities. Sensation intact. Gait not checked.  PSYCHIATRIC: The patient is alert and oriented x 3.  SKIN: No obvious rash, lesion, or ulcer.   LABORATORY PANEL:   CBC  Recent Labs Lab 01/13/17 1301  WBC 9.5  HGB 7.4*  HCT 21.8*  PLT 277    ------------------------------------------------------------------------------------------------------------------  Chemistries   Recent Labs Lab 01/13/17 1301  NA 139  K 4.2  CL 104  CO2 26  GLUCOSE 134*  BUN 39*  CREATININE 0.62  CALCIUM 8.6*  AST 22  ALT 12*  ALKPHOS 79  BILITOT 0.8   ------------------------------------------------------------------------------------------------------------------  Cardiac Enzymes No results for input(s): TROPONINI in the last 168 hours. ------------------------------------------------------------------------------------------------------------------  RADIOLOGY:  No results found.  EKG:   Orders placed or performed during the hospital encounter of 08/02/16  . EKG 12-Lead  . EKG 12-Lead  . EKG 12-Lead  . EKG 12-Lead    IMPRESSION AND PLAN:  Jeanette White  is a 83 y.o. female with a known history of essential hypertension, diabetes, hyperlipidemia and multiple other medical problems as sent over to the hospital for black tarry stool which was started from yesterday evening. Patient reports epigastric abdominal pain. Had one episode of vomiting but no other episodes afterwards. Patient could not recall if she had any EGD or colonoscopy done in the past. She is taking Plavix and Mobic at home. Hemoglobin dropped from 11.2 in May 2018 to 7.4 today.     # melena with epigastric abdominal pain Admit to MedSurg unit Transfuse 2 units of blood Monitor hemoglobin closely currently at 7.4, it was at 11.2 in May 2018 Monitor hemoglobin and hematocrit every 6 hours and transfuse as needed Nothing by mouth except for meds PPI IV fluids GI consult placed for possible EGD Pain management as needed Discontinue Mobic, holding Plavix at this time  #essential hypertension Not on any home medications, will monitor blood pressure closely   #Hyperlipidemia Not on any home medications-check fasting lipid panel  #Diabetes  mellitus2 Check hemoglobin A1c and provide sliding scale insulin   DVT prophylaxis with SCDs and GI prophylaxis with PPI  All the records are reviewed and case discussed with ED provider. Management plans discussed with the patient, family and they are in agreement.  CODE STATUS: DNR/ son is HCPOA  TOTAL TIME TAKING CARE OF THIS PATIENT: 50  minutes.   Note: This dictation was prepared with Dragon dictation along with smaller phrase technology. Any transcriptional errors that result from this process are unintentional.  Nicholes Mango M.D on 01/13/2017 at 3:15 PM  Between 7am to 6pm - Pager - [phone]  After 6pm go to [email or web address] - password [email or web address] [facility]  [facility]  Office  [phone]  CC: Primary  care physician; Gareth Morgan, MD

## 2017-01-13 NOTE — ED Notes (Signed)
AAOx3.  Skin warm and dry.  NAD 

## 2017-01-14 LAB — COMPREHENSIVE METABOLIC PANEL
ALT: 10 U/L — AB (ref 14–54)
AST: 15 U/L (ref 15–41)
Albumin: 2.5 g/dL — ABNORMAL LOW (ref 3.5–5.0)
Alkaline Phosphatase: 68 U/L (ref 38–126)
Anion gap: 5 (ref 5–15)
BILIRUBIN TOTAL: 1.4 mg/dL — AB (ref 0.3–1.2)
BUN: 32 mg/dL — AB (ref 6–20)
CO2: 27 mmol/L (ref 22–32)
CREATININE: 0.42 mg/dL — AB (ref 0.44–1.00)
Calcium: 7.8 mg/dL — ABNORMAL LOW (ref 8.9–10.3)
Chloride: 110 mmol/L (ref 101–111)
Glucose, Bld: 99 mg/dL (ref 65–99)
Potassium: 3.6 mmol/L (ref 3.5–5.1)
Sodium: 142 mmol/L (ref 135–145)
TOTAL PROTEIN: 4.8 g/dL — AB (ref 6.5–8.1)

## 2017-01-14 LAB — HEMOGLOBIN AND HEMATOCRIT, BLOOD
HCT: 22.1 % — ABNORMAL LOW (ref 35.0–47.0)
HEMOGLOBIN: 7.4 g/dL — AB (ref 12.0–16.0)

## 2017-01-14 LAB — CBC
HCT: 23.7 % — ABNORMAL LOW (ref 35.0–47.0)
Hemoglobin: 8.1 g/dL — ABNORMAL LOW (ref 12.0–16.0)
MCH: 30.3 pg (ref 26.0–34.0)
MCHC: 34.1 g/dL (ref 32.0–36.0)
MCV: 88.7 fL (ref 80.0–100.0)
PLATELETS: 208 10*3/uL (ref 150–440)
RBC: 2.67 MIL/uL — AB (ref 3.80–5.20)
RDW: 15.1 % — AB (ref 11.5–14.5)
WBC: 5.4 10*3/uL (ref 3.6–11.0)

## 2017-01-14 NOTE — Progress Notes (Signed)
[facility] [place] NAME: Kileen Lange    MR#:  [id number]  DATE OF BIRTH:  Dec 20, 1927  SUBJECTIVE:  CHIEF COMPLAINT:   Chief Complaint  Patient presents with  . Rectal Bleeding  wants to eat, no further bleed. Some abd pain, lives alone (3 sons in [place]), follows at [facility] and has n. Aid coming at home for help REVIEW OF SYSTEMS:  Review of Systems  Constitutional: Positive for malaise/fatigue. Negative for chills, fever and weight loss.  HENT: Negative for nosebleeds and sore throat.   Eyes: Negative for blurred vision.  Respiratory: Negative for cough, shortness of breath and wheezing.   Cardiovascular: Negative for chest pain, orthopnea, leg swelling and PND.  Gastrointestinal: Positive for abdominal pain and blood in stool. Negative for constipation, diarrhea, heartburn, nausea and vomiting.  Genitourinary: Negative for dysuria and urgency.  Musculoskeletal: Negative for back pain.  Skin: Negative for rash.  Neurological: Positive for weakness. Negative for dizziness, speech change, focal weakness and headaches.  Endo/Heme/Allergies: Does not bruise/bleed easily.  Psychiatric/Behavioral: Negative for depression.    DRUG ALLERGIES:   Allergies  Allergen Reactions  . Ace Inhibitors   . Sulfa Antibiotics    VITALS:  Blood pressure (!) 132/53, pulse 67, temperature 97.9 F (36.6 C), temperature source Oral, resp. rate 20, height 5\' 1"  (1.549 m), weight 94.3 kg (208 lb), SpO2 97 %. PHYSICAL EXAMINATION:  Physical Exam  Constitutional: She is oriented to person, place, and time and well-developed, well-nourished, and in no distress.  HENT:  Head: Normocephalic and atraumatic.  Eyes: Pupils are equal, round, and reactive to light. Conjunctivae and EOM are normal.  Neck: Normal range of motion. Neck supple. No tracheal deviation present. No thyromegaly present.  Cardiovascular: Normal rate, regular rhythm and normal  heart sounds.   Pulmonary/Chest: Effort normal and breath sounds normal. No respiratory distress. She has no wheezes. She exhibits no tenderness.  Abdominal: Soft. Bowel sounds are normal. She exhibits no distension. There is no tenderness.  Musculoskeletal: Normal range of motion.  Neurological: She is alert and oriented to person, place, and time. No cranial nerve deficit.  Skin: Skin is warm and dry. No rash noted.  Psychiatric: Mood and affect normal.   LABORATORY PANEL:  Female CBC  Recent Labs Lab 01/14/17 0437  WBC 5.4  HGB 8.1*  HCT 23.7*  PLT 208   ------------------------------------------------------------------------------------------------------------------ Chemistries   Recent Labs Lab 01/14/17 0437  NA 142  K 3.6  CL 110  CO2 27  GLUCOSE 99  BUN 32*  CREATININE 0.42*  CALCIUM 7.8*  AST 15  ALT 10*  ALKPHOS 68  BILITOT 1.4*   RADIOLOGY:  No results found. ASSESSMENT AND PLAN:  Dillon Mcreynolds  is a 83 y.o. female with a known history of essential hypertension, diabetes, hyperlipidemia, metastatic breast cancer to bone, lungs and multiple other medical problems as sent over to the hospital for black tarry stool which was started from yesterday evening. Patient reports epigastric abdominal pain. Had one episode of vomiting but no other episodes afterwards. Patient could not recall if she had any EGD or colonoscopy done in the past. She is taking Plavix and Mobic at home. Hemoglobin dropped from 11.2 in May 2018 to 7.4 on admission.    # Melena with epigastric abdominal pain: likely in the setting of Mobic and Plavix. - s/p Transfusion of 2 units of blood - Hb 8.1 this am - Continue Protonix 40  mg IV Q 12 hours. Start Clear liquid diet. - continue IV fluids - GI recommends conservative mgmt, Holding off on EGD unless bleeding worsens with change in hemodynamics.  - Pain management as needed - Discontinue Mobic, holding Plavix at this time  #essential  hypertension Not on any home medications, will monitor blood pressure closely  #Hyperlipidemia Not on any home medications  #Diabetes mellitus2 -sliding scale insulin     All the records are reviewed and case discussed with Care Management/Social Worker. Management plans discussed with the patient, nursing and they are in agreement.  CODE STATUS: DNR  TOTAL TIME TAKING CARE OF THIS PATIENT: 35 minutes.   More than 50% of the time was spent in counseling/coordination of care: YES  POSSIBLE D/C IN 1-2 DAYS, DEPENDING ON CLINICAL CONDITION. And GI eval  Max Sane M.D on 01/14/2017 at 9:53 AM  Between 7am to 6pm - Pager - [phone]  After 6pm go to [email or web address] - Proofreader  [facility]  Office  [phone]  CC: Primary care physician; Gareth Morgan, MD  Note: This dictation was prepared with Dragon dictation along with smaller phrase technology. Any transcriptional errors that result from this process are unintentional.

## 2017-01-14 NOTE — Progress Notes (Signed)
Pnt resting, appears comfortable. Pnt tol. second unit of PRBC's well no s/sx of reaction. No issues or concerns at this time will continue to monitor and assess.

## 2017-01-14 NOTE — Progress Notes (Signed)
Patient is alert and awake, but sleeping much of shift. Incontinent of urine and stool at baseline. Patient is cared for at home under [facility] program where she hopes to discharge back to. GI consulted with patient given GI bleed however endoscopy not planned for at this time. Hgb 8.1 this am. No falls/injuries.

## 2017-01-14 NOTE — Consult Note (Signed)
Referring Provider: Dr. Manuella Ghazi Primary Care Physician:  Gareth Morgan, MD Primary Gastroenterologist:  Althia Forts  Reason for Consultation:  Melena  HPI: Jeanette White is a 83 y.o. female with metastatic breast cancer to the bone and lungs admitted for a GI bleed with black tarry stools last night. Had bright red blood per rectum per her [facility] nurse practitioner (spoke to NP by phone this morning) last week and she stopped the Mobic and Plavix at that time. Hgb 10 last week. Her NP reports she has been on comfort care prior to admission. No known history of ulcers. Hgb 7.4 yesterday and s/p 2 U PRBCs to 8.1. Reports minimal abdominal pain and nausea. Denies vomiting. Hungry.  Past Medical History:  Diagnosis Date  . Anginal pain (HCC)    STABLE  . Arthritis   . Asthma    CHRONIC OBSTRUCTIVE  . B12 deficiency   . Breast cancer ([place])    has metastisized  . Complication of anesthesia    SLOW TO WAKE UP  . Coronary artery disease   . Depression    MAJOR  . Diabetes mellitus without complication ([place])   . Edema    HANDS/FEET  . GERD (gastroesophageal reflux disease)   . HOH (hard of hearing)   . Hypertension   . Myocardial infarction ([place])   . Peripheral vascular disease ([place])   . Polyneuropathy   . Renal anomaly    ARTERY STENOSIS  . Rhinitis, allergic    CHRONIC  . Shoulder disorder    ROTATOR CUFF TEAR  LEFT  . Sleep apnea   . Spinal disorder    STENOSIS    Past Surgical History:  Procedure Laterality Date  . APPENDECTOMY    . CATARACT EXTRACTION W/PHACO Left 12/09/2015   Procedure: CATARACT EXTRACTION PHACO AND INTRAOCULAR LENS PLACEMENT (IOC);  Surgeon: Estill Cotta, MD;  Location: [facility] ORS;  Service: Ophthalmology;  Laterality: Left;  [place] 01:21 AP% 22.8 CDE 31.49 fluid pack lot # [id number]  . CHOLECYSTECTOMY    . CORONARY ANGIOPLASTY     STENT  . CORONARY ARTERY BYPASS GRAFT    . TOTAL KNEE ARTHROPLASTY Bilateral     Prior to Admission medications    Medication Sig Start Date End Date Taking? Authorizing Provider  acetaminophen (TYLENOL) 500 MG tablet Take 1,000 mg by mouth every 8 (eight) hours as needed for mild pain or moderate pain.   Yes [provider]  atropine 1 % ophthalmic solution Place 2 drops under the tongue every hour as needed (secretions).   Yes [provider]  busPIRone (BUSPAR) 5 MG tablet Take 5 mg by mouth 2 (two) times daily.   Yes [provider]  clopidogrel (PLAVIX) 75 MG tablet Take 75 mg by mouth daily.   Yes [provider]  FLUoxetine (PROZAC) 40 MG capsule Take 40 mg by mouth daily.   Yes [provider]  furosemide (LASIX) 20 MG tablet Take 1 tablet (20 mg total) by mouth daily. 09/24/16  Yes Fritzi Mandes, MD  ipratropium-albuterol (DUONEB) 0.5-2.5 (3) MG/3ML SOLN Take 3 mLs by nebulization every 6 (six) hours. 05/11/16  Yes Wieting, Richard, MD  letrozole [facility]) 2.5 MG tablet Take 2.5 mg by mouth daily.   Yes [provider]  lidocaine (LIDODERM) 5 % Place 1 patch onto the skin daily. Remove & Discard patch within 12 hours or as directed by MD   Yes [provider]  loperamide (IMODIUM) 2 MG capsule Take 4 mg by  mouth as needed for diarrhea or loose stools.   Yes [provider]  LORazepam (ATIVAN) 2 MG/ML concentrated solution Take 0.5 mg by mouth every 2 (two) hours as needed for anxiety or sedation.   Yes [provider]  meloxicam (MOBIC) 15 MG tablet Take 7.5 mg by mouth daily.   Yes [provider]  Menthol-Zinc Oxide (CALMOSEPTINE) 0.44-20.625 % OINT Apply 1 application topically as needed.   Yes [provider]  morphine (ROXANOL) 20 MG/ML concentrated solution Take 4 mg by mouth every 2 (two) hours as needed for severe pain.   Yes [provider]  nitroGLYCERIN (NITROSTAT) 0.4 MG SL tablet Place 0.4 mg under the tongue every 5 (five) minutes as needed for chest pain.   Yes [provider]   nystatin (MYCOSTATIN/NYSTOP) powder Apply topically 3 (three) times daily. 05/11/16  Yes Wieting, Richard, MD  ondansetron (ZOFRAN-ODT) 4 MG disintegrating tablet Take 4 mg by mouth every 8 (eight) hours as needed for nausea or vomiting.   Yes [provider]  potassium chloride (K-DUR) 10 MEQ tablet Take 10-20 mEq by mouth 2 (two) times daily. 2 tablets in the morning and 1 tablet at bedtime   Yes [provider]  pregabalin (LYRICA) 150 MG capsule Take 150 mg by mouth at bedtime.   Yes [provider]  traMADol (ULTRAM) 50 MG tablet Take 50 mg by mouth every 6 (six) hours as needed.   Yes [provider]  cephALEXin (KEFLEX) 500 MG capsule Take 1 capsule (500 mg total) by mouth every 12 (twelve) hours. Patient not taking: Reported on 01/13/2017 09/22/16   Fritzi Mandes, MD  pantoprazole (PROTONIX) 40 MG tablet Take 40 mg by mouth daily.     [provider]  valsartan-hydrochlorothiazide (DIOVAN-HCT) 80-12.5 MG tablet Take 1 tablet by mouth daily. Pt advised to start from 09/24/16 Patient not taking: Reported on 01/13/2017 09/24/16   Fritzi Mandes, MD    Scheduled Meds: . busPIRone  5 mg Oral BID  . FLUoxetine  40 mg Oral Daily  . furosemide  20 mg Oral Daily  . letrozole  2.5 mg Oral Daily  . lidocaine  1 patch Transdermal Q24H  . pantoprazole (PROTONIX) IV  40 mg Intravenous Q12H  . potassium chloride  10 mEq Oral BID  . pregabalin  150 mg Oral Daily   Continuous Infusions: . sodium chloride Stopped (01/13/17 1850)  . sodium chloride Stopped (01/13/17 2153)   PRN Meds:.acetaminophen **OR** acetaminophen, atropine, ipratropium-albuterol, liver oil-zinc oxide, LORazepam, morphine CONCENTRATE, nitroGLYCERIN, ondansetron **OR** ondansetron (ZOFRAN) IV, oxyCODONE, traMADol  Allergies as of 01/13/2017 - Review Complete 01/13/2017  Allergen Reaction Noted  . Ace inhibitors  12/01/2015  . Sulfa antibiotics  12/01/2015    Family History  Problem  Relation Age of Onset  . Alzheimer's disease Sister   . Arthritis Sister     Social History   Social History  . Marital status: Divorced    Spouse name: N/A  . Number of children: N/A  . Years of education: N/A   Occupational History  . reitred    Social History Main Topics  . Smoking status: Never Smoker  . Smokeless tobacco: Never Used  . Alcohol use No  . Drug use: No  . Sexual activity: No   Other Topics Concern  . Not on file   Social History Narrative  . No narrative on file    Review of Systems: All negative except as stated above in HPI.  Physical  Exam: Vital signs: Vitals:   01/13/17 2300 01/14/17 0433  BP: (!) 145/51 (!) 132/53  Pulse: 80 67  Resp:  20  Temp: 97.8 F (36.6 C) 97.9 F (36.6 C)  SpO2: 100% 97%   Last BM Date: 01/14/17 General:   Lethargic, elderly, well-nourished, no acute distress HEENT: anicteric sclera, oropharynx clear Neck: supple, nontender Lungs:  Clear throughout to auscultation.   No wheezes, crackles, or rhonchi. No acute distress. Heart:  Regular rate and rhythm; no murmurs, clicks, rubs,  or gallops. Abdomen: diffuse tenderness with guarding, soft, nondistended, +BS  Rectal:  Deferred Ext: no edema  GI:  Lab Results:  Recent Labs  01/13/17 1301 01/14/17 0437  WBC 9.5 5.4  HGB 7.4* 8.1*  HCT 21.8* 23.7*  PLT 277 208   BMET  Recent Labs  01/13/17 1301 01/14/17 0437  NA 139 142  K 4.2 3.6  CL 104 110  CO2 26 27  GLUCOSE 134* 99  BUN 39* 32*  CREATININE 0.62 0.42*  CALCIUM 8.6* 7.8*   LFT  Recent Labs  01/14/17 0437  PROT 4.8*  ALBUMIN 2.5*  AST 15  ALT 10*  ALKPHOS 68  BILITOT 1.4*   PT/INR  Recent Labs  01/13/17 1301  LABPROT 13.9  INR 1.08     Studies/Results: No results found.  Impression/Plan: 84 yo with metastatic breast cancer to bone, lungs presenting with black tarry stools in setting of Mobic and Plavix. At risk for peptic ulcer disease but would manage conservatively.  Continue Protonix 40 mg IV Q 12 hours. Clear liquid diet. Hold off on EGD unless bleeding worsens with change in hemodynamics. Follow H/Hs. If Hgb stable, then consider d/c tomorrow if on comfort care measures.    LOS: 1 day   [place] C.  01/14/2017, 8:35 AM

## 2017-01-15 LAB — PREPARE RBC (CROSSMATCH)

## 2017-01-15 LAB — HEMOGLOBIN AND HEMATOCRIT, BLOOD
HCT: 20.9 % — ABNORMAL LOW (ref 35.0–47.0)
HEMATOCRIT: 24.2 % — AB (ref 35.0–47.0)
HEMOGLOBIN: 8.4 g/dL — AB (ref 12.0–16.0)
Hemoglobin: 7.1 g/dL — ABNORMAL LOW (ref 12.0–16.0)

## 2017-01-15 MED ORDER — SODIUM CHLORIDE 0.9 % IV SOLN
Freq: Once | INTRAVENOUS | Status: AC
  Administered 2017-01-15: 08:00:00 via INTRAVENOUS

## 2017-01-15 NOTE — Progress Notes (Signed)
Dr. Vianne Bulls paged with pt's BP 103/41, HR 59. MD acknowledged. No new orders.

## 2017-01-15 NOTE — Progress Notes (Signed)
[facility] [place] NAME: Jeanette White    MR#:  [id number]  DATE OF BIRTH:  01-30-1928  SUBJECTIVE:  CHIEF COMPLAINT:   Chief Complaint  Patient presents with  . Rectal Bleeding  Had melena last night, feels very weak. hemoglobin 7.1, lives alone (3 sons in [place]), follows at [facility] and has n. Aid coming at home for help REVIEW OF SYSTEMS:  Review of Systems  Constitutional: Positive for malaise/fatigue. Negative for chills, fever and weight loss.  HENT: Negative for nosebleeds and sore throat.   Eyes: Negative for blurred vision.  Respiratory: Negative for cough, shortness of breath and wheezing.   Cardiovascular: Negative for chest pain, orthopnea, leg swelling and PND.  Gastrointestinal: Positive for abdominal pain, blood in stool and melena. Negative for constipation, diarrhea, heartburn, nausea and vomiting.  Genitourinary: Negative for dysuria and urgency.  Musculoskeletal: Negative for back pain.  Skin: Negative for rash.  Neurological: Positive for weakness. Negative for dizziness, speech change, focal weakness and headaches.  Endo/Heme/Allergies: Does not bruise/bleed easily.  Psychiatric/Behavioral: Negative for depression.   DRUG ALLERGIES:   Allergies  Allergen Reactions  . Ace Inhibitors   . Sulfa Antibiotics    VITALS:  Blood pressure (!) 109/57, pulse 68, temperature 98.6 F (37 C), temperature source Oral, resp. rate 16, height 5\' 1"  (1.549 m), weight 94.3 kg (208 lb), SpO2 100 %. PHYSICAL EXAMINATION:  Physical Exam  Constitutional: She is oriented to person, place, and time and well-developed, well-nourished, and in no distress.  HENT:  Head: Normocephalic and atraumatic.  Eyes: Pupils are equal, round, and reactive to light. Conjunctivae and EOM are normal.  Neck: Normal range of motion. Neck supple. No tracheal deviation present. No thyromegaly present.  Cardiovascular: Normal rate, regular rhythm  and normal heart sounds.   Pulmonary/Chest: Effort normal and breath sounds normal. No respiratory distress. She has no wheezes. She exhibits no tenderness.  Abdominal: Soft. Bowel sounds are normal. She exhibits no distension. There is no tenderness.  Musculoskeletal: Normal range of motion.  Neurological: She is alert and oriented to person, place, and time. No cranial nerve deficit.  Skin: Skin is warm and dry. No rash noted.  Psychiatric: Mood and affect normal.   LABORATORY PANEL:  Female CBC  Recent Labs Lab 01/14/17 0437  01/15/17 0523  WBC 5.4  --   --   HGB 8.1*  < > 7.1*  HCT 23.7*  < > 20.9*  PLT 208  --   --   < > = values in this interval not displayed. ------------------------------------------------------------------------------------------------------------------ Chemistries   Recent Labs Lab 01/14/17 0437  NA 142  K 3.6  CL 110  CO2 27  GLUCOSE 99  BUN 32*  CREATININE 0.42*  CALCIUM 7.8*  AST 15  ALT 10*  ALKPHOS 68  BILITOT 1.4*   RADIOLOGY:  No results found. ASSESSMENT AND PLAN:  Jeanette White  is a 83 y.o. female with a known history of essential hypertension, diabetes, hyperlipidemia, metastatic breast cancer to bone, lungs and multiple other medical problems as sent over to the hospital for black tarry stool which was started from yesterday evening. Patient reports epigastric abdominal pain. Had one episode of vomiting but no other episodes afterwards. Patient could not recall if she had any EGD or colonoscopy done in the past. She is taking Plavix and Mobic at home. Hemoglobin dropped from 11.2 in May 2018 to 7.4 on admission.    #  Melena with epigastric abdominal pain: likely in the setting of Mobic and Plavix. - s/p Transfusion of 2 units of blood - Hb 8.1->7.1 this am.  Will order 1 more unit of red blood cell transfusion - Continue Protonix 40 mg IV Q 12 hours. Start Clear liquid diet. - continue IV fluids - GI recommends conservative  mgmt, Holding off on EGD unless bleeding worsens with change in hemodynamics.  - Pain management as needed - Discontinue Mobic, holding Plavix at this time  #essential hypertension Not on any home medications, will monitor blood pressure closely  #Hyperlipidemia Not on any home medications  #Diabetes mellitus2 -sliding scale insulin     All the records are reviewed and case discussed with Care Management/Social Worker. Management plans discussed with the patient, nursing and they are in agreement.  CODE STATUS: DNR  TOTAL TIME TAKING CARE OF THIS PATIENT: 35 minutes.   More than 50% of the time was spent in counseling/coordination of care: YES  POSSIBLE D/C IN 1-2 DAYS, DEPENDING ON CLINICAL CONDITION. And GI eval  Max Sane M.D on 01/15/2017 at 12:13 PM  Between 7am to 6pm - Pager - [phone]  After 6pm go to [email or web address] - Proofreader  [facility]  Office  [phone]  CC: Primary care physician; Jeanette Morgan, MD  Note: This dictation was prepared with Dragon dictation along with smaller phrase technology. Any transcriptional errors that result from this process are unintentional.

## 2017-01-15 NOTE — Progress Notes (Signed)
Gastroenterology Progress Note    Jeanette White 83 y.o. [birth year]   Subjective: Complaining of nausea. Black stools reported overnight. Oriented X 3  Objective: Vital signs in last 24 hours: Vitals:   01/15/17 0434 01/15/17 0800  BP: (!) 116/41 (!) 128/46  Pulse: (!) 58 68  Resp: 20   Temp: (!) 97.5 F (36.4 C)   SpO2: 95% 94%    Physical Exam: Gen: lethargic, elderly, no acute distress, oriented X 3 CV: RRR Chest: CTA B Abd: diffusely tender with guarding, soft, nondistended, +BS Ext: no edema  Lab Results:  Recent Labs  01/13/17 1301 01/14/17 0437  NA 139 142  K 4.2 3.6  CL 104 110  CO2 26 27  GLUCOSE 134* 99  BUN 39* 32*  CREATININE 0.62 0.42*  CALCIUM 8.6* 7.8*    Recent Labs  01/13/17 1301 01/14/17 0437  AST 22 15  ALT 12* 10*  ALKPHOS 79 68  BILITOT 0.8 1.4*  PROT 6.1* 4.8*  ALBUMIN 3.1* 2.5*    Recent Labs  01/13/17 1301 01/14/17 0437 01/14/17 2324 01/15/17 0523  WBC 9.5 5.4  --   --   HGB 7.4* 8.1* 7.4* 7.1*  HCT 21.8* 23.7* 22.1* 20.9*  MCV 88.7 88.7  --   --   PLT 277 208  --   --     Recent Labs  01/13/17 1301  LABPROT 13.9  INR 1.08       Assessment/Plan: Metastatic breast cancer to bone, lungs with melena likely due to an upper GI tract source of mets vs peptic ulcer disease. I discussed again whether she would want an EGD to evaluate the source of this bleeding and she firmly refuses having an EGD done. She wants to speak to the chaplain, Jeanette White from [facility] and I relayed that message to her nurse. Agree with blood transfusion. Will keep on clears due to nausea, which is likely due to her bleeding. She is agreeable to blood transfusions but no invasive GI procedures. No further recs. Will sign off. Call back if questions.   [place] C. 01/15/2017, 8:31 AMPatient ID: Jeanette White, female   DOB: 10/02/1927, 83 y.o.   MRN: [id number]

## 2017-01-15 NOTE — Progress Notes (Addendum)
Initial Nutrition Assessment  DOCUMENTATION CODES:   Obesity unspecified  INTERVENTION:  When diet able to be advanced, recommend providing Premier Protein po BID, each supplement provides 160 kcal and 30 grams of protein.  NUTRITION DIAGNOSIS:   Inadequate oral intake related to poor appetite, nausea, other (see comment) (abdominal pain) as evidenced by per patient/family report.  GOAL:   Patient will meet greater than or equal to 90% of their needs  MONITOR:   PO intake, Supplement acceptance, Labs, Weight trends, I & O's  REASON FOR ASSESSMENT:   Malnutrition Screening Tool    ASSESSMENT:   83 year old female with PMHx of arthritis, PVD, DM type 2, asthma, HTN, GERD, CAD, depression, B12 deficiency, hx of MI, hx metastatic breast cancer who presented with abdominal pain, episode of vomiting, black tarry stool.   -GI assessed patient. No plan for invasive GI procedures.  Spoke with patient at bedside. She is a participant of [facility] program. She reports she has had decreased appetite off and on for the past couple years. She reports eating 3 "healthy" meals per day. Aides that come into home help patient prepare meals and eat. She is unable to provide exact details of usual intake. She drinks water, juice, milk, coffee, and tea during the day. Patient reports she is tolerating the clear liquid diet well. She reports she is "ready for real food." Noted on exam patient was edentulous. She reports she does not have any dentures (accidentally were thrown away a while ago). She reports she does not need her food chopped up when diet able to be advanced.  She reports that 6-7 years ago she used to weigh 360 lbs. Patient reports that last month she weighed 270 lbs. This does not align with weight history in chart. Per chart patient was 222.9 lbs on 09/24/2016. She has lost 14.9 lbs (6.7% body weight) over 3 months, which is not significant for time frame.  Medications reviewed and include:  Lasix 20 mg daily, pantoprazole, potassium chloride 10 mEq BID, Zofran PRN.  Labs reviewed: BUN 32, Creatinine 0.42.  Nutrition-Focused physical exam completed. Findings are mild-moderate fat depletion of orbital region, mild-moderate muscle depletion of temple region, and no edema.   Diet Order:  Diet clear liquid Room service appropriate? Yes; Fluid consistency: Thin  Skin:  Wound (see comment) (MSAD to bilateral breast and groin)  Last BM:  01/15/2017 - type 6  Height:   Ht Readings from Last 1 Encounters:  01/13/17 5\' 1"  (1.549 m)    Weight:   Wt Readings from Last 1 Encounters:  01/13/17 208 lb (94.3 kg)    Ideal Body Weight:  47.7 kg  BMI:  Body mass index is 39.3 kg/m.  Estimated Nutritional Needs:   Kcal:  1585-1850 (MSJ x 1.2-1.4)  Protein:  95-113 grams (1-1.2 grams/kg)  Fluid:  1.6-1.8 L/day (1 ml/kcal)  EDUCATION NEEDS:   No education needs identified at this time  Willey Blade, MS, RD, LDN Pager: [phone] After Hours Pager: [phone]

## 2017-01-16 LAB — TYPE AND SCREEN
ABO/RH(D): O NEG
Antibody Screen: NEGATIVE
UNIT DIVISION: 0
UNIT DIVISION: 0
Unit division: 0

## 2017-01-16 LAB — BPAM RBC
BLOOD PRODUCT EXPIRATION DATE: 201809132359
Blood Product Expiration Date: 201809122359
Blood Product Expiration Date: 201809182359
ISSUE DATE / TIME: 201808311943
ISSUE DATE / TIME: 201808311554
ISSUE DATE / TIME: 201809021004
UNIT TYPE AND RH: 9500
Unit Type and Rh: 9500
Unit Type and Rh: 9500

## 2017-01-16 LAB — BASIC METABOLIC PANEL
Anion gap: 6 (ref 5–15)
BUN: 22 mg/dL — AB (ref 6–20)
CALCIUM: 8.1 mg/dL — AB (ref 8.9–10.3)
CO2: 28 mmol/L (ref 22–32)
CREATININE: 0.58 mg/dL (ref 0.44–1.00)
Chloride: 110 mmol/L (ref 101–111)
GFR calc Af Amer: >60 mL/min (ref >60)
GFR calc non Af Amer: >60 mL/min (ref >60)
Glucose, Bld: 90 mg/dL (ref 65–99)
Potassium: 3.2 mmol/L — ABNORMAL LOW (ref 3.5–5.1)
SODIUM: 144 mmol/L (ref 135–145)

## 2017-01-16 LAB — CBC
HCT: 23.4 % — ABNORMAL LOW (ref 35.0–47.0)
HEMOGLOBIN: 8 g/dL — AB (ref 12.0–16.0)
MCH: 30.2 pg (ref 26.0–34.0)
MCHC: 34.1 g/dL (ref 32.0–36.0)
MCV: 88.6 fL (ref 80.0–100.0)
PLATELETS: 214 10*3/uL (ref 150–440)
RBC: 2.64 MIL/uL — ABNORMAL LOW (ref 3.80–5.20)
RDW: 16.1 % — AB (ref 11.5–14.5)
WBC: 5.2 10*3/uL (ref 3.6–11.0)

## 2017-01-16 LAB — MAGNESIUM: MAGNESIUM: 1.6 mg/dL — AB (ref 1.7–2.4)

## 2017-01-16 MED ORDER — POTASSIUM CHLORIDE CRYS ER 20 MEQ PO TBCR
40.0000 meq | EXTENDED_RELEASE_TABLET | Freq: Once | ORAL | Status: AC
  Administered 2017-01-16: 10:00:00 40 meq via ORAL
  Filled 2017-01-16: qty 2

## 2017-01-16 MED ORDER — ALUM & MAG HYDROXIDE-SIMETH 200-200-20 MG/5ML PO SUSP
30.0000 mL | Freq: Four times a day (QID) | ORAL | Status: DC | PRN
  Administered 2017-01-16: 17:00:00 30 mL via ORAL
  Filled 2017-01-16: qty 30

## 2017-01-16 NOTE — Care Management Note (Signed)
Case Management Note  Patient Details  Name: Jeanette White MRN: [id number] Date of Birth: 1928/01/04  Subjective/Objective:    Admitted to [facility] with the diagnosis of Melena. Lives alone. Neighbor is Eulis Manly [phone] Sees Dr. Ovid Curd and gets medications filled at [facility]. Goes to rhe [facility] program Monday-Friday 9:00am-5:00pm. A member for 6.5 years. A nursing assistant comes in the morning to get her ready for [facility]. Another assistant comes in the afternoon to get her ready for bed. No skilled nursing. No home oxygen.               Hospital bed, hoyer lift, and wheelchair in the home. Fair appetite. Lost 40 pounds, but can't give a time frame. Last fall was 6 months ago.  [facility] will transport  Action/Plan: Possible discharge 01/17/17 per Dr. Manuella Ghazi. [facility] will transport before 2:00pm   Expected Discharge Date:                  Expected Discharge Plan:     In-House Referral:     Discharge planning Services     Post Acute Care Choice:    Choice offered to:     DME Arranged:    DME Agency:     HH Arranged:    HH Agency:     Status of Service:     If discussed at [employer], dates discussed:    Additional Comments:  Shelbie Ammons, [place] Management [phone] 01/16/2017, 9:15 AM

## 2017-01-16 NOTE — Progress Notes (Deleted)
Received MD order to discharge patient to home, reviewed home meds discharge instructions and follow up appointments with patient and husband and both verbalized understanding discharged to home by nursing in wheelchair

## 2017-01-16 NOTE — Care Management Important Message (Signed)
Important Message  Patient Details  Name: Marilyn Nihiser MRN: [id number] Date of Birth: [birth year]   Medicare Important Message Given:  Yes    Shelbie Ammons, RN 01/16/2017, 8:50 AM

## 2017-01-16 NOTE — Progress Notes (Signed)
[facility] [place] NAME: Brithney Bensen    MR#:  [id number]  DATE OF BIRTH:  10/04/1927  SUBJECTIVE:  CHIEF COMPLAINT:   Chief Complaint  Patient presents with  . Rectal Bleeding  No further melena last 24 hours, feels weak. hemoglobin 8, lives alone (3 sons in [place]), follows at [facility] and has n. Aid coming at home for help REVIEW OF SYSTEMS:  Review of Systems  Constitutional: Positive for malaise/fatigue. Negative for chills, fever and weight loss.  HENT: Negative for nosebleeds and sore throat.   Eyes: Negative for blurred vision.  Respiratory: Negative for cough, shortness of breath and wheezing.   Cardiovascular: Negative for chest pain, orthopnea, leg swelling and PND.  Gastrointestinal: Negative for constipation, diarrhea, heartburn, nausea and vomiting.  Genitourinary: Negative for dysuria and urgency.  Musculoskeletal: Negative for back pain.  Skin: Negative for rash.  Neurological: Positive for weakness. Negative for dizziness, speech change, focal weakness and headaches.  Endo/Heme/Allergies: Does not bruise/bleed easily.  Psychiatric/Behavioral: Negative for depression.   DRUG ALLERGIES:   Allergies  Allergen Reactions  . Ace Inhibitors   . Sulfa Antibiotics    VITALS:  Blood pressure 111/90, pulse 63, temperature 98.1 F (36.7 C), temperature source Axillary, resp. rate 18, height 5\' 1"  (1.549 m), weight 94.3 kg (208 lb), SpO2 93 %. PHYSICAL EXAMINATION:  Physical Exam  Constitutional: She is oriented to person, place, and time and well-developed, well-nourished, and in no distress.  HENT:  Head: Normocephalic and atraumatic.  Eyes: Pupils are equal, round, and reactive to light. Conjunctivae and EOM are normal.  Neck: Normal range of motion. Neck supple. No tracheal deviation present. No thyromegaly present.  Cardiovascular: Normal rate, regular rhythm and normal heart sounds.   Pulmonary/Chest: Effort  normal and breath sounds normal. No respiratory distress. She has no wheezes. She exhibits no tenderness.  Abdominal: Soft. Bowel sounds are normal. She exhibits no distension. There is no tenderness.  Musculoskeletal: Normal range of motion.  Neurological: She is alert and oriented to person, place, and time. No cranial nerve deficit.  Skin: Skin is warm and dry. No rash noted.  Psychiatric: Mood and affect normal.   LABORATORY PANEL:  Female CBC  Recent Labs Lab 01/16/17 0328  WBC 5.2  HGB 8.0*  HCT 23.4*  PLT 214   ------------------------------------------------------------------------------------------------------------------ Chemistries   Recent Labs Lab 01/14/17 0437 01/16/17 0328  NA 142 144  K 3.6 3.2*  CL 110 110  CO2 27 28  GLUCOSE 99 90  BUN 32* 22*  CREATININE 0.42* 0.58  CALCIUM 7.8* 8.1*  MG  --  1.6*  AST 15  --   ALT 10*  --   ALKPHOS 68  --   BILITOT 1.4*  --    RADIOLOGY:  No results found. ASSESSMENT AND PLAN:  Ashli Selders  is a 83 y.o. female with a known history of essential hypertension, diabetes, hyperlipidemia, metastatic breast cancer to bone, lungs and multiple other medical problems as sent over to the hospital for black tarry stool which was started from yesterday evening. Patient reports epigastric abdominal pain. Had one episode of vomiting but no other episodes afterwards. Patient could not recall if she had any EGD or colonoscopy done in the past. She is taking Plavix and Mobic at home. Hemoglobin dropped from 11.2 in May 2018 to 7.4 on admission.    # Melena with epigastric abdominal pain: likely in the setting of Mobic  and Plavix. - s/p Transfusion of 3 units of blood - Hb 8.1->7.1->8.0 this am - Continue Protonix 40 mg IV Q 12 hours.  Advance to soft diet - continue IV fluids - GI recommends conservative mgmt, Holding off on EGD unless bleeding worsens with change in hemodynamics.  - Pain management as needed - Discontinue  Mobic, holding Plavix at this time  *Hypokalemia -Replete and recheck  #essential hypertension Not on any home medications, will monitor blood pressure closely  #Hyperlipidemia Not on any home medications  #Diabetes mellitus2 -sliding scale insulin     All the records are reviewed and case discussed with Care Management/Social Worker. Management plans discussed with the patient, nursing and they are in agreement.  CODE STATUS: DNR  TOTAL TIME TAKING CARE OF THIS PATIENT: 35 minutes.   More than 50% of the time was spent in counseling/coordination of care: YES  POSSIBLE D/C IN 1 DAYS, DEPENDING ON CLINICAL CONDITION.  Max Sane M.D on 01/16/2017 at 11:50 AM  Between 7am to 6pm - Pager - [phone]  After 6pm go to [email or web address] - Proofreader  [facility] [place] [facility]  Office  [phone]  CC: Primary care physician; Gareth Morgan, MD  Note: This dictation was prepared with Dragon dictation along with smaller phrase technology. Any transcriptional errors that result from this process are unintentional.

## 2017-01-17 LAB — BASIC METABOLIC PANEL
ANION GAP: 4 — AB (ref 5–15)
BUN: 20 mg/dL (ref 6–20)
CO2: 29 mmol/L (ref 22–32)
Calcium: 8.1 mg/dL — ABNORMAL LOW (ref 8.9–10.3)
Chloride: 106 mmol/L (ref 101–111)
Creatinine, Ser: 0.6 mg/dL (ref 0.44–1.00)
GFR calc Af Amer: >60 mL/min (ref >60)
GLUCOSE: 113 mg/dL — AB (ref 65–99)
POTASSIUM: 3.5 mmol/L (ref 3.5–5.1)
SODIUM: 139 mmol/L (ref 135–145)

## 2017-01-17 LAB — CBC
HCT: 23.5 % — ABNORMAL LOW (ref 35.0–47.0)
Hemoglobin: 7.9 g/dL — ABNORMAL LOW (ref 12.0–16.0)
MCH: 29.8 pg (ref 26.0–34.0)
MCHC: 33.6 g/dL (ref 32.0–36.0)
MCV: 88.5 fL (ref 80.0–100.0)
PLATELETS: 210 10*3/uL (ref 150–440)
RBC: 2.66 MIL/uL — AB (ref 3.80–5.20)
RDW: 15.5 % — ABNORMAL HIGH (ref 11.5–14.5)
WBC: 5.4 10*3/uL (ref 3.6–11.0)

## 2017-01-17 MED ORDER — PANTOPRAZOLE SODIUM 40 MG PO TBEC: 40.0000 mg | DELAYED_RELEASE_TABLET | Freq: Two times a day (BID) | ORAL | 0 refills | Status: AC

## 2017-01-17 MED ORDER — PREMIER PROTEIN SHAKE
11.0000 [oz_av] | Freq: Two times a day (BID) | ORAL | Status: DC
  Administered 2017-01-17: 10:00:00 11 [oz_av] via ORAL

## 2017-01-17 NOTE — Discharge Summary (Signed)
Jeanette White NAME: Jeanette White    MR#:  [id number]  DATE OF BIRTH:  Aug 09, 1927  DATE OF ADMISSION:  01/13/2017   ADMITTING PHYSICIAN: Nicholes Mango, MD  DATE OF DISCHARGE: 01/17/2017  1:35 PM  PRIMARY CARE PHYSICIAN: Gareth Morgan, MD   ADMISSION DIAGNOSIS:  Gastrointestinal hemorrhage, unspecified gastrointestinal hemorrhage type [K92.2] DISCHARGE DIAGNOSIS:  Active Problems:   Melena  SECONDARY DIAGNOSIS:   Past Medical History:  Diagnosis Date  . Anginal pain (HCC)    STABLE  . Arthritis   . Asthma    CHRONIC OBSTRUCTIVE  . B12 deficiency   . Breast cancer ([place])    has metastisized  . Complication of anesthesia    SLOW TO WAKE UP  . Coronary artery disease   . Depression    MAJOR  . Diabetes mellitus without complication ([place])   . Edema    HANDS/FEET  . GERD (gastroesophageal reflux disease)   . HOH (hard of hearing)   . Hypertension   . Myocardial infarction ([place])   . Peripheral vascular disease ([place])   . Polyneuropathy   . Renal anomaly    ARTERY STENOSIS  . Rhinitis, allergic    CHRONIC  . Shoulder disorder    ROTATOR CUFF TEAR  LEFT  . Sleep apnea   . Spinal disorder    STENOSIS   HOSPITAL COURSE:   Jeanette Teaney Rileyis a 83 y.o. femalewith a known history of essential hypertension, diabetes, hyperlipidemia, metastatic breast cancer to bone, lungs and multiple other medical problems as sent over to the hospital for black tarry stool. Patient reported epigastric abdominal pain. Had one episode of vomiting but no other episodes afterwards. Patient could not recall if she had any EGD or colonoscopy done in the past. She is taking Plavix and Mobic at home. Hemoglobin dropped from 11.2 in May 2018 to 7.4 on admission.    # Melena with epigastric abdominal pain: likely in the setting of Mobic and Plavix. - s/p Transfusion of 3 units of blood - Hb 8.1->7.1->8.0->7.9 - tolerated diet and had no  further bleed in last 48 hrs - GI recommends conservative mgmt and patient also wants to Hold off on EGD unless bleeding worsens   *Hypokalemia -Repleted and resolved   She was instructed to watch for any bleeding or signs and if occurs, stop plavix right away and contact her PCP/[facility] program. DISCHARGE CONDITIONS:  stable CONSULTS OBTAINED:  Treatment Team:  Jonathon Bellows, MD DRUG ALLERGIES:   Allergies  Allergen Reactions  . Ace Inhibitors   . Sulfa Antibiotics    DISCHARGE MEDICATIONS:   Allergies as of 01/17/2017      Reactions   Ace Inhibitors    Sulfa Antibiotics       Medication List    STOP taking these medications   cephALEXin 500 MG capsule Commonly known as:  KEFLEX   valsartan-hydrochlorothiazide 80-12.5 MG tablet Commonly known as:  DIOVAN-HCT     TAKE these medications   acetaminophen 500 MG tablet Commonly known as:  TYLENOL Take 1,000 mg by mouth every 8 (eight) hours as needed for mild pain or moderate pain.   atropine 1 % ophthalmic solution Place 2 drops under the tongue every hour as needed (secretions).   busPIRone 5 MG tablet Commonly known as:  BUSPAR Take 5 mg by mouth 2 (two) times daily.   CALMOSEPTINE 0.44-20.625 % Oint Generic drug:  Menthol-Zinc Oxide Apply 1 application  topically as needed.   clopidogrel 75 MG tablet Commonly known as:  PLAVIX Take 75 mg by mouth daily.   FLUoxetine 40 MG capsule Commonly known as:  PROZAC Take 40 mg by mouth daily.   furosemide 20 MG tablet Commonly known as:  LASIX Take 1 tablet (20 mg total) by mouth daily.   ipratropium-albuterol 0.5-2.5 (3) MG/3ML Soln Commonly known as:  DUONEB Take 3 mLs by nebulization every 6 (six) hours.   letrozole 2.5 MG tablet Commonly known as:  FEMARA Take 2.5 mg by mouth daily.   lidocaine 5 % Commonly known as:  LIDODERM Place 1 patch onto the skin daily. Remove & Discard patch within 12 hours or as directed by MD   loperamide 2 MG  capsule Commonly known as:  IMODIUM Take 4 mg by mouth as needed for diarrhea or loose stools.   LORazepam 2 MG/ML concentrated solution Commonly known as:  ATIVAN Take 0.5 mg by mouth every 2 (two) hours as needed for anxiety or sedation.   meloxicam 15 MG tablet Commonly known as:  MOBIC Take 7.5 mg by mouth daily.   morphine 20 MG/ML concentrated solution Commonly known as:  ROXANOL Take 4 mg by mouth every 2 (two) hours as needed for severe pain.   nitroGLYCERIN 0.4 MG SL tablet Commonly known as:  NITROSTAT Place 0.4 mg under the tongue every 5 (five) minutes as needed for chest pain.   nystatin powder Commonly known as:  MYCOSTATIN/NYSTOP Apply topically 3 (three) times daily.   ondansetron 4 MG disintegrating tablet Commonly known as:  ZOFRAN-ODT Take 4 mg by mouth every 8 (eight) hours as needed for nausea or vomiting.   pantoprazole 40 MG tablet Commonly known as:  PROTONIX Take 1 tablet (40 mg total) by mouth 2 (two) times daily. What changed:  when to take this   potassium chloride 10 MEQ tablet Commonly known as:  K-DUR Take 10-20 mEq by mouth 2 (two) times daily. 2 tablets in the morning and 1 tablet at bedtime   pregabalin 150 MG capsule Commonly known as:  LYRICA Take 150 mg by mouth at bedtime.   traMADol 50 MG tablet Commonly known as:  ULTRAM Take 50 mg by mouth every 6 (six) hours as needed.            Discharge Care Instructions        Start     Ordered   01/17/17 0000  pantoprazole (PROTONIX) 40 MG tablet  2 times daily     01/17/17 0828   01/17/17 0000  Increase activity slowly     01/17/17 0828   01/17/17 0000  Diet - low sodium heart healthy     01/17/17 0828       DISCHARGE INSTRUCTIONS:   DIET:  Regular diet DISCHARGE CONDITION:  Good ACTIVITY:  Activity as tolerated OXYGEN:  Home Oxygen: No.  Oxygen Delivery: room air DISCHARGE LOCATION:  home   If you experience worsening of your admission symptoms, develop  shortness of breath, life threatening emergency, suicidal or homicidal thoughts you must seek medical attention immediately by calling 911 or calling your MD immediately  if symptoms less severe.  You Must read complete instructions/literature along with all the possible adverse reactions/side effects for all the Medicines you take and that have been prescribed to you. Take any new Medicines after you have completely understood and accpet all the possible adverse reactions/side effects.   Please note  You were cared for by a hospitalist  during your hospital stay. If you have any questions about your discharge medications or the care you received while you were in the hospital after you are discharged, you can call the unit and asked to speak with the hospitalist on call if the hospitalist that took care of you is not available. Once you are discharged, your primary care physician will handle any further medical issues. Please note that NO REFILLS for any discharge medications will be authorized once you are discharged, as it is imperative that you return to your primary care physician (or establish a relationship with a primary care physician if you do not have one) for your aftercare needs so that they can reassess your need for medications and monitor your lab values.    On the day of Discharge:  VITAL SIGNS:  Blood pressure (!) 135/55, pulse 60, temperature 98 F (36.7 C), temperature source Oral, resp. rate 20, height 5\' 1"  (1.549 m), weight 94.3 kg (208 lb), SpO2 95 %. PHYSICAL EXAMINATION:  GENERAL:  83 y.o.-year-old patient lying in the bed with no acute distress.  EYES: Pupils equal, round, reactive to light and accommodation. No scleral icterus. Extraocular muscles intact.  HEENT: Head atraumatic, normocephalic. Oropharynx and nasopharynx clear.  NECK:  Supple, no jugular venous distention. No thyroid enlargement, no tenderness.  LUNGS: Normal breath sounds bilaterally, no wheezing,  rales,rhonchi or crepitation. No use of accessory muscles of respiration.  CARDIOVASCULAR: S1, S2 normal. No murmurs, rubs, or gallops.  ABDOMEN: Soft, non-tender, non-distended. Bowel sounds present. No organomegaly or mass.  EXTREMITIES: No pedal edema, cyanosis, or clubbing.  NEUROLOGIC: Cranial nerves II through XII are intact. Muscle strength 5/5 in all extremities. Sensation intact. Gait not checked.  PSYCHIATRIC: The patient is alert and oriented x 3.  SKIN: No obvious rash, lesion, or ulcer.  DATA REVIEW:   CBC  Recent Labs Lab 01/17/17 0403  WBC 5.4  HGB 7.9*  HCT 23.5*  PLT 210    Chemistries   Recent Labs Lab 01/14/17 0437 01/16/17 0328 01/17/17 0403  NA 142 144 139  K 3.6 3.2* 3.5  CL 110 110 106  CO2 27 28 29   GLUCOSE 99 90 113*  BUN 32* 22* 20  CREATININE 0.42* 0.58 0.60  CALCIUM 7.8* 8.1* 8.1*  MG  --  1.6*  --   AST 15  --   --   ALT 10*  --   --   ALKPHOS 68  --   --   BILITOT 1.4*  --   --      Follow-up Information    Gareth Morgan, MD. Go in 1 week.   Specialty:  Family Medicine Why:  Dr. Coralie Common will see pt at facilty  Contact information: [facility] [address] [place], [place] [address] [phone]           Management plans discussed with the patient, family and they are in agreement.  CODE STATUS: Prior   TOTAL TIME TAKING CARE OF THIS PATIENT: 45 minutes.    Max Sane M.D on 01/17/2017 at 6:22 PM  Between 7am to 6pm - Pager - [phone]  After 6pm go to [email or web address] - Proofreader  [facility] [place] [facility]  Office  [phone]  CC: Primary care physician; Gareth Morgan, MD   Note: This dictation was prepared with Dragon dictation along with smaller phrase technology. Any transcriptional errors that result from this process are unintentional.

## 2017-01-17 NOTE — Progress Notes (Signed)
PT Cancellation Note  Patient Details Name: Jeanette White MRN: [id number] DOB: 22-Feb-1928   Cancelled Treatment:    Reason Eval/Treat Not Completed: Other (comment). Consult received and chart reviewed. Pt is part of [facility] services. Has all equipment needed at home. Spoke to CM, no PT needs at this time, planned discharge back to [facility] this date. Will dc current orders.    Orit Sanville 01/17/2017, 10:33 AM  Greggory Stallion, PT, DPT [phone]

## 2017-01-17 NOTE — Discharge Instructions (Signed)
Gastrointestinal Bleeding °Gastrointestinal bleeding is bleeding somewhere along the path food travels through the body (digestive tract). This path is anywhere between the mouth and the opening of the butt (anus). You may have blood in your poop (stools) or have black poop. If you throw up (vomit), there may be blood in it. °This condition can be mild, serious, or even life-threatening. If you have a lot of bleeding, you may need to stay in the hospital. °Follow these instructions at home: °· Take over-the-counter and prescription medicines only as told by your doctor. °· Eat foods that have a lot of fiber in them. These foods include whole grains, fruits, and vegetables. You can also try eating 1-3 prunes each day. °· Drink enough fluid to keep your pee (urine) clear or pale yellow. °· Keep all follow-up visits as told by your doctor. This is important. °Contact a doctor if: °· Your symptoms do not get better. °Get help right away if: °· Your bleeding gets worse. °· You feel dizzy or you pass out (faint). °· You feel weak. °· You have very bad cramps in your back or belly (abdomen). °· You pass large clumps of blood (clots) in your poop. °· Your symptoms are getting worse. °This information is not intended to replace advice given to you by your health care provider. Make sure you discuss any questions you have with your health care provider. °Document Released: 02/09/2008 Document Revised: 10/08/2015 Document Reviewed: 10/20/2014 °Elsevier Interactive Patient Education © 2018 [employer] ° °

## 2017-01-17 NOTE — Care Management (Signed)
Discharge to home today per Dr. Manuella Ghazi. Transportation will be provided by [facility]. Shelbie Ammons RN MSN CCM Care Management [phone]

## 2017-02-24 ENCOUNTER — Inpatient Hospital Stay: Payer: Medicare (Managed Care)

## 2017-02-24 ENCOUNTER — Emergency Department: Payer: Medicare (Managed Care)

## 2017-02-24 ENCOUNTER — Inpatient Hospital Stay
Admission: EM | Admit: 2017-02-24 | Discharge: 2017-02-27 | DRG: 378 | Disposition: A | Discharge status: Home Health | Payer: Medicare (Managed Care) | Attending: Internal Medicine | Admitting: Internal Medicine

## 2017-02-24 DIAGNOSIS — R55 Syncope and collapse: Secondary | ICD-10-CM | POA: Diagnosis present

## 2017-02-24 DIAGNOSIS — Z66 Do not resuscitate: Secondary | ICD-10-CM | POA: Diagnosis present

## 2017-02-24 DIAGNOSIS — K922 Gastrointestinal hemorrhage, unspecified: Secondary | ICD-10-CM | POA: Diagnosis present

## 2017-02-24 DIAGNOSIS — Z515 Encounter for palliative care: Secondary | ICD-10-CM | POA: Diagnosis present

## 2017-02-24 DIAGNOSIS — Z7951 Long term (current) use of inhaled steroids: Secondary | ICD-10-CM

## 2017-02-24 DIAGNOSIS — K219 Gastro-esophageal reflux disease without esophagitis: Secondary | ICD-10-CM | POA: Diagnosis present

## 2017-02-24 DIAGNOSIS — I2510 Atherosclerotic heart disease of native coronary artery without angina pectoris: Secondary | ICD-10-CM | POA: Diagnosis present

## 2017-02-24 DIAGNOSIS — I252 Old myocardial infarction: Secondary | ICD-10-CM | POA: Diagnosis not present

## 2017-02-24 DIAGNOSIS — Z96653 Presence of artificial knee joint, bilateral: Secondary | ICD-10-CM | POA: Diagnosis present

## 2017-02-24 DIAGNOSIS — G4730 Sleep apnea, unspecified: Secondary | ICD-10-CM | POA: Diagnosis present

## 2017-02-24 DIAGNOSIS — Z9861 Coronary angioplasty status: Secondary | ICD-10-CM

## 2017-02-24 DIAGNOSIS — Z951 Presence of aortocoronary bypass graft: Secondary | ICD-10-CM

## 2017-02-24 DIAGNOSIS — Z853 Personal history of malignant neoplasm of breast: Secondary | ICD-10-CM | POA: Diagnosis not present

## 2017-02-24 DIAGNOSIS — Z6839 Body mass index (BMI) 39.0-39.9, adult: Secondary | ICD-10-CM

## 2017-02-24 DIAGNOSIS — Z7401 Bed confinement status: Secondary | ICD-10-CM | POA: Diagnosis not present

## 2017-02-24 DIAGNOSIS — R609 Edema, unspecified: Secondary | ICD-10-CM | POA: Diagnosis not present

## 2017-02-24 DIAGNOSIS — E669 Obesity, unspecified: Secondary | ICD-10-CM | POA: Diagnosis present

## 2017-02-24 DIAGNOSIS — F329 Major depressive disorder, single episode, unspecified: Secondary | ICD-10-CM | POA: Diagnosis present

## 2017-02-24 DIAGNOSIS — E1151 Type 2 diabetes mellitus with diabetic peripheral angiopathy without gangrene: Secondary | ICD-10-CM | POA: Diagnosis present

## 2017-02-24 DIAGNOSIS — I82512 Chronic embolism and thrombosis of left femoral vein: Secondary | ICD-10-CM | POA: Diagnosis present

## 2017-02-24 DIAGNOSIS — Z79811 Long term (current) use of aromatase inhibitors: Secondary | ICD-10-CM | POA: Diagnosis not present

## 2017-02-24 DIAGNOSIS — Z79899 Other long term (current) drug therapy: Secondary | ICD-10-CM

## 2017-02-24 DIAGNOSIS — D638 Anemia in other chronic diseases classified elsewhere: Secondary | ICD-10-CM | POA: Diagnosis present

## 2017-02-24 DIAGNOSIS — I10 Essential (primary) hypertension: Secondary | ICD-10-CM | POA: Diagnosis present

## 2017-02-24 DIAGNOSIS — Z85038 Personal history of other malignant neoplasm of large intestine: Secondary | ICD-10-CM

## 2017-02-24 DIAGNOSIS — Z7902 Long term (current) use of antithrombotics/antiplatelets: Secondary | ICD-10-CM

## 2017-02-24 DIAGNOSIS — J45909 Unspecified asthma, uncomplicated: Secondary | ICD-10-CM | POA: Diagnosis present

## 2017-02-24 DIAGNOSIS — K625 Hemorrhage of anus and rectum: Secondary | ICD-10-CM | POA: Diagnosis not present

## 2017-02-24 LAB — COMPREHENSIVE METABOLIC PANEL
ALK PHOS: 97 U/L (ref 38–126)
ALT: 8 U/L — AB (ref 14–54)
AST: 16 U/L (ref 15–41)
Albumin: 3.3 g/dL — ABNORMAL LOW (ref 3.5–5.0)
Anion gap: 9 (ref 5–15)
BILIRUBIN TOTAL: 0.3 mg/dL (ref 0.3–1.2)
BUN: 16 mg/dL (ref 6–20)
CALCIUM: 8.9 mg/dL (ref 8.9–10.3)
CO2: 30 mmol/L (ref 22–32)
CREATININE: 0.61 mg/dL (ref 0.44–1.00)
Chloride: 101 mmol/L (ref 101–111)
Glucose, Bld: 103 mg/dL — ABNORMAL HIGH (ref 65–99)
Potassium: 4 mmol/L (ref 3.5–5.1)
Sodium: 140 mmol/L (ref 135–145)
Total Protein: 6.8 g/dL (ref 6.5–8.1)

## 2017-02-24 LAB — CBC
HCT: 27.1 % — ABNORMAL LOW (ref 35.0–47.0)
Hemoglobin: 8.7 g/dL — ABNORMAL LOW (ref 12.0–16.0)
MCH: 25.7 pg — ABNORMAL LOW (ref 26.0–34.0)
MCHC: 32 g/dL (ref 32.0–36.0)
MCV: 80.4 fL (ref 80.0–100.0)
PLATELETS: 317 10*3/uL (ref 150–440)
RBC: 3.37 MIL/uL — AB (ref 3.80–5.20)
RDW: 16.8 % — AB (ref 11.5–14.5)
WBC: 4.7 10*3/uL (ref 3.6–11.0)

## 2017-02-24 MED ORDER — IOPAMIDOL (ISOVUE-300) INJECTION 61%
30.0000 mL | Freq: Once | INTRAVENOUS | Status: AC
  Administered 2017-02-24: 30 mL via ORAL

## 2017-02-24 MED ORDER — IOPAMIDOL (ISOVUE-300) INJECTION 61%
100.0000 mL | Freq: Once | INTRAVENOUS | Status: AC | PRN
  Administered 2017-02-24: 100 mL via INTRAVENOUS

## 2017-02-24 MED ORDER — PANTOPRAZOLE SODIUM 40 MG IV SOLR
40.0000 mg | Freq: Two times a day (BID) | INTRAVENOUS | Status: DC
  Administered 2017-02-25 (×2): 40 mg via INTRAVENOUS
  Filled 2017-02-24 (×2): qty 40

## 2017-02-24 NOTE — ED Triage Notes (Signed)
Pt c/o bright red rectal "spotting" for last few days, and bright red bloody stool today. Hx of rectal bleed a few months ago per patient, pt received blood transfusion. Pt alert and oriented X4, active, cooperative, pt in NAD. RR even and unlabored, pt slightly pale.

## 2017-02-24 NOTE — H&P (Addendum)
History and Physical   [facility] - [place] @ [facility] Admission History and Physical [employer], D.O.    Patient Name: Jeanette White MR#: [id number] Date of Birth: [birth year] Date of Admission: 02/24/2017  Referring MD/NP/PA: Dr. Cinda Quest Primary Care Physician: Gareth Morgan, MD  Chief Complaint:  Chief Complaint  Patient presents with  . Abdominal Pain  . GI Problem    HPI: Jeanette White is a 83 y.o. female with a known history of Metastatic colon cancer, Asthma, coronary artery disease, and mild diabetes, hypertension, renal artery stenosis presents to the emergency department for evaluation of GI bleed.  Patient was in a usual state of health until today when she noticed bright red blood per rectum associated with crampy lower abdominal pain. Patient states that she has had GI bleeds due to metastatic colon cancer in the past which have required transfusions however she has not recently been scope because she was being medically managed. She reports some mild generalized weakness but otherwise feels well. She was at home alone and is concerned about going home.  She reports some generalized weakness, with near loss of consciousness.   Patient denies fevers/chills, dizziness, chest pain, shortness of breath, N/V/C/D,dysuria/frequency, changes in mental status.    Otherwise there has been no change in status. Patient has been taking medication as prescribed and there has been no recent change in medication or diet.  No recent antibiotics.  There has been no recent illness, hospitalizations, travel or sick contacts.    Medical admission has been requested for further management of rectal bleeding, near syncope.   Review of Systems:  CONSTITUTIONAL: Positive weakness. No fever/chills, fatigue, weight gain/loss, headache. EYES: No blurry or double vision. ENT: No tinnitus, postnasal drip, redness or soreness of the oropharynx. RESPIRATORY: No cough, dyspnea,  wheeze.  No hemoptysis.  CARDIOVASCULAR: No chest pain, palpitations, syncope, orthopnea. No lower extremity edema.  GASTROINTESTINAL: No nausea, vomiting, abdominal pain, diarrhea, constipation.  No hematemesis, melena. . Positive rectal bleeding GENITOURINARY: No dysuria, frequency, hematuria. ENDOCRINE: No polyuria or nocturia. No heat or cold intolerance. HEMATOLOGY: No anemia, bruising, bleeding. INTEGUMENTARY: No rashes, ulcers, lesions. MUSCULOSKELETAL: No arthritis, gout, dyspnea. NEUROLOGIC: No numbness, tingling, ataxia, seizure-type activity, weakness. PSYCHIATRIC: No anxiety, depression, insomnia.   Past Medical History:  Diagnosis Date  . Anginal pain (HCC)    STABLE  . Arthritis   . Asthma    CHRONIC OBSTRUCTIVE  . B12 deficiency   . Breast cancer ([place])    has metastisized  . Complication of anesthesia    SLOW TO WAKE UP  . Coronary artery disease   . Depression    MAJOR  . Diabetes mellitus without complication ([place])   . Edema    HANDS/FEET  . GERD (gastroesophageal reflux disease)   . HOH (hard of hearing)   . Hypertension   . Myocardial infarction ([place])   . Peripheral vascular disease ([place])   . Polyneuropathy   . Renal anomaly    ARTERY STENOSIS  . Rhinitis, allergic    CHRONIC  . Shoulder disorder    ROTATOR CUFF TEAR  LEFT  . Sleep apnea   . Spinal disorder    STENOSIS    Past Surgical History:  Procedure Laterality Date  . APPENDECTOMY    . CATARACT EXTRACTION W/PHACO Left 12/09/2015   Procedure: CATARACT EXTRACTION PHACO AND INTRAOCULAR LENS PLACEMENT (IOC);  Surgeon: Estill Cotta, MD;  Location: [facility] ORS;  Service: Ophthalmology;  Laterality: Left;  [place] 01:21  AP% 22.8 CDE 31.49 fluid pack lot # [id number]  . CHOLECYSTECTOMY    . CORONARY ANGIOPLASTY     STENT  . CORONARY ARTERY BYPASS GRAFT    . TOTAL KNEE ARTHROPLASTY Bilateral      reports that she has never smoked. She has never used smokeless tobacco. She reports that she does  not drink alcohol or use drugs.  Allergies  Allergen Reactions  . Ace Inhibitors   . Sulfa Antibiotics     Family History  Problem Relation Age of Onset  . Alzheimer's disease Sister   . Arthritis Sister     Prior to Admission medications   Medication Sig Start Date End Date Taking? Authorizing Provider  acetaminophen (TYLENOL) 500 MG tablet Take 1,000 mg by mouth every 8 (eight) hours as needed for mild pain or moderate pain.    [provider]  atropine 1 % ophthalmic solution Place 2 drops under the tongue every hour as needed (secretions).    [provider]  busPIRone (BUSPAR) 5 MG tablet Take 5 mg by mouth 2 (two) times daily.    [provider]  clopidogrel (PLAVIX) 75 MG tablet Take 75 mg by mouth daily.    [provider]  FLUoxetine (PROZAC) 40 MG capsule Take 40 mg by mouth daily.    [provider]  furosemide (LASIX) 20 MG tablet Take 1 tablet (20 mg total) by mouth daily. 09/24/16   Fritzi Mandes, MD  ipratropium-albuterol (DUONEB) 0.5-2.5 (3) MG/3ML SOLN Take 3 mLs by nebulization every 6 (six) hours. 05/11/16   Loletha Grayer, MD  letrozole [facility]) 2.5 MG tablet Take 2.5 mg by mouth daily.    [provider]  lidocaine (LIDODERM) 5 % Place 1 patch onto the skin daily. Remove & Discard patch within 12 hours or as directed by MD    [provider]  loperamide (IMODIUM) 2 MG capsule Take 4 mg by mouth as needed for diarrhea or loose stools.    [provider]  LORazepam (ATIVAN) 2 MG/ML concentrated solution Take 0.5 mg by mouth every 2 (two) hours as needed for anxiety or sedation.    [provider]  meloxicam (MOBIC) 15 MG tablet Take 7.5 mg by mouth daily.    [provider]  Menthol-Zinc Oxide (CALMOSEPTINE) 0.44-20.625 % OINT Apply 1 application topically as needed.    [provider]  morphine (ROXANOL) 20 MG/ML concentrated solution Take 4 mg by mouth every 2 (two)  hours as needed for severe pain.    [provider]  nitroGLYCERIN (NITROSTAT) 0.4 MG SL tablet Place 0.4 mg under the tongue every 5 (five) minutes as needed for chest pain.    [provider]  nystatin (MYCOSTATIN/NYSTOP) powder Apply topically 3 (three) times daily. 05/11/16   Loletha Grayer, MD  ondansetron (ZOFRAN-ODT) 4 MG disintegrating tablet Take 4 mg by mouth every 8 (eight) hours as needed for nausea or vomiting.    [provider]  pantoprazole (PROTONIX) 40 MG tablet Take 1 tablet (40 mg total) by mouth 2 (two) times daily. 01/17/17   Max Sane, MD  potassium chloride (K-DUR) 10 MEQ tablet Take 10-20 mEq by mouth 2 (two) times daily. 2 tablets in the morning and 1 tablet at bedtime    [provider]  pregabalin (LYRICA) 150 MG capsule Take 150 mg by mouth at bedtime.    [provider]  traMADol (ULTRAM) 50 MG tablet Take 50 mg by mouth every 6 (six) hours  as needed.    [provider]    Physical Exam: Vitals:   02/24/17 1915 02/24/17 2000 02/24/17 2030 02/24/17 2130  BP: (!) 182/113 (!) 182/102 (!) 144/111   Pulse: 70 73 68 71  Resp:      Temp:      TempSrc:      SpO2: 95% 99% 98% 99%  Weight:      Height:        GENERAL: 83 y.o.-year-old Female patient, well-developed, well-nourished lying in the bed in no acute distress.  Pleasant and cooperative.   HEENT: Head atraumatic, normocephalic. Pupils equal. Mucus membranes moist. NECK: Supple, full range of motion. No JVD, no bruit heard. No thyroid enlargement, no tenderness, no cervical lymphadenopathy. CHEST: Normal breath sounds bilaterally. No wheezing, rales, rhonchi or crackles. No use of accessory muscles of respiration.  No reproducible chest wall tenderness.  CARDIOVASCULAR: S1, S2 normal. No murmurs, rubs, or gallops. Cap refill <2 seconds. Pulses intact distally.  ABDOMEN: Soft, nondistended, mild bilateral lower quadrant tenderness. No rebound, guarding,  rigidity. Normoactive bowel sounds present in all four quadrants.  EXTREMITIES: No pedal edema, cyanosis, or clubbing. No calf tenderness or Homan's sign.  NEUROLOGIC: The patient is alert and oriented x 3. Cranial nerves II through XII are grossly intact with no focal sensorimotor deficit. PSYCHIATRIC:  Normal affect, mood, thought content. SKIN: Warm, dry, and intact without obvious rash, lesion, or ulcer.    Labs on Admission:  CBC:  Recent Labs Lab 02/24/17 1701  WBC 4.7  HGB 8.7*  HCT 27.1*  MCV 80.4  PLT 161   Basic Metabolic Panel:  Recent Labs Lab 02/24/17 1701  NA 140  K 4.0  CL 101  CO2 30  GLUCOSE 103*  BUN 16  CREATININE 0.61  CALCIUM 8.9   GFR: Estimated Creatinine Clearance: 50.8 mL/min (by C-G formula based on SCr of 0.61 mg/dL). Liver Function Tests:  Recent Labs Lab 02/24/17 1701  AST 16  ALT 8*  ALKPHOS 97  BILITOT 0.3  PROT 6.8  ALBUMIN 3.3*   No results for input(s): LIPASE, AMYLASE in the last 168 hours. No results for input(s): AMMONIA in the last 168 hours. Coagulation Profile: No results for input(s): INR, PROTIME in the last 168 hours. Cardiac Enzymes: No results for input(s): CKTOTAL, CKMB, CKMBINDEX, TROPONINI in the last 168 hours. BNP (last 3 results) No results for input(s): PROBNP in the last 8760 hours. HbA1C: No results for input(s): HGBA1C in the last 72 hours. CBG: No results for input(s): GLUCAP in the last 168 hours. Lipid Profile: No results for input(s): CHOL, HDL, LDLCALC, TRIG, CHOLHDL, LDLDIRECT in the last 72 hours. Thyroid Function Tests: No results for input(s): TSH, T4TOTAL, FREET4, T3FREE, THYROIDAB in the last 72 hours. Anemia Panel: No results for input(s): VITAMINB12, FOLATE, FERRITIN, TIBC, IRON, RETICCTPCT in the last 72 hours. Urine analysis:    Component Value Date/Time   COLORURINE YELLOW (A) 09/21/2016 2147   APPEARANCEUR HAZY (A) 09/21/2016 2147   APPEARANCEUR Clear 04/18/2013 1944    LABSPEC 1.017 09/21/2016 2147   LABSPEC 1.012 04/18/2013 1944   PHURINE 8.0 09/21/2016 2147   GLUCOSEU NEGATIVE 09/21/2016 2147   GLUCOSEU Negative 04/18/2013 1944   [place] NEGATIVE 09/21/2016 2147   [place] NEGATIVE 09/21/2016 2147   BILIRUBINUR Negative 04/18/2013 1944   [place] NEGATIVE 09/21/2016 2147   PROTEINUR 30 (A) 09/21/2016 2147   NITRITE POSITIVE (A) 09/21/2016 2147   LEUKOCYTESUR LARGE (A) 09/21/2016 2147   LEUKOCYTESUR Negative 04/18/2013  1944   Sepsis Labs: @LABRCNTIP (procalcitonin:4,lacticidven:4) )No results found for this or any previous visit (from the past 240 hour(s)).   Radiological Exams on Admission: Ct Abdomen Pelvis W Contrast  Result Date: 02/24/2017 CLINICAL DATA:  83 year old female with lower abdominal pain, bright red blood per rectum, fatigue. EXAM: CT ABDOMEN AND PELVIS WITH CONTRAST TECHNIQUE: Multidetector CT imaging of the abdomen and pelvis was performed using the standard protocol following bolus administration of intravenous contrast. CONTRAST:  156mL ISOVUE-300 IOPAMIDOL (ISOVUE-300) INJECTION 61% COMPARISON:  09/24/2016 and earlier, including chest CT 05/07/2016. FINDINGS: Lower chest: Chronic cardiomegaly. No pericardial effusion. No definite pleural effusion. Stable lung bases including scattered small and indistinct pulmonary and subpleural nodules. Rounded right breast mass measuring 2.2 cm on series 2, image 5. This is regressed compared to 05/07/2016. Hepatobiliary: Surgically absent gallbladder. Stable to decreased intra and extrahepatic biliary ducts size since May. Pancreas: Stable pancreatic atrophy. Spleen: Negative. Adrenals/Urinary Tract: Normal adrenal glands. Stable kidneys. Bilateral renal enhancement and contrast excretion is symmetric and within normal limits. Distended but otherwise unremarkable urinary bladder. Stomach/Bowel: Redundant sigmoid and transverse colon with retained stool throughout the mid and distal colon. Oral  contrast has reached the hepatic flexure. Negative terminal ileum. No dilated small bowel. Negative stomach and duodenum. No abdominal free fluid. Vascular/Lymphatic: Aortoiliac calcified atherosclerosis. Major arterial structures are patent. Portal venous system is patent. Stable mildly increased in number retroperitoneal lymph nodes. Stable maximal pelvic sidewall nodes. At the common femoral veins there may be venous inflow artifact, the common femoral vein thrombus cannot be excluded (peptic early on the right series 2, image 72). Reproductive: Not identified and presumed surgically absent. Other: No pelvic free fluid. Postoperative changes to the ventral lower abdominal wall are stable. Musculoskeletal: Levoconvex lumbar spine curvature. Stable visualized osseous structures including mixed lytic and sclerotic bone lesions, such as a lytic lesion in the T12 lamina and spinous process (sagittal image 59). Prominent lytic lesion also in the left medial iliac bone and proximal right femur. No pathologic fracture identified. IMPRESSION: 1. Difficult to exclude common femoral vein DVT (series 2, image 72), although probably reflecting venous inflow artifact. Recommend Bilateral Lower Extremity Doppler evaluation. 2. Evidence of widespread osseous metastatic disease appears stable since 09/24/2016 (please see that report). 3. Rounded right breast mass measuring 2.2 cm has regressed since 05/07/2016 (please see that report). 4. No new abnormality identified in the abdomen or pelvis. 5.  Aortic Atherosclerosis (ICD10-I70.0). Electronically Signed   By: Genevie Ann M.D.   On: 02/24/2017 21:01    Assessment/Plan  This is a 83 y.o. female with a history of Metastatic colon cancer, Asthma, coronary artery disease, and mild diabetes, hypertension, renal artery stenosis now being admitted with:  #. Lower GI Bleed - Admit observation - IV Protonix 40mg  BID - Serial CBCs - Nothing by mouth - IV fluid hydration - Hold  anticoagulants - GI consultation has been requested - Patient has serologic Ab for which blood is being delivered from [place] in AM.   #. Possible DVT on CT - Check BL LE [place] ADDENDUM:  Positive nonocclusive LLE DVT.  Heparin would be contraindicated in this patient due to both active GI bleeding and history of massive GI hemorrhage.    #. History of CAD Continue Plavix, nitroglycerin  #. History of hypertension Continue Lasix  #. History of depression - Continue BuSpar, Prozac  #. History of breast cancer - Continue Femara  Admission status: Observation IV Fluids: Normal saline Diet/Nutrition: Nothing by mouth Consults  called: GI  DVT Px: Early ambulation.  Chemoprophylaxis contraindicated due to GI bleeding.  LLE SCDs contraindicated due to DVT.   SCD to RLE only Code Status: DNR/DNI Disposition Plan: To home in 1-2 days  All the records are reviewed and case discussed with ED provider. Management plans discussed with the patient and/or family who express understanding and agree with plan of care.  Harel Repetto D.O. on 02/24/2017 at 11:28 PM Between 7am to 6pm - Pager - [phone] After 6pm go to [email or web address] - [employer] [facility] [place] [facility] Office [phone] CC: Primary care physician; Gareth Morgan, MD   02/24/2017, 11:28 PM

## 2017-02-24 NOTE — ED Notes (Addendum)
Pt. Reports lower abdominal cramping today.  Pt. Reports some bright red rectal spotting today.  Pt. Also reports feeling slightly tired today.

## 2017-02-24 NOTE — ED Notes (Signed)
Lab called to report antibody JKA for patient.  Blood will have to be ordered if needed.

## 2017-02-24 NOTE — ED Notes (Signed)
Pt. Reports she does not walk and is bound to wheelchair.

## 2017-02-24 NOTE — ED Provider Notes (Addendum)
[facility] Emergency Department Provider Note   ____________________________________________   None    (approximate)  I have reviewed the triage vital signs and the nursing notes.   HISTORY  Chief Complaint Abdominal Pain and GI Problem    HPI Jeanette White is a 83 y.o. female patient reports she has had a GI bleed in the past with melena and had to have a transfusion. She is having some spotting with stool today and some lower abdominal pain which is mostly crampy. She does not have any vomiting or nausea. She does not have hemorrhoids. Pain is moderate worse with palpation   Past Medical History:  Diagnosis Date  . Anginal pain (HCC)    STABLE  . Arthritis   . Asthma    CHRONIC OBSTRUCTIVE  . B12 deficiency   . Breast cancer ([place])    has metastisized  . Complication of anesthesia    SLOW TO WAKE UP  . Coronary artery disease   . Depression    MAJOR  . Diabetes mellitus without complication ([place])   . Edema    HANDS/FEET  . GERD (gastroesophageal reflux disease)   . HOH (hard of hearing)   . Hypertension   . Myocardial infarction ([place])   . Peripheral vascular disease ([place])   . Polyneuropathy   . Renal anomaly    ARTERY STENOSIS  . Rhinitis, allergic    CHRONIC  . Shoulder disorder    ROTATOR CUFF TEAR  LEFT  . Sleep apnea   . Spinal disorder    STENOSIS    Patient Active Problem List   Diagnosis Date Noted  . Melena 01/13/2017  . Weakness 09/22/2016  . Slurred speech 06/17/2016  . Hypercalcemia 05/07/2016  . Pulmonary nodules/lesions, multiple 05/07/2016    Past Surgical History:  Procedure Laterality Date  . APPENDECTOMY    . CATARACT EXTRACTION W/PHACO Left 12/09/2015   Procedure: CATARACT EXTRACTION PHACO AND INTRAOCULAR LENS PLACEMENT (IOC);  Surgeon: Estill Cotta, MD;  Location: [facility] ORS;  Service: Ophthalmology;  Laterality: Left;  [place] 01:21 AP% 22.8 CDE 31.49 fluid pack lot # [id number]  . CHOLECYSTECTOMY     . CORONARY ANGIOPLASTY     STENT  . CORONARY ARTERY BYPASS GRAFT    . TOTAL KNEE ARTHROPLASTY Bilateral     Prior to Admission medications   Medication Sig Start Date End Date Taking? Authorizing Provider  acetaminophen (TYLENOL) 500 MG tablet Take 1,000 mg by mouth every 8 (eight) hours as needed for mild pain or moderate pain.    [provider]  atropine 1 % ophthalmic solution Place 2 drops under the tongue every hour as needed (secretions).    [provider]  busPIRone (BUSPAR) 5 MG tablet Take 5 mg by mouth 2 (two) times daily.    [provider]  clopidogrel (PLAVIX) 75 MG tablet Take 75 mg by mouth daily.    [provider]  FLUoxetine (PROZAC) 40 MG capsule Take 40 mg by mouth daily.    [provider]  furosemide (LASIX) 20 MG tablet Take 1 tablet (20 mg total) by mouth daily. 09/24/16   Fritzi Mandes, MD  ipratropium-albuterol (DUONEB) 0.5-2.5 (3) MG/3ML SOLN Take 3 mLs by nebulization every 6 (six) hours. 05/11/16   Loletha Grayer, MD  letrozole [facility]) 2.5 MG tablet Take 2.5 mg by mouth daily.    [provider]  lidocaine (LIDODERM) 5 % Place 1 patch onto the skin daily. Remove & Discard patch within  12 hours or as directed by MD    [provider]  loperamide (IMODIUM) 2 MG capsule Take 4 mg by mouth as needed for diarrhea or loose stools.    [provider]  LORazepam (ATIVAN) 2 MG/ML concentrated solution Take 0.5 mg by mouth every 2 (two) hours as needed for anxiety or sedation.    [provider]  meloxicam (MOBIC) 15 MG tablet Take 7.5 mg by mouth daily.    [provider]  Menthol-Zinc Oxide (CALMOSEPTINE) 0.44-20.625 % OINT Apply 1 application topically as needed.    [provider]  morphine (ROXANOL) 20 MG/ML concentrated solution Take 4 mg by mouth every 2 (two) hours as needed for severe pain.    [provider]  nitroGLYCERIN (NITROSTAT) 0.4 MG SL tablet  Place 0.4 mg under the tongue every 5 (five) minutes as needed for chest pain.    [provider]  nystatin (MYCOSTATIN/NYSTOP) powder Apply topically 3 (three) times daily. 05/11/16   Loletha Grayer, MD  ondansetron (ZOFRAN-ODT) 4 MG disintegrating tablet Take 4 mg by mouth every 8 (eight) hours as needed for nausea or vomiting.    [provider]  pantoprazole (PROTONIX) 40 MG tablet Take 1 tablet (40 mg total) by mouth 2 (two) times daily. 01/17/17   Max Sane, MD  potassium chloride (K-DUR) 10 MEQ tablet Take 10-20 mEq by mouth 2 (two) times daily. 2 tablets in the morning and 1 tablet at bedtime    [provider]  pregabalin (LYRICA) 150 MG capsule Take 150 mg by mouth at bedtime.    [provider]  traMADol (ULTRAM) 50 MG tablet Take 50 mg by mouth every 6 (six) hours as needed.    [provider]    Allergies Ace inhibitors and Sulfa antibiotics  Family History  Problem Relation Age of Onset  . Alzheimer's disease Sister   . Arthritis Sister     Social History Social History  Substance Use Topics  . Smoking status: Never Smoker  . Smokeless tobacco: Never Used  . Alcohol use No    Review of Systems  Constitutional: No fever/chills Eyes: No visual changes. ENT: No sore throat. Cardiovascular: Denies chest pain. Respiratory: Denies shortness of breath. Gastrointestinal:See history of present illness. Genitourinary: Negative for dysuria. Musculoskeletal: Negative for back pain. Skin: Negative for rash. Neurological: Negative for headaches, focal weakness  ____________________________________________   PHYSICAL EXAM:  VITAL SIGNS: ED Triage Vitals [02/24/17 1704]  Enc Vitals Group     BP 136/72     Pulse Rate 66     Resp 18     Temp 97.9 F (36.6 C)     Temp Source Oral     SpO2 99 %     Weight 200 lb (90.7 kg)     Height 5\' 1"  (1.549 m)     Head Circumference      Peak Flow      Pain Score 1     Pain Loc        Pain Edu?      Excl. in [place]?     Constitutional: Alert and oriented. Well appearing and in no acute distress. Eyes: Conjunctivae are normal. PERRL. EOMI. Head: Atraumatic. Nose: No congestion/rhinnorhea. Mouth/Throat: Mucous membranes are moist.  Oropharynx non-erythematous. Neck: No stridor. Cardiovascular: Normal rate, regular rhythm. Grossly normal heart sounds.  Good peripheral circulation. Respiratory: Normal respiratory effort.  No retractions. Lungs CTAB. Gastrointestinal: Soft Tender to palpation in the lower abdomen more midline  than off to either side No distention. No abdominal bruits. No CVA tenderness. Musculoskeletal: No lower extremity tenderness nor edema.  No joint effusions. Neurologic:  Normal speech and language. No new gross focal neurologic deficits are appreciated.  Skin:  Skin is warm, dry and intact. No rash noted. Psychiatric: Mood and affect are normal. Speech and behavior are normal.  ____________________________________________   LABS (all labs ordered are listed, but only abnormal results are displayed)  Labs Reviewed  COMPREHENSIVE METABOLIC PANEL - Abnormal; Notable for the following:       Result Value   Glucose, Bld 103 (*)    Albumin 3.3 (*)    ALT 8 (*)    All other components within normal limits  CBC - Abnormal; Notable for the following:    RBC 3.37 (*)    Hemoglobin 8.7 (*)    HCT 27.1 (*)    MCH 25.7 (*)    RDW 16.8 (*)    All other components within normal limits  POC OCCULT BLOOD, ED  TYPE AND SCREEN   ____________________________________________  EKG   ____________________________________________  RADIOLOGY  CT shows no acute pathology ____________________________________________   PROCEDURES  Procedure(s) performed:   Procedures  Critical Care performed:   ____________________________________________   INITIAL IMPRESSION / ASSESSMENT AND PLAN / ED COURSE  Review of old records show patient had a very  bad GI bleed recently which required transfusion. She is having more bleeding now. She feels a little weak and lightheaded although her labs look okay. She did not have a colonoscopy previously because GI felt they wanted to try conservative management. Since she is having red blood per rectum I think the best thing to do would be at least watch her overnight and consult GI again.  diagnosis could be bleeding from a polyp or diverticulosis or an AVM or a cancer really doesn't make any difference at this point with the history of the massive bleed that she had earlier.   ____________________________________________   FINAL CLINICAL IMPRESSION(S) / ED DIAGNOSES  Final diagnoses:  Gastrointestinal hemorrhage, unspecified gastrointestinal hemorrhage type      NEW MEDICATIONS STARTED DURING THIS VISIT:  New Prescriptions   No medications on file     Note:  This document was prepared using Dragon voice recognition software and may include unintentional dictation errors.    Nena Polio, MD 02/24/17 2147    Nena Polio, MD 02/24/17 [id number]

## 2017-02-25 DIAGNOSIS — R609 Edema, unspecified: Secondary | ICD-10-CM

## 2017-02-25 DIAGNOSIS — K922 Gastrointestinal hemorrhage, unspecified: Principal | ICD-10-CM

## 2017-02-25 LAB — CBC
HCT: 26.8 % — ABNORMAL LOW (ref 35.0–47.0)
HEMATOCRIT: 24.1 % — AB (ref 35.0–47.0)
HEMOGLOBIN: 7.7 g/dL — AB (ref 12.0–16.0)
HEMOGLOBIN: 8.6 g/dL — AB (ref 12.0–16.0)
MCH: 25.9 pg — AB (ref 26.0–34.0)
MCH: 26 pg (ref 26.0–34.0)
MCHC: 32 g/dL (ref 32.0–36.0)
MCHC: 32.1 g/dL (ref 32.0–36.0)
MCV: 80.8 fL (ref 80.0–100.0)
MCV: 81.2 fL (ref 80.0–100.0)
Platelets: 271 10*3/uL (ref 150–440)
Platelets: 278 10*3/uL (ref 150–440)
RBC: 2.96 MIL/uL — ABNORMAL LOW (ref 3.80–5.20)
RBC: 3.31 MIL/uL — AB (ref 3.80–5.20)
RDW: 16.8 % — AB (ref 11.5–14.5)
RDW: 17.1 % — ABNORMAL HIGH (ref 11.5–14.5)
WBC: 4.4 10*3/uL (ref 3.6–11.0)
WBC: 4.4 10*3/uL (ref 3.6–11.0)

## 2017-02-25 LAB — BASIC METABOLIC PANEL
ANION GAP: 8 (ref 5–15)
BUN: 10 mg/dL (ref 6–20)
CHLORIDE: 105 mmol/L (ref 101–111)
CO2: 30 mmol/L (ref 22–32)
Calcium: 8.3 mg/dL — ABNORMAL LOW (ref 8.9–10.3)
Creatinine, Ser: 0.58 mg/dL (ref 0.44–1.00)
GFR calc Af Amer: >60 mL/min (ref >60)
GFR calc non Af Amer: >60 mL/min (ref >60)
GLUCOSE: 97 mg/dL (ref 65–99)
POTASSIUM: 3.5 mmol/L (ref 3.5–5.1)
Sodium: 143 mmol/L (ref 135–145)

## 2017-02-25 LAB — HEMOGLOBIN: Hemoglobin: 8.1 g/dL — ABNORMAL LOW (ref 12.0–16.0)

## 2017-02-25 MED ORDER — ONDANSETRON HCL 4 MG PO TABS: 4.0000 mg | ORAL_TABLET | Freq: Four times a day (QID) | ORAL | Status: DC | PRN

## 2017-02-25 MED ORDER — POTASSIUM CHLORIDE CRYS ER 20 MEQ PO TBCR: 20.0000 meq | EXTENDED_RELEASE_TABLET | Freq: Every morning | ORAL | Status: DC

## 2017-02-25 MED ORDER — MAGNESIUM CITRATE PO SOLN
1.0000 | Freq: Once | ORAL | Status: DC | PRN
  Filled 2017-02-25: qty 296

## 2017-02-25 MED ORDER — SODIUM CHLORIDE 0.9 % IV SOLN
INTRAVENOUS | Status: DC
  Administered 2017-02-25: 03:00:00 via INTRAVENOUS

## 2017-02-25 MED ORDER — POTASSIUM CHLORIDE CRYS ER 20 MEQ PO TBCR
20.0000 meq | EXTENDED_RELEASE_TABLET | Freq: Every morning | ORAL | Status: DC
  Administered 2017-02-25 – 2017-02-27 (×3): 20 meq via ORAL
  Filled 2017-02-25 (×3): qty 1

## 2017-02-25 MED ORDER — POTASSIUM CHLORIDE ER 10 MEQ PO TBCR: 10.0000 meq | EXTENDED_RELEASE_TABLET | Freq: Two times a day (BID) | ORAL | Status: DC

## 2017-02-25 MED ORDER — ACETAMINOPHEN 500 MG PO TABS
1000.0000 mg | ORAL_TABLET | Freq: Three times a day (TID) | ORAL | Status: DC | PRN
  Administered 2017-02-27: 1000 mg via ORAL
  Filled 2017-02-25: qty 2

## 2017-02-25 MED ORDER — BISACODYL 5 MG PO TBEC: 5.0000 mg | DELAYED_RELEASE_TABLET | Freq: Every day | ORAL | Status: DC | PRN

## 2017-02-25 MED ORDER — MORPHINE SULFATE (CONCENTRATE) 10 MG/0.5ML PO SOLN: 4.0000 mg | ORAL | Status: DC | PRN

## 2017-02-25 MED ORDER — POTASSIUM CHLORIDE CRYS ER 10 MEQ PO TBCR: 10.0000 meq | EXTENDED_RELEASE_TABLET | Freq: Every evening | ORAL | Status: DC

## 2017-02-25 MED ORDER — IPRATROPIUM-ALBUTEROL 0.5-2.5 (3) MG/3ML IN SOLN: 3.0000 mL | Freq: Four times a day (QID) | RESPIRATORY_TRACT | Status: DC

## 2017-02-25 MED ORDER — NYSTATIN 100000 UNIT/GM EX POWD
Freq: Three times a day (TID) | CUTANEOUS | Status: DC
  Administered 2017-02-25 – 2017-02-27 (×6): via TOPICAL
  Filled 2017-02-25: qty 15

## 2017-02-25 MED ORDER — ATROPINE SULFATE 1 % OP SOLN
2.0000 [drp] | OPHTHALMIC | Status: DC | PRN
  Filled 2017-02-25: qty 2

## 2017-02-25 MED ORDER — SENNOSIDES-DOCUSATE SODIUM 8.6-50 MG PO TABS: 1.0000 | ORAL_TABLET | Freq: Every evening | ORAL | Status: DC | PRN

## 2017-02-25 MED ORDER — LETROZOLE 2.5 MG PO TABS
2.5000 mg | ORAL_TABLET | Freq: Every day | ORAL | Status: DC
  Administered 2017-02-25 – 2017-02-27 (×3): 2.5 mg via ORAL
  Filled 2017-02-25 (×3): qty 1

## 2017-02-25 MED ORDER — LIDOCAINE 5 % EX PTCH
1.0000 | MEDICATED_PATCH | CUTANEOUS | Status: DC
  Administered 2017-02-26 – 2017-02-27 (×2): 1 via TRANSDERMAL
  Filled 2017-02-25 (×3): qty 1

## 2017-02-25 MED ORDER — PANTOPRAZOLE SODIUM 40 MG PO TBEC
40.0000 mg | DELAYED_RELEASE_TABLET | Freq: Two times a day (BID) | ORAL | Status: DC
  Administered 2017-02-25 – 2017-02-27 (×6): 40 mg via ORAL
  Filled 2017-02-25 (×6): qty 1

## 2017-02-25 MED ORDER — BUSPIRONE HCL 5 MG PO TABS
5.0000 mg | ORAL_TABLET | Freq: Two times a day (BID) | ORAL | Status: DC
  Administered 2017-02-25 – 2017-02-27 (×6): 5 mg via ORAL
  Filled 2017-02-25 (×7): qty 1

## 2017-02-25 MED ORDER — FLUOXETINE HCL 20 MG PO CAPS
40.0000 mg | ORAL_CAPSULE | Freq: Every day | ORAL | Status: DC
  Administered 2017-02-25 – 2017-02-27 (×3): 40 mg via ORAL
  Filled 2017-02-25 (×3): qty 2

## 2017-02-25 MED ORDER — NITROGLYCERIN 0.4 MG SL SUBL: 0.4000 mg | SUBLINGUAL_TABLET | SUBLINGUAL | Status: DC | PRN

## 2017-02-25 MED ORDER — PREGABALIN 75 MG PO CAPS
150.0000 mg | ORAL_CAPSULE | Freq: Every day | ORAL | Status: DC
  Administered 2017-02-25 – 2017-02-26 (×3): 150 mg via ORAL
  Filled 2017-02-25 (×3): qty 2

## 2017-02-25 MED ORDER — IPRATROPIUM-ALBUTEROL 0.5-2.5 (3) MG/3ML IN SOLN: 3.0000 mL | Freq: Four times a day (QID) | RESPIRATORY_TRACT | Status: DC | PRN

## 2017-02-25 MED ORDER — POTASSIUM CHLORIDE CRYS ER 10 MEQ PO TBCR
10.0000 meq | EXTENDED_RELEASE_TABLET | Freq: Every day | ORAL | Status: DC
  Administered 2017-02-25 – 2017-02-26 (×3): 10 meq via ORAL
  Filled 2017-02-25 (×3): qty 1

## 2017-02-25 MED ORDER — TRAMADOL HCL 50 MG PO TABS: 50.0000 mg | ORAL_TABLET | Freq: Four times a day (QID) | ORAL | Status: DC | PRN

## 2017-02-25 MED ORDER — FUROSEMIDE 20 MG PO TABS
20.0000 mg | ORAL_TABLET | Freq: Every day | ORAL | Status: DC
  Administered 2017-02-25: 20 mg via ORAL
  Filled 2017-02-25: qty 1

## 2017-02-25 MED ORDER — ONDANSETRON HCL 4 MG/2ML IJ SOLN: 4.0000 mg | Freq: Four times a day (QID) | INTRAMUSCULAR | Status: DC | PRN

## 2017-02-25 MED ORDER — IPRATROPIUM-ALBUTEROL 0.5-2.5 (3) MG/3ML IN SOLN
3.0000 mL | Freq: Four times a day (QID) | RESPIRATORY_TRACT | Status: DC
  Administered 2017-02-25: 3 mL via RESPIRATORY_TRACT
  Filled 2017-02-25: qty 3

## 2017-02-25 MED ORDER — LORAZEPAM 2 MG/ML PO CONC: 0.5000 mg | ORAL | Status: DC | PRN

## 2017-02-25 NOTE — Progress Notes (Signed)
Received pt from ED @ 0130. A+Ox4. Pt oriented to room and unit. Skin assessment completed with Dawn RN. Pink foam placed on sacrum for protection. Will place SCDs when available. Pt denies pain. Pt had Bm, no blood noted. Due medications given. Call light within reach.

## 2017-02-25 NOTE — Consult Note (Signed)
Vascular and Vein Specialist of [facility]  Patient name: Jeanette White MRN: [id number] DOB: 1927/11/20 Sex: female   REQUESTING PROVIDER:    Hospital service   REASON FOR CONSULT:    Possible L LE DVT  HISTORY OF PRESENT ILLNESS:   Jeanette White is a 82 y.o. female, who has a history of metastatic breast canccer.  She presented to the ER with BRBPR with crampy lower abdominal pain.  She has mild weakness and near LOC.  She has a history of CAD, DM, HTN and RAS.the patient was in the hospital for GI bleed recently.  She has chronic left leg swelling as well as pain, but states that maybe this got a little worse after her last admission.  A CT scan showed [possible bilateral common femoral vein thrombus and a U/S was recommended which reported a non-occlusive femoral vein DVT.  I have been asked for consult as she can not be anticoagulated due to her GIB  PAST MEDICAL HISTORY    Past Medical History:  Diagnosis Date  . Anginal pain (HCC)    STABLE  . Arthritis   . Asthma    CHRONIC OBSTRUCTIVE  . B12 deficiency   . Breast cancer ([place])    has metastisized  . Complication of anesthesia    SLOW TO WAKE UP  . Coronary artery disease   . Depression    MAJOR  . Diabetes mellitus without complication ([place])   . Edema    HANDS/FEET  . GERD (gastroesophageal reflux disease)   . HOH (hard of hearing)   . Hypertension   . Myocardial infarction ([place])   . Peripheral vascular disease ([place])   . Polyneuropathy   . Renal anomaly    ARTERY STENOSIS  . Rhinitis, allergic    CHRONIC  . Shoulder disorder    ROTATOR CUFF TEAR  LEFT  . Sleep apnea   . Spinal disorder    STENOSIS     FAMILY HISTORY   Family History  Problem Relation Age of Onset  . Alzheimer's disease Sister   . Arthritis Sister     SOCIAL HISTORY:   Social History   Social History  . Marital status: Divorced    Spouse name: N/A  . Number of children: N/A  .  Years of education: N/A   Occupational History  . reitred    Social History Main Topics  . Smoking status: Never Smoker  . Smokeless tobacco: Never Used  . Alcohol use No  . Drug use: No  . Sexual activity: No   Other Topics Concern  . Not on file   Social History Narrative  . No narrative on file    ALLERGIES:    Allergies  Allergen Reactions  . Ace Inhibitors   . Sulfa Antibiotics     CURRENT MEDICATIONS:    Current Facility-Administered Medications  Medication Dose Route Frequency Provider Last Rate Last Dose  . 0.9 %  sodium chloride infusion   Intravenous Continuous Hugelmeyer, Alexis, DO 75 mL/hr at 02/25/17 0258    . acetaminophen (TYLENOL) tablet 1,000 mg  1,000 mg Oral Q8H PRN Hugelmeyer, Alexis, DO      . atropine 1 % ophthalmic solution 2 drop  2 drop Sublingual Q1H PRN Hugelmeyer, Alexis, DO      . bisacodyl (DULCOLAX) EC tablet 5 mg  5 mg Oral Daily PRN Hugelmeyer, Alexis, DO      . busPIRone (BUSPAR) tablet 5 mg  5 mg Oral BID Hugelmeyer,  Alexis, DO   5 mg at 02/25/17 0944  . FLUoxetine (PROZAC) capsule 40 mg  40 mg Oral Daily Hugelmeyer, Alexis, DO   40 mg at 02/25/17 0944  . ipratropium-albuterol (DUONEB) 0.5-2.5 (3) MG/3ML nebulizer solution 3 mL  3 mL Nebulization Q6H PRN Hugelmeyer, Alexis, DO      . letrozole [facility]) tablet 2.5 mg  2.5 mg Oral Daily Hugelmeyer, Alexis, DO   2.5 mg at 02/25/17 0944  . lidocaine (LIDODERM) 5 % 1 patch  1 patch Transdermal Q24H Hugelmeyer, Alexis, DO      . LORazepam (ATIVAN) 2 MG/ML concentrated solution 0.5 mg  0.5 mg Oral Q2H PRN Hugelmeyer, Alexis, DO      . magnesium citrate solution 1 Bottle  1 Bottle Oral Once PRN Hugelmeyer, Alexis, DO      . morphine CONCENTRATE 10 MG/0.5ML oral solution 4 mg  4 mg Oral Q2H PRN Hugelmeyer, Alexis, DO      . nitroGLYCERIN (NITROSTAT) SL tablet 0.4 mg  0.4 mg Sublingual Q5 min PRN Hugelmeyer, Alexis, DO      . nystatin (MYCOSTATIN/NYSTOP) topical powder   Topical TID Hugelmeyer,  Alexis, DO      . ondansetron (ZOFRAN) tablet 4 mg  4 mg Oral Q6H PRN Hugelmeyer, Alexis, DO       Or  . ondansetron (ZOFRAN) injection 4 mg  4 mg Intravenous Q6H PRN Hugelmeyer, Alexis, DO      . pantoprazole (PROTONIX) EC tablet 40 mg  40 mg Oral BID Hugelmeyer, Alexis, DO   40 mg at 02/25/17 0943  . potassium chloride (K-DUR,KLOR-CON) CR tablet 10 mEq  10 mEq Oral QHS Hugelmeyer, Alexis, DO   10 mEq at 02/25/17 0257   And  . potassium chloride SA (K-DUR,KLOR-CON) CR tablet 20 mEq  20 mEq Oral q morning - 10a Hugelmeyer, Alexis, DO   20 mEq at 02/25/17 0944  . pregabalin (LYRICA) capsule 150 mg  150 mg Oral QHS Hugelmeyer, Alexis, DO   150 mg at 02/25/17 0257  . senna-docusate (Senokot-S) tablet 1 tablet  1 tablet Oral QHS PRN Hugelmeyer, Alexis, DO      . traMADol (ULTRAM) tablet 50 mg  50 mg Oral Q6H PRN Hugelmeyer, Alexis, DO        REVIEW OF SYSTEMS:   [X]  denotes positive finding, [ ]  denotes negative finding Cardiac  Comments:  Chest pain or chest pressure:    Shortness of breath upon exertion:    Short of breath when lying flat:    Irregular heart rhythm:        Vascular    Pain in calf, thigh, or hip brought on by ambulation:    Pain in feet at night that wakes you up from your sleep:     Blood clot in your veins:    Leg swelling:  x       Pulmonary    Oxygen at home:    Productive cough:     Wheezing:         Neurologic    Sudden weakness in arms or legs:     Sudden numbness in arms or legs:     Sudden onset of difficulty speaking or slurred speech:    Temporary loss of vision in one eye:     Problems with dizziness:         Gastrointestinal    Blood in stool:  x    Vomited blood:         Genitourinary  Burning when urinating:     Blood in urine:        Psychiatric    Major depression:         Hematologic    Bleeding problems:    Problems with blood clotting too easily:        Skin    Rashes or ulcers:        Constitutional    Fever or chills:      PHYSICAL EXAM:   Vitals:   02/25/17 0144 02/25/17 0335 02/25/17 0448 02/25/17 0900  BP: (!) 133/59  (!) 100/58 121/66  Pulse: 62  62 63  Resp: 20  17 18   Temp: 97.6 F (36.4 C)  (!) 97.5 F (36.4 C) 97.7 F (36.5 C)  TempSrc: Oral  Oral Oral  SpO2: 95% 96% 95% 96%  Weight: 210 lb 12.2 oz (95.6 kg)     Height: 5\' 1"  (1.549 m)       GENERAL: The patient is a well-nourished female, in no acute distress. The vital signs are documented above. CARDIAC: There is a regular rate and rhythm.  VASCULAR: bilateral edema. PULMONARY: Nonlabored respirations MUSCULOSKELETAL: There are no major deformities or cyanosis. NEUROLOGIC: No focal weakness or paresthesias are detected. SKIN: There are no ulcers or rashes noted. PSYCHIATRIC: The patient has a normal affect.  STUDIES:    I have reviewed the CT scan and u/s findings with Dr Pascal Lux of IR.  We both feel that the Ct scan findings are consistent with artifact.  The u/s shows non-occlusive areas that appear to be age indeterminent, most likely not acute  ASSESSMENT and PLAN   Possible left leg DVT: The patient does not endorse acute symptoms.  Based on the imaging studies and our discussions, I suspect that this is a subacute problem.  She is not a candidate for anticoagulation because of her GI bleed.  We discussed 2 options.  The first would be placement of a IVC filter.  The second would be doing nothing.  The patient would like to speak with her family as well as Dr. Ovid Curd prior to making any definitive decisions.  I will continue to follow the patient while she is in the hospital with plans for possibly placing an IVC filter next week.   Annamarie Major, MD [facility] Specialists of [facility] [phone] Pager [phone]

## 2017-02-25 NOTE — Progress Notes (Signed)
PT Cancellation Note  Patient Details Name: Jeanette White MRN: [id number] DOB: 1927-08-18   Cancelled Treatment:    Reason Eval/Treat Not Completed: Patient declined, no reason specified; Pt resting in bed and declining PT at this date due to pain in LE and needing time to "process what's going on."  Pt reports she uses a w/c for mobility at baseline and participates in the PACE program.  Will continue to follow.  Nochum Fenter A Camani Sesay, PT 02/25/2017, 2:42 PM

## 2017-02-25 NOTE — Progress Notes (Signed)
[place] [facility] [place] NAME: Jeanette White    MR#:  [id number]  DATE OF BIRTH:  03/18/1928  SUBJECTIVE:  CHIEF COMPLAINT:   Chief Complaint  Patient presents with  . Abdominal Pain  . GI Problem   No more rectal bleeding. REVIEW OF SYSTEMS:  Review of Systems  Constitutional: Negative for chills, fever and malaise/fatigue.  HENT: Negative for sore throat.   Eyes: Negative for blurred vision and double vision.  Respiratory: Negative for cough, hemoptysis, shortness of breath, wheezing and stridor.   Cardiovascular: Negative for chest pain, palpitations, orthopnea and leg swelling.  Gastrointestinal: Negative for abdominal pain, blood in stool, diarrhea, melena, nausea and vomiting.  Genitourinary: Negative for dysuria, flank pain and hematuria.  Musculoskeletal: Negative for back pain and joint pain.  Skin: Negative for rash.  Neurological: Negative for dizziness, sensory change, focal weakness, seizures, loss of consciousness, weakness and headaches.  Endo/Heme/Allergies: Negative for polydipsia.  Psychiatric/Behavioral: Negative for depression. The patient is not nervous/anxious.     DRUG ALLERGIES:   Allergies  Allergen Reactions  . Ace Inhibitors   . Sulfa Antibiotics    VITALS:  Blood pressure (!) 136/45, pulse (!) 53, temperature 98.2 F (36.8 C), temperature source Oral, resp. rate 18, height 5\' 1"  (1.549 m), weight 210 lb 12.2 oz (95.6 kg), SpO2 97 %. PHYSICAL EXAMINATION:  Physical Exam  Constitutional: She is oriented to person, place, and time and well-developed, well-nourished, and in no distress.  Morbid obesity.  HENT:  Head: Normocephalic.  Mouth/Throat: Oropharynx is clear and moist.  Eyes: Pupils are equal, round, and reactive to light. Conjunctivae and EOM are normal. No scleral icterus.  Neck: Normal range of motion. Neck supple. No JVD present. No tracheal deviation present.  Cardiovascular: Normal  rate, regular rhythm and normal heart sounds.  Exam reveals no gallop.   No murmur heard. Pulmonary/Chest: Effort normal and breath sounds normal. No respiratory distress. She has no wheezes. She has no rales.  Abdominal: Soft. Bowel sounds are normal. She exhibits no distension. There is no tenderness. There is no rebound.  Musculoskeletal: Normal range of motion. She exhibits no edema or tenderness.  Neurological: She is alert and oriented to person, place, and time. No cranial nerve deficit.  Skin: No rash noted. No erythema.  Psychiatric: Affect normal.   LABORATORY PANEL:  Female CBC  Recent Labs Lab 02/25/17 0507 02/25/17 1214  WBC 4.4  --   HGB 7.7* 8.1*  HCT 24.1*  --   PLT 271  --    ------------------------------------------------------------------------------------------------------------------ Chemistries   Recent Labs Lab 02/24/17 1701 02/25/17 0507  NA 140 143  K 4.0 3.5  CL 101 105  CO2 30 30  GLUCOSE 103* 97  BUN 16 10  CREATININE 0.61 0.58  CALCIUM 8.9 8.3*  AST 16  --   ALT 8*  --   ALKPHOS 97  --   BILITOT 0.3  --    RADIOLOGY:  Ct Abdomen Pelvis W Contrast  Result Date: 02/24/2017 CLINICAL DATA:  83 year old female with lower abdominal pain, bright red blood per rectum, fatigue. EXAM: CT ABDOMEN AND PELVIS WITH CONTRAST TECHNIQUE: Multidetector CT imaging of the abdomen and pelvis was performed using the standard protocol following bolus administration of intravenous contrast. CONTRAST:  17mL ISOVUE-300 IOPAMIDOL (ISOVUE-300) INJECTION 61% COMPARISON:  09/24/2016 and earlier, including chest CT 05/07/2016. FINDINGS: Lower chest: Chronic cardiomegaly. No pericardial effusion. No definite pleural effusion. Stable lung bases including scattered  small and indistinct pulmonary and subpleural nodules. Rounded right breast mass measuring 2.2 cm on series 2, image 5. This is regressed compared to 05/07/2016. Hepatobiliary: Surgically absent gallbladder. Stable  to decreased intra and extrahepatic biliary ducts size since May. Pancreas: Stable pancreatic atrophy. Spleen: Negative. Adrenals/Urinary Tract: Normal adrenal glands. Stable kidneys. Bilateral renal enhancement and contrast excretion is symmetric and within normal limits. Distended but otherwise unremarkable urinary bladder. Stomach/Bowel: Redundant sigmoid and transverse colon with retained stool throughout the mid and distal colon. Oral contrast has reached the hepatic flexure. Negative terminal ileum. No dilated small bowel. Negative stomach and duodenum. No abdominal free fluid. Vascular/Lymphatic: Aortoiliac calcified atherosclerosis. Major arterial structures are patent. Portal venous system is patent. Stable mildly increased in number retroperitoneal lymph nodes. Stable maximal pelvic sidewall nodes. At the common femoral veins there may be venous inflow artifact, the common femoral vein thrombus cannot be excluded (peptic early on the right series 2, image 72). Reproductive: Not identified and presumed surgically absent. Other: No pelvic free fluid. Postoperative changes to the ventral lower abdominal wall are stable. Musculoskeletal: Levoconvex lumbar spine curvature. Stable visualized osseous structures including mixed lytic and sclerotic bone lesions, such as a lytic lesion in the T12 lamina and spinous process (sagittal image 59). Prominent lytic lesion also in the left medial iliac bone and proximal right femur. No pathologic fracture identified. IMPRESSION: 1. Difficult to exclude common femoral vein DVT (series 2, image 72), although probably reflecting venous inflow artifact. Recommend Bilateral Lower Extremity Doppler evaluation. 2. Evidence of widespread osseous metastatic disease appears stable since 09/24/2016 (please see that report). 3. Rounded right breast mass measuring 2.2 cm has regressed since 05/07/2016 (please see that report). 4. No new abnormality identified in the abdomen or pelvis.  5.  Aortic Atherosclerosis (ICD10-I70.0). Electronically Signed   By: Genevie Ann M.D.   On: 02/24/2017 21:01   US Venous Img Lower Bilateral  Addendum Date: 02/25/2017   ADDENDUM REPORT: 02/25/2017 13:12 : ADDENDUM: Requested review of bilateral lower extremity venous Doppler ultrasound demonstrates age-indeterminate, though likely chronic, nonocclusive DVT within the mid (image 73) and distal (image 50) aspects of the left femoral vein. The bilateral common femoral veins as well as the bilateral saphenofemoral junctions are widely patent. As such, the previously questioned nonocclusive DVT within the bilateral common femoral veins is favored to represent mixing artifact due to non-opacified blood from the bilateral greater saphenous veins. Critical Value/emergent results were called by telephone at the time of interpretation on 02/25/2017 at 12:42 pm to Dr. Trula Slade (vascular surgery), who verbally acknowledged these results. Electronically Signed   By: Sandi Mariscal M.D.   On: 02/25/2017 13:12   Result Date: 02/25/2017 CLINICAL DATA:  Evaluate for possible DVT, questioned on prior CT of the abdomen and pelvis. EXAM: BILATERAL LOWER EXTREMITY VENOUS DOPPLER ULTRASOUND TECHNIQUE: Gray-scale sonography with graded compression, as well as color Doppler and duplex ultrasound were performed to evaluate the lower extremity deep venous systems from the level of the common femoral vein and including the common femoral, femoral, profunda femoral, popliteal and calf veins including the posterior tibial, peroneal and gastrocnemius veins when visible. The superficial great saphenous vein was also interrogated. Spectral Doppler was utilized to evaluate flow at rest and with distal augmentation maneuvers in the common femoral, femoral and popliteal veins. COMPARISON:  Prior CT from earlier the same day. FINDINGS: RIGHT LOWER EXTREMITY Common Femoral Vein: No evidence of thrombus. Normal compressibility, respiratory phasicity  and response to augmentation. Saphenofemoral Junction: No  evidence of thrombus. Normal compressibility and flow on color Doppler imaging. Profunda Femoral Vein: No evidence of thrombus. Normal compressibility and flow on color Doppler imaging. Femoral Vein: Nonocclusive echogenic thrombus within the mid and distal left femoral vein. Popliteal Vein: No evidence of thrombus. Normal compressibility, respiratory phasicity and response to augmentation. Calf Veins: Veins of the calf are poorly visualized on this exam, and not well assessed. Superficial Great Saphenous Vein: No evidence of thrombus. Normal compressibility. Venous Reflux:  None. Other Findings:  None. LEFT LOWER EXTREMITY Common Femoral Vein: No evidence of thrombus. Normal compressibility, respiratory phasicity and response to augmentation. Saphenofemoral Junction: No evidence of thrombus. Normal compressibility and flow on color Doppler imaging. Profunda Femoral Vein: No evidence of thrombus. Normal compressibility and flow on color Doppler imaging. Femoral Vein: No evidence of thrombus. Normal compressibility, respiratory phasicity and response to augmentation. Popliteal Vein: No evidence of thrombus. Normal compressibility, respiratory phasicity and response to augmentation. Calf Veins: Veins of the calf are poorly visualized on this exam, and not well assessed. Superficial Great Saphenous Vein: No evidence of thrombus. Normal compressibility. Venous Reflux:  None. Other Findings:  None. IMPRESSION: 1. Positive study with nonocclusive DVT in the mid and distal left femoral vein. 2. Poor visualization of the veins of the calf bilaterally, not well assessed on this exam. Electronically Signed: By: Jeannine Boga M.D. On: 02/25/2017 00:26   ASSESSMENT AND PLAN:   This is a 83 y.o. female with a history of Metastatic colon cancer, Asthma, coronary artery disease, and mild diabetes, hypertension, renal artery stenosis now being admitted  with:  #. Lower GI Bleed On  IV Protonix 40mg  BID and Nothing by mouth withIV fluid hydration - Patient has serologic Ab for which blood is being delivered from [place] in AM.  The patient has been offered a colonoscopy with a prep today and the colonoscopy tomorrow but the patient states she would like to talk to her family prior to setting anything up per Dr. Allen Norris.  Anemia of chronic disease. Hb is stable so far.  #. Left leg DVT Per venous US: Positive nonocclusive LLE DVT.  Heparin would be contraindicated in this patient due to both active GI bleeding and history of massive GI hemorrhage.   I requested vascular surgery consult for possible IVC filter placement. Dr.  Trula Slade suspects that this is a subacute problem.  She is not a candidate for anticoagulation because of her GI bleed.  patient would like to speak with her family as well as Dr. Ovid Curd prior to making any definitive decisions.    #. History of CAD Discontinued Plavix, nitroglycerin prn.  #. History of hypertension Hold Lasix due to low side blood pressure.  #. History of depression - Continue BuSpar, Prozac  #. History of breast cancer - Continue Femara  Per Dr. Lutricia Feil, the patient is in comfort care at home. She had history of GI bleeding. Hemoglobin is stable this time. Left leg DVT is probably not new. She suggests no colonoscopy or IVC filter placement. If patient is stable, the patient may be discharged to home tomorrow.  I discussed with Dr. Allen Norris, Dr. Trula Slade and Dr. Lutricia Feil. All the records are reviewed and case discussed with Care Management/Social Worker.  Management plans discussed with the patient, family and they are in agreement.  CODE STATUS: DNR  TOTAL TIME TAKING CARE OF THIS PATIENT: 45 minutes.   More than 50% of the time was spent in counseling/coordination of care: YES  POSSIBLE D/C IN  1-2 DAYS, DEPENDING ON CLINICAL CONDITION.   Demetrios Loll M.D on 02/25/2017 at 2:34  PM  Between 7am to 6pm - Pager - [phone]  After 6pm go to [email or web address] - [facility]

## 2017-02-25 NOTE — Consult Note (Signed)
Lucilla Lame, MD [facility]  [address], [place], [place] [address] Phone: [phone] Fax : [phone]  Consultation  Referring Provider:     Dr. Ara Kussmaul Primary Care Physician:  Gareth Morgan, MD Primary Gastroenterologist:  Althia Forts         Reason for Consultation:     Rectal bleeding  Date of Admission:  02/24/2017 Date of Consultation:  02/25/2017         HPI:   Jeanette White is a 83 y.o. female who was admitted with a report of a history of metastatic colon cancer although the patient has a history of metastatic breast cancer. The patient states she has had bright red blood per rectum since yesterday. The patient was admitted in September with black stools and was seen by GI at that time but refused any intervention. The patient denies some lower abdominal pain but no fevers nausea vomiting or recent change in bowel habits. There is no report of any recent endoscopic procedures although the patient was seen by Dr. Candace Cruise back in 2015 for procedure. The patient now states that she was told back in December that she had for months to live.  Past Medical History:  Diagnosis Date  . Anginal pain (HCC)    STABLE  . Arthritis   . Asthma    CHRONIC OBSTRUCTIVE  . B12 deficiency   . Breast cancer ([place])    has metastisized  . Complication of anesthesia    SLOW TO WAKE UP  . Coronary artery disease   . Depression    MAJOR  . Diabetes mellitus without complication ([place])   . Edema    HANDS/FEET  . GERD (gastroesophageal reflux disease)   . HOH (hard of hearing)   . Hypertension   . Myocardial infarction ([place])   . Peripheral vascular disease ([place])   . Polyneuropathy   . Renal anomaly    ARTERY STENOSIS  . Rhinitis, allergic    CHRONIC  . Shoulder disorder    ROTATOR CUFF TEAR  LEFT  . Sleep apnea   . Spinal disorder    STENOSIS    Past Surgical History:  Procedure Laterality Date  . APPENDECTOMY    . CATARACT EXTRACTION W/PHACO Left 12/09/2015   Procedure: CATARACT EXTRACTION PHACO AND INTRAOCULAR LENS PLACEMENT (IOC);  Surgeon: Estill Cotta, MD;  Location: [facility] ORS;  Service: Ophthalmology;  Laterality: Left;  [place] 01:21 AP% 22.8 CDE 31.49 fluid pack lot # [id number]  . CHOLECYSTECTOMY    . CORONARY ANGIOPLASTY     STENT  . CORONARY ARTERY BYPASS GRAFT    . TOTAL KNEE ARTHROPLASTY Bilateral     Prior to Admission medications   Medication Sig Start Date End Date Taking? Authorizing Provider  acetaminophen (TYLENOL) 500 MG tablet Take 1,000 mg by mouth every 8 (eight) hours as needed for mild pain or moderate pain.    [provider]  atropine 1 % ophthalmic solution Place 2 drops under the tongue every hour as needed (secretions).    [provider]  busPIRone (BUSPAR) 5 MG tablet Take 5 mg by mouth 2 (two) times daily.    [provider]  clopidogrel (PLAVIX) 75 MG tablet Take 75 mg by mouth daily.    [provider]  FLUoxetine (PROZAC) 40 MG capsule Take 40 mg by mouth daily.    [provider]  furosemide (LASIX) 20 MG tablet Take 1 tablet (20 mg total) by mouth daily. 09/24/16   Posey Pronto,  Sona, MD  ipratropium-albuterol (DUONEB) 0.5-2.5 (3) MG/3ML SOLN Take 3 mLs by nebulization every 6 (six) hours. 05/11/16   Loletha Grayer, MD  letrozole [facility]) 2.5 MG tablet Take 2.5 mg by mouth daily.    [provider]  lidocaine (LIDODERM) 5 % Place 1 patch onto the skin daily. Remove & Discard patch within 12 hours or as directed by MD    [provider]  loperamide (IMODIUM) 2 MG capsule Take 4 mg by mouth as needed for diarrhea or loose stools.    [provider]  LORazepam (ATIVAN) 2 MG/ML concentrated solution Take 0.5 mg by mouth every 2 (two) hours as needed for anxiety or sedation.    [provider]  meloxicam (MOBIC) 15 MG tablet Take 7.5 mg by mouth daily.    [provider]  Menthol-Zinc Oxide (CALMOSEPTINE) 0.44-20.625 % OINT Apply 1  application topically as needed.    [provider]  morphine (ROXANOL) 20 MG/ML concentrated solution Take 4 mg by mouth every 2 (two) hours as needed for severe pain.    [provider]  nitroGLYCERIN (NITROSTAT) 0.4 MG SL tablet Place 0.4 mg under the tongue every 5 (five) minutes as needed for chest pain.    [provider]  nystatin (MYCOSTATIN/NYSTOP) powder Apply topically 3 (three) times daily. 05/11/16   Loletha Grayer, MD  ondansetron (ZOFRAN-ODT) 4 MG disintegrating tablet Take 4 mg by mouth every 8 (eight) hours as needed for nausea or vomiting.    [provider]  pantoprazole (PROTONIX) 40 MG tablet Take 1 tablet (40 mg total) by mouth 2 (two) times daily. 01/17/17   Max Sane, MD  potassium chloride (K-DUR) 10 MEQ tablet Take 10-20 mEq by mouth 2 (two) times daily. 2 tablets in the morning and 1 tablet at bedtime    [provider]  pregabalin (LYRICA) 150 MG capsule Take 150 mg by mouth at bedtime.    [provider]  traMADol (ULTRAM) 50 MG tablet Take 50 mg by mouth every 6 (six) hours as needed.    [provider]    Family History  Problem Relation Age of Onset  . Alzheimer's disease Sister   . Arthritis Sister      Social History  Substance Use Topics  . Smoking status: Never Smoker  . Smokeless tobacco: Never Used  . Alcohol use No    Allergies as of 02/24/2017 - Review Complete 02/24/2017  Allergen Reaction Noted  . Ace inhibitors  12/01/2015  . Sulfa antibiotics  12/01/2015    Review of Systems:    All systems reviewed and negative except where noted in HPI.   Physical Exam:  Vital signs in last 24 hours: Temp:  [97.5 F (36.4 C)-97.9 F (36.6 C)] 97.5 F (36.4 C) (10/13 0448) Pulse Rate:  [62-76] 62 (10/13 0448) Resp:  [16-20] 17 (10/13 0448) BP: (100-182)/(58-113) 100/58 (10/13 0448) SpO2:  [94 %-99 %] 95 % (10/13 0448) Weight:  [200 lb (90.7 kg)-210 lb 12.2 oz (95.6 kg)] 210 lb 12.2  oz (95.6 kg) (10/13 0144)   General:   Pleasant, cooperative in NAD Head:  Normocephalic and atraumatic. Eyes:   No icterus.   Conjunctiva pink. PERRLA. Ears:  Normal auditory acuity. Neck:  Supple; no masses or thyroidomegaly Lungs: Respirations even and unlabored. Lungs clear to auscultation bilaterally.   No wheezes, crackles, or rhonchi.  Heart:  Regular rate and rhythm;  Without murmur, clicks, rubs or gallops Abdomen:  Soft, nondistended, nontender. Normal  bowel sounds. No appreciable masses or hepatomegaly.  No rebound or guarding.  Rectal:  Not performed. Msk:  Symmetrical without gross deformities.   Extremities:  Without edema, cyanosis or clubbing. Neurologic:  Alert and oriented x3;  grossly normal neurologically. Skin:  Intact without significant lesions or rashes. Cervical Nodes:  No significant cervical adenopathy. Psych:  Alert and cooperative. Normal affect.  LAB RESULTS:  Recent Labs  02/24/17 1701 02/25/17 0027 02/25/17 0507  WBC 4.7 4.4 4.4  HGB 8.7* 8.6* 7.7*  HCT 27.1* 26.8* 24.1*  PLT 317 278 271   BMET  Recent Labs  02/24/17 1701 02/25/17 0507  NA 140 143  K 4.0 3.5  CL 101 105  CO2 30 30  GLUCOSE 103* 97  BUN 16 10  CREATININE 0.61 0.58  CALCIUM 8.9 8.3*   LFT  Recent Labs  02/24/17 1701  PROT 6.8  ALBUMIN 3.3*  AST 16  ALT 8*  ALKPHOS 97  BILITOT 0.3   PT/INR No results for input(s): LABPROT, INR in the last 72 hours.  STUDIES: Ct Abdomen Pelvis W Contrast  Result Date: 02/24/2017 CLINICAL DATA:  83 year old female with lower abdominal pain, bright red blood per rectum, fatigue. EXAM: CT ABDOMEN AND PELVIS WITH CONTRAST TECHNIQUE: Multidetector CT imaging of the abdomen and pelvis was performed using the standard protocol following bolus administration of intravenous contrast. CONTRAST:  155mL ISOVUE-300 IOPAMIDOL (ISOVUE-300) INJECTION 61% COMPARISON:  09/24/2016 and earlier, including chest CT 05/07/2016. FINDINGS: Lower  chest: Chronic cardiomegaly. No pericardial effusion. No definite pleural effusion. Stable lung bases including scattered small and indistinct pulmonary and subpleural nodules. Rounded right breast mass measuring 2.2 cm on series 2, image 5. This is regressed compared to 05/07/2016. Hepatobiliary: Surgically absent gallbladder. Stable to decreased intra and extrahepatic biliary ducts size since May. Pancreas: Stable pancreatic atrophy. Spleen: Negative. Adrenals/Urinary Tract: Normal adrenal glands. Stable kidneys. Bilateral renal enhancement and contrast excretion is symmetric and within normal limits. Distended but otherwise unremarkable urinary bladder. Stomach/Bowel: Redundant sigmoid and transverse colon with retained stool throughout the mid and distal colon. Oral contrast has reached the hepatic flexure. Negative terminal ileum. No dilated small bowel. Negative stomach and duodenum. No abdominal free fluid. Vascular/Lymphatic: Aortoiliac calcified atherosclerosis. Major arterial structures are patent. Portal venous system is patent. Stable mildly increased in number retroperitoneal lymph nodes. Stable maximal pelvic sidewall nodes. At the common femoral veins there may be venous inflow artifact, the common femoral vein thrombus cannot be excluded (peptic early on the right series 2, image 72). Reproductive: Not identified and presumed surgically absent. Other: No pelvic free fluid. Postoperative changes to the ventral lower abdominal wall are stable. Musculoskeletal: Levoconvex lumbar spine curvature. Stable visualized osseous structures including mixed lytic and sclerotic bone lesions, such as a lytic lesion in the T12 lamina and spinous process (sagittal image 59). Prominent lytic lesion also in the left medial iliac bone and proximal right femur. No pathologic fracture identified. IMPRESSION: 1. Difficult to exclude common femoral vein DVT (series 2, image 72), although probably reflecting venous inflow  artifact. Recommend Bilateral Lower Extremity Doppler evaluation. 2. Evidence of widespread osseous metastatic disease appears stable since 09/24/2016 (please see that report). 3. Rounded right breast mass measuring 2.2 cm has regressed since 05/07/2016 (please see that report). 4. No new abnormality identified in the abdomen or pelvis. 5.  Aortic Atherosclerosis (ICD10-I70.0). Electronically Signed   By: Genevie Ann M.D.   On: 02/24/2017 21:01   US Venous Img Lower Bilateral  Result Date:  02/25/2017 CLINICAL DATA:  Evaluate for possible DVT, questioned on prior CT of the abdomen and pelvis. EXAM: BILATERAL LOWER EXTREMITY VENOUS DOPPLER ULTRASOUND TECHNIQUE: Gray-scale sonography with graded compression, as well as color Doppler and duplex ultrasound were performed to evaluate the lower extremity deep venous systems from the level of the common femoral vein and including the common femoral, femoral, profunda femoral, popliteal and calf veins including the posterior tibial, peroneal and gastrocnemius veins when visible. The superficial great saphenous vein was also interrogated. Spectral Doppler was utilized to evaluate flow at rest and with distal augmentation maneuvers in the common femoral, femoral and popliteal veins. COMPARISON:  Prior CT from earlier the same day. FINDINGS: RIGHT LOWER EXTREMITY Common Femoral Vein: No evidence of thrombus. Normal compressibility, respiratory phasicity and response to augmentation. Saphenofemoral Junction: No evidence of thrombus. Normal compressibility and flow on color Doppler imaging. Profunda Femoral Vein: No evidence of thrombus. Normal compressibility and flow on color Doppler imaging. Femoral Vein: Nonocclusive echogenic thrombus within the mid and distal left femoral vein. Popliteal Vein: No evidence of thrombus. Normal compressibility, respiratory phasicity and response to augmentation. Calf Veins: Veins of the calf are poorly visualized on this exam, and not well  assessed. Superficial Great Saphenous Vein: No evidence of thrombus. Normal compressibility. Venous Reflux:  None. Other Findings:  None. LEFT LOWER EXTREMITY Common Femoral Vein: No evidence of thrombus. Normal compressibility, respiratory phasicity and response to augmentation. Saphenofemoral Junction: No evidence of thrombus. Normal compressibility and flow on color Doppler imaging. Profunda Femoral Vein: No evidence of thrombus. Normal compressibility and flow on color Doppler imaging. Femoral Vein: No evidence of thrombus. Normal compressibility, respiratory phasicity and response to augmentation. Popliteal Vein: No evidence of thrombus. Normal compressibility, respiratory phasicity and response to augmentation. Calf Veins: Veins of the calf are poorly visualized on this exam, and not well assessed. Superficial Great Saphenous Vein: No evidence of thrombus. Normal compressibility. Venous Reflux:  None. Other Findings:  None. IMPRESSION: 1. Positive study with nonocclusive DVT in the mid and distal left femoral vein. 2. Poor visualization of the veins of the calf bilaterally, not well assessed on this exam. Electronically Signed   By: Jeannine Boga M.D.   On: 02/25/2017 00:26      Impression / Plan:   Yoseline Andersson is a 83 y.o. y/o female with Metastatic breast cancer who was admitted with rectal bleeding. The patient states she had a colonoscopy although I do not have those records there was encounter recorded with Dr. Candace Cruise back in 2015. The patient does have a history of melena back in September and refused any intervention at that time. The patient has been offered a colonoscopy with a prep today and the colonoscopy tomorrow but the patient states she would like to talk to her family prior to setting anything up. The patient has been told to tell the nurse if she decides to go through with the colonoscopy will contact me and this will be set up.   Thank you for involving me in the care of this  patient.      LOS: 1 day   Lucilla Lame, MD  02/25/2017, 10:22 AM   Note: This dictation was prepared with Dragon dictation along with smaller phrase technology. Any transcriptional errors that result from this process are unintentional.

## 2017-02-26 NOTE — Care Management Note (Signed)
Case Management Note  Patient Details  Name: Kaydince Towles MRN: [id number] Date of Birth: June 20, 1927  Subjective/Objective:       Received a call-back from [facility] who report that they do not transport on Sunday. [facility] suggested that [facility] call non-emergency transport to transport Ms Fisher home today.              Action/Plan:   Expected Discharge Date:  02/26/17               Expected Discharge Plan:     In-House Referral:     Discharge planning Services     Post Acute Care Choice:    Choice offered to:     DME Arranged:    DME Agency:     HH Arranged:    HH Agency:     Status of Service:     If discussed at [employer], dates discussed:    Additional Comments:  Drey Shaff A, RN 02/26/2017, 12:20 PM

## 2017-02-26 NOTE — Progress Notes (Signed)
EMS had previously been called to arrange for non emergent transport since [facility] does not provide transportation on Sunday. Dispatcher on the phone asked if patient had any equipment and was told by this RN that the patient had her own personal wheelchair with her. EMS arrived to transport patient but stated that they could not transport her wheelchair due to safety issues. Patient was asked if there was anyone that could be called to transport the wheelchair for her and she stated that there was not and that she could not go home if she does not have her wheelchair. Dr. Bridgett Larsson was paged and notified of this and gave permission for patient to stay overnight. Jeani Hawking, case manager was notified of this as well.

## 2017-02-26 NOTE — Care Management Note (Signed)
Case Management Note  Patient Details  Name: Jeanette White MRN: [id number] Date of Birth: 1928/03/20  Subjective/Objective:     [name] is wheelchair bound. Awaiting call-back from [facility] regarding transport home today.                Action/Plan:   Expected Discharge Date:  02/26/17               Expected Discharge Plan:     In-House Referral:     Discharge planning Services     Post Acute Care Choice:    Choice offered to:     DME Arranged:    DME Agency:     HH Arranged:    HH Agency:     Status of Service:     If discussed at [employer], dates discussed:    Additional Comments:  Nettie Cromwell A, RN 02/26/2017, 11:21 AM

## 2017-02-26 NOTE — Discharge Instructions (Signed)
Follow up [facility] °

## 2017-02-26 NOTE — Discharge Summary (Signed)
[facility] [place] NAME: Jeanette White    MR#:  [id number]  DATE OF BIRTH:  1927-11-25  DATE OF ADMISSION:  02/24/2017   ADMITTING PHYSICIAN: Harvie Bridge, DO  DATE OF DISCHARGE:  02/26/2017  PRIMARY CARE PHYSICIAN: Gareth Morgan, MD   ADMISSION DIAGNOSIS:  Swelling [R60.9] Gastrointestinal hemorrhage, unspecified gastrointestinal hemorrhage type [K92.2] DISCHARGE DIAGNOSIS:  Active Problems:   Near syncope   Gastrointestinal hemorrhage   Swelling  SECONDARY DIAGNOSIS:   Past Medical History:  Diagnosis Date  . Anginal pain (HCC)    STABLE  . Arthritis   . Asthma    CHRONIC OBSTRUCTIVE  . B12 deficiency   . Breast cancer ([place])    has metastisized  . Complication of anesthesia    SLOW TO WAKE UP  . Coronary artery disease   . Depression    MAJOR  . Diabetes mellitus without complication ([place])   . Edema    HANDS/FEET  . GERD (gastroesophageal reflux disease)   . HOH (hard of hearing)   . Hypertension   . Myocardial infarction ([place])   . Peripheral vascular disease ([place])   . Polyneuropathy   . Renal anomaly    ARTERY STENOSIS  . Rhinitis, allergic    CHRONIC  . Shoulder disorder    ROTATOR CUFF TEAR  LEFT  . Sleep apnea   . Spinal disorder    STENOSIS   HOSPITAL COURSE:   This is a 83 y.o.femalewith a history of Metastatic colon cancer, Asthma, coronary artery disease, and mild diabetes, hypertension, renal artery stenosisnow being admitted with:  #. Lower GI Bleed She was on  IV Protonix 40mg  BID and Nothing by mouth withIV fluid hydration - Patient has serologic Ab for which blood is being delivered from [place] in AM.  The patient has been offered a colonoscopy with a prep today and the colonoscopy tomorrow but the patient states she would like to talk to her family prior to setting anything up per Dr. Allen Norris.  Anemia of chronic disease. Hb is stable so far.  #. Left leg DVT Per venous  [id number] nonocclusive LLE DVT. Heparin would be contraindicated in this patient due to both active GI bleeding and history of massive GI hemorrhage.  I requested vascular surgery consult for possible IVC filter placement. Dr.  Trula Slade suspects that this is a subacute problem. She is not a candidate for anticoagulation because of her GI bleed. patient would like to speak with her family as well as Dr. Ovid Curd prior to making any definitive decisions.   #. History of CAD Discontinued Plavix, nitroglycerin prn.  #.History of hypertension Hold Lasix due to low side blood pressure.  #. History of depression - Continue BuSpar, Prozac  #. History of breast cancer - Continue Femara  Per Dr. Lutricia Feil, the patient is in comfort care at home. She had history of GI bleeding. Hemoglobin is stable this time. Left leg DVT is probably not new. She suggests no colonoscopy or IVC filter placement.  I discussed with Dr. Allen Norris. DISCHARGE CONDITIONS:  Stable, discharge to home and resume HH with [facility] today. CONSULTS OBTAINED:  Treatment Team:  Serafina Mitchell, MD Schnier, Dolores Lory, MD DRUG ALLERGIES:   Allergies  Allergen Reactions  . Ace Inhibitors   . Sulfa Antibiotics    DISCHARGE MEDICATIONS:   Allergies as of 02/26/2017      Reactions   Ace Inhibitors    Sulfa Antibiotics  Medication List    TAKE these medications   acetaminophen 500 MG tablet Commonly known as:  TYLENOL Take 1,000 mg by mouth every 8 (eight) hours as needed for mild pain or moderate pain.   atropine 1 % ophthalmic solution Place 2 drops under the tongue every hour as needed (secretions).   busPIRone 5 MG tablet Commonly known as:  BUSPAR Take 5 mg by mouth 2 (two) times daily.   CALMOSEPTINE 0.44-20.625 % Oint Generic drug:  Menthol-Zinc Oxide Apply 1 application topically as needed.   FLUoxetine 40 MG capsule Commonly known as:  PROZAC Take 40 mg by mouth daily.   furosemide 20 MG  tablet Commonly known as:  LASIX Take 1 tablet (20 mg total) by mouth daily.   hydrocortisone cream 1 % Apply 1 application topically 3 (three) times daily as needed for itching.   ipratropium-albuterol 0.5-2.5 (3) MG/3ML Soln Commonly known as:  DUONEB Take 3 mLs by nebulization every 6 (six) hours.   letrozole 2.5 MG tablet Commonly known as:  FEMARA Take 2.5 mg by mouth daily.   lidocaine 5 % Commonly known as:  LIDODERM Place 1 patch onto the skin daily. Remove & Discard patch within 12 hours or as directed by MD   loperamide 2 MG capsule Commonly known as:  IMODIUM Take 4 mg by mouth as needed for diarrhea or loose stools.   LORazepam 2 MG/ML concentrated solution Commonly known as:  ATIVAN Take 0.5 mg by mouth every 2 (two) hours as needed for anxiety or sedation.   morphine 20 MG/ML concentrated solution Commonly known as:  ROXANOL Take 4 mg by mouth every 2 (two) hours as needed for severe pain.   nitroGLYCERIN 0.4 MG SL tablet Commonly known as:  NITROSTAT Place 0.4 mg under the tongue every 5 (five) minutes as needed for chest pain.   nystatin powder Commonly known as:  MYCOSTATIN/NYSTOP Apply topically 3 (three) times daily.   ondansetron 4 MG disintegrating tablet Commonly known as:  ZOFRAN-ODT Take 4 mg by mouth every 8 (eight) hours as needed for nausea or vomiting.   pantoprazole 40 MG tablet Commonly known as:  PROTONIX Take 1 tablet (40 mg total) by mouth 2 (two) times daily.   potassium chloride 10 MEQ tablet Commonly known as:  K-DUR Take 10-20 mEq by mouth 2 (two) times daily. 2 tablets in the morning and 1 tablet at bedtime   pregabalin 150 MG capsule Commonly known as:  LYRICA Take 150 mg by mouth at bedtime.   traMADol 50 MG tablet Commonly known as:  ULTRAM Take 50 mg by mouth every 6 (six) hours as needed.        DISCHARGE INSTRUCTIONS:  See AVS.  If you experience worsening of your admission symptoms, develop shortness of  breath, life threatening emergency, suicidal or homicidal thoughts you must seek medical attention immediately by calling 911 or calling your MD immediately  if symptoms less severe.  You Must read complete instructions/literature along with all the possible adverse reactions/side effects for all the Medicines you take and that have been prescribed to you. Take any new Medicines after you have completely understood and accpet all the possible adverse reactions/side effects.   Please note  You were cared for by a hospitalist during your hospital stay. If you have any questions about your discharge medications or the care you received while you were in the hospital after you are discharged, you can call the unit and asked to speak with the  hospitalist on call if the hospitalist that took care of you is not available. Once you are discharged, your primary care physician will handle any further medical issues. Please note that NO REFILLS for any discharge medications will be authorized once you are discharged, as it is imperative that you return to your primary care physician (or establish a relationship with a primary care physician if you do not have one) for your aftercare needs so that they can reassess your need for medications and monitor your lab values.    On the day of Discharge:  VITAL SIGNS:  Blood pressure (!) 130/54, pulse (!) 58, temperature 98.3 F (36.8 C), temperature source Oral, resp. rate 18, height 5\' 1"  (1.549 m), weight 210 lb 12.2 oz (95.6 kg), SpO2 96 %. PHYSICAL EXAMINATION:  GENERAL:  83 y.o.-year-old patient lying in the bed with no acute distress. Obesity. EYES: Pupils equal, round, reactive to light and accommodation. No scleral icterus. Extraocular muscles intact.  HEENT: Head atraumatic, normocephalic. Oropharynx and nasopharynx clear.  NECK:  Supple, no jugular venous distention. No thyroid enlargement, no tenderness.  LUNGS: Normal breath sounds bilaterally, no  wheezing, rales,rhonchi or crepitation. No use of accessory muscles of respiration.  CARDIOVASCULAR: S1, S2 normal. No murmurs, rubs, or gallops.  ABDOMEN: Soft, non-tender, non-distended. Bowel sounds present. No organomegaly or mass.  EXTREMITIES: No pedal edema, cyanosis, or clubbing.  NEUROLOGIC: Cranial nerves II through XII are intact. Muscle strength 3/5 in all extremities. Sensation intact. Gait not checked.  PSYCHIATRIC: The patient is alert and oriented x 3.  SKIN: No obvious rash, lesion, or ulcer.  DATA REVIEW:   CBC  Recent Labs Lab 02/25/17 0507 02/25/17 1214  WBC 4.4  --   HGB 7.7* 8.1*  HCT 24.1*  --   PLT 271  --     Chemistries   Recent Labs Lab 02/24/17 1701 02/25/17 0507  NA 140 143  K 4.0 3.5  CL 101 105  CO2 30 30  GLUCOSE 103* 97  BUN 16 10  CREATININE 0.61 0.58  CALCIUM 8.9 8.3*  AST 16  --   ALT 8*  --   ALKPHOS 97  --   BILITOT 0.3  --      Microbiology Results  Results for orders placed or performed during the hospital encounter of 09/21/16  Urine culture     Status: Abnormal   Collection Time: 09/21/16  9:47 PM  Result Value Ref Range Status   Specimen Description URINE, RANDOM  Final   Special Requests NONE  Final   Culture >=100,000 COLONIES/mL PROTEUS MIRABILIS (A)  Final   Report Status 09/24/2016 FINAL  Final   Organism ID, Bacteria PROTEUS MIRABILIS (A)  Final      Susceptibility   Proteus mirabilis - MIC*    AMPICILLIN <=2 SENSITIVE Sensitive     CEFAZOLIN <=4 SENSITIVE Sensitive     CEFTRIAXONE <=1 SENSITIVE Sensitive     CIPROFLOXACIN <=0.25 SENSITIVE Sensitive     GENTAMICIN <=1 SENSITIVE Sensitive     IMIPENEM 2 SENSITIVE Sensitive     NITROFURANTOIN 128 RESISTANT Resistant     TRIMETH/SULFA <=20 SENSITIVE Sensitive     AMPICILLIN/SULBACTAM <=2 SENSITIVE Sensitive     PIP/TAZO <=4 SENSITIVE Sensitive     * >=100,000 COLONIES/mL PROTEUS MIRABILIS    RADIOLOGY:  No results found.   Management plans discussed  with the patient, family and they are in agreement.  CODE STATUS: DNR   TOTAL TIME TAKING CARE OF THIS  PATIENT: 32 minutes.    Demetrios Loll M.D on 02/26/2017 at 11:41 AM  Between 7am to 6pm - Pager - [phone]  After 6pm go to [email or web address] - Proofreader  [facility]  Office  [phone]  CC: Primary care physician; Gareth Morgan, MD   Note: This dictation was prepared with Dragon dictation along with smaller phrase technology. Any transcriptional errors that result from this process are unintentional.

## 2017-02-27 NOTE — Progress Notes (Signed)
Pt discharged per MD order. Pt discharged with paperwork and DNR paper. Pt transported via [facility].

## 2017-02-27 NOTE — Care Management Important Message (Signed)
Important Message  Patient Details  Name: Jeanette White MRN: [id number] Date of Birth: 1927-10-12   Medicare Important Message Given:  Yes    Beverly Sessions, RN 02/27/2017, 12:54 PM

## 2017-02-27 NOTE — Care Management (Signed)
Patient discharged today.  [facility] notified of discharge.  [facility] transported at discharge.

## 2017-02-27 NOTE — Discharge Summary (Addendum)
[facility] [place] NAME: Jeanette White    MR#:  [id number]  DATE OF BIRTH:  May 19, 1927  DATE OF ADMISSION:  02/24/2017 ADMITTING PHYSICIAN: Harvie Bridge, DO  DATE OF DISCHARGE: 02/27/17  PRIMARY CARE PHYSICIAN: Gareth Morgan, MD    ADMISSION DIAGNOSIS:  Swelling [R60.9] Gastrointestinal hemorrhage, unspecified gastrointestinal hemorrhage type [K92.2]  DISCHARGE DIAGNOSIS:  Rectal bleed--resolved--medical mnx Chronic DVT left leg H/o breast cancer SECONDARY DIAGNOSIS:   Past Medical History:  Diagnosis Date  . Anginal pain (HCC)    STABLE  . Arthritis   . Asthma    CHRONIC OBSTRUCTIVE  . B12 deficiency   . Breast cancer ([place])    has metastisized  . Complication of anesthesia    SLOW TO WAKE UP  . Coronary artery disease   . Depression    MAJOR  . Diabetes mellitus without complication ([place])   . Edema    HANDS/FEET  . GERD (gastroesophageal reflux disease)   . HOH (hard of hearing)   . Hypertension   . Myocardial infarction ([place])   . Peripheral vascular disease ([place])   . Polyneuropathy   . Renal anomaly    ARTERY STENOSIS  . Rhinitis, allergic    CHRONIC  . Shoulder disorder    ROTATOR CUFF TEAR  LEFT  . Sleep apnea   . Spinal disorder    STENOSIS    HOSPITAL COURSE:   83 y.o. female with a history of Metastatic colon cancer, Asthma, coronary artery disease, and mild diabetes, hypertension, renal artery stenosis now being admitted with:  #. Lower GI Bleed - Hold anticoagulants - GI consultation By Dr. Allen Norris noted. Patient not actively bleeding. Hemoglobin is stable. She is not wanting any further GI workup done at present.  #. Chronic left femoral vein DVT - Positive nonocclusive LLE DVT.  Heparin would be contraindicated in this patient due to both active GI bleeding and history of massive GI hemorrhage.   -Patient was seen by vascular Dr. Trula Slade --- IVC filter was recommended however  decision was made not to place in IVC filter due to patient being on comfort measures and after discussing with her [facility] M.D. Deon Pilling #. History of CAD Continue nitroglycerin  #. History of hypertension Continue Lasix  #. History of depression - Continue BuSpar, Prozac  #. History of breast cancer - Continue Femara  Patient is bedbound She will be discharged to home by [facility] program Left message for Dr. Ovid Curd to call me back.  Spoke with Georga Kaufmann NP and ok with d/c to home CONSULTS OBTAINED:  Treatment Team:  Katha Cabal, MD  DRUG ALLERGIES:   Allergies  Allergen Reactions  . Ace Inhibitors   . Sulfa Antibiotics     DISCHARGE MEDICATIONS:   Current Discharge Medication List    CONTINUE these medications which have NOT CHANGED   Details  acetaminophen (TYLENOL) 500 MG tablet Take 1,000 mg by mouth every 8 (eight) hours as needed for mild pain or moderate pain.    atropine 1 % ophthalmic solution Place 2 drops under the tongue every hour as needed (secretions).    busPIRone (BUSPAR) 5 MG tablet Take 5 mg by mouth 2 (two) times daily.    FLUoxetine (PROZAC) 40 MG capsule Take 40 mg by mouth daily.    furosemide (LASIX) 20 MG tablet Take 1 tablet (20 mg total) by mouth daily. Qty: 30 tablet, Refills: 0    hydrocortisone cream 1 %  Apply 1 application topically 3 (three) times daily as needed for itching.    ipratropium-albuterol (DUONEB) 0.5-2.5 (3) MG/3ML SOLN Take 3 mLs by nebulization every 6 (six) hours. Qty: 360 mL, Refills: 0    letrozole (FEMARA) 2.5 MG tablet Take 2.5 mg by mouth daily.    lidocaine (LIDODERM) 5 % Place 1 patch onto the skin daily. Remove & Discard patch within 12 hours or as directed by MD    loperamide (IMODIUM) 2 MG capsule Take 4 mg by mouth as needed for diarrhea or loose stools.    LORazepam (ATIVAN) 2 MG/ML concentrated solution Take 0.5 mg by mouth every 2 (two) hours as needed for anxiety or sedation.     Menthol-Zinc Oxide (CALMOSEPTINE) 0.44-20.625 % OINT Apply 1 application topically as needed.    morphine (ROXANOL) 20 MG/ML concentrated solution Take 4 mg by mouth every 2 (two) hours as needed for severe pain.    nitroGLYCERIN (NITROSTAT) 0.4 MG SL tablet Place 0.4 mg under the tongue every 5 (five) minutes as needed for chest pain.    nystatin (MYCOSTATIN/NYSTOP) powder Apply topically 3 (three) times daily. Qty: 15 g, Refills: 0    ondansetron (ZOFRAN-ODT) 4 MG disintegrating tablet Take 4 mg by mouth every 8 (eight) hours as needed for nausea or vomiting.    pantoprazole (PROTONIX) 40 MG tablet Take 1 tablet (40 mg total) by mouth 2 (two) times daily. Qty: 60 tablet, Refills: 0    potassium chloride (K-DUR) 10 MEQ tablet Take 10-20 mEq by mouth 2 (two) times daily. 2 tablets in the morning and 1 tablet at bedtime    pregabalin (LYRICA) 150 MG capsule Take 150 mg by mouth at bedtime.    traMADol (ULTRAM) 50 MG tablet Take 50 mg by mouth every 6 (six) hours as needed.        If you experience worsening of your admission symptoms, develop shortness of breath, life threatening emergency, suicidal or homicidal thoughts you must seek medical attention immediately by calling 911 or calling your MD immediately  if symptoms less severe.  You Must read complete instructions/literature along with all the possible adverse reactions/side effects for all the Medicines you take and that have been prescribed to you. Take any new Medicines after you have completely understood and accept all the possible adverse reactions/side effects.   Please note  You were cared for by a hospitalist during your hospital stay. If you have any questions about your discharge medications or the care you received while you were in the hospital after you are discharged, you can call the unit and asked to speak with the hospitalist on call if the hospitalist that took care of you is not available. Once you are  discharged, your primary care physician will handle any further medical issues. Please note that NO REFILLS for any discharge medications will be authorized once you are discharged, as it is imperative that you return to your primary care physician (or establish a relationship with a primary care physician if you do not have one) for your aftercare needs so that they can reassess your need for medications and monitor your lab values. Today   SUBJECTIVE   No complaints. Patient requesting to go home.  VITAL SIGNS:  Blood pressure (!) 126/53, pulse (!) 58, temperature (!) 97.4 F (36.3 C), temperature source Oral, resp. rate 18, height 5\' 1"  (1.549 m), weight 95.6 kg (210 lb 12.2 oz), SpO2 97 %.  I/O:   Intake/Output Summary (Last 24 hours)  at 02/27/17 1026 Last data filed at 02/27/17 1012  Gross per 24 hour  Intake             1200 ml  Output              850 ml  Net              350 ml    PHYSICAL EXAMINATION:  GENERAL:  83 y.o.-year-old patient lying in the bed with no acute distress.Obese  EYES: Pupils equal, round, reactive to light and accommodation. No scleral icterus. Extraocular muscles intact.  HEENT: Head atraumatic, normocephalic. Oropharynx and nasopharynx clear.  NECK:  Supple, no jugular venous distention. No thyroid enlargement, no tenderness.  LUNGS: Normal breath sounds bilaterally, no wheezing, rales,rhonchi or crepitation. No use of accessory muscles of respiration.  CARDIOVASCULAR: S1, S2 normal. No murmurs, rubs, or gallops.  ABDOMEN: Soft, non-tender, non-distended. Bowel sounds present. No organomegaly or mass.  EXTREMITIES: No pedal edema, cyanosis, or clubbing.  NEUROLOGIC: Cranial nerves II through XII are intact. Muscle strength 5/5 in all extremities. Sensation intact. Gait not checked.  PSYCHIATRIC: The patient is alert and oriented x 3.  SKIN: No obvious rash, lesion, or ulcer.   DATA REVIEW:   CBC   Recent Labs Lab 02/25/17 0507 02/25/17 1214   WBC 4.4  --   HGB 7.7* 8.1*  HCT 24.1*  --   PLT 271  --     Chemistries   Recent Labs Lab 02/24/17 1701 02/25/17 0507  NA 140 143  K 4.0 3.5  CL 101 105  CO2 30 30  GLUCOSE 103* 97  BUN 16 10  CREATININE 0.61 0.58  CALCIUM 8.9 8.3*  AST 16  --   ALT 8*  --   ALKPHOS 97  --   BILITOT 0.3  --     Microbiology Results   No results found for this or any previous visit (from the past 240 hour(s)).  RADIOLOGY:  No results found.   Management plans discussed with the patient, family and they are in agreement.  CODE STATUS:     Code Status Orders        Start     Ordered   02/25/17 0238  Do not attempt resuscitation (DNR)  Continuous    Question Answer Comment  In the event of cardiac or respiratory ARREST Do not call a "code blue"   In the event of cardiac or respiratory ARREST Do not perform Intubation, CPR, defibrillation or ACLS   In the event of cardiac or respiratory ARREST Use medication by any route, position, wound care, and other measures to relive pain and suffering. May use oxygen, suction and manual treatment of airway obstruction as needed for comfort.      02/25/17 0237    Code Status History    Date Active Date Inactive Code Status Order ID Comments User Context   02/25/2017  1:20 AM 02/25/2017  2:37 AM Full Code [id number]  Hugelmeyer, [place], DO Inpatient   02/24/2017 10:05 PM 02/25/2017  1:20 AM DNR [id number]  Harvie Bridge, DO ED   01/13/2017  5:09 PM 01/17/2017  4:41 PM DNR [id number]  Nicholes Mango, MD Inpatient   09/22/2016  2:37 AM 09/22/2016  4:08 PM Full Code [id number]  Hugelmeyer, Ubaldo Glassing, DO Inpatient   06/18/2016  2:13 PM 06/19/2016  6:44 PM DNR [id number]  Vaughan Basta, MD Inpatient   05/07/2016  6:54 AM 05/11/2016  4:14 PM DNR [id number]  Saundra Shelling, MD Inpatient  Advance Directive Documentation     Most Recent Value  Type of Advance Directive  Out of facility DNR (pink MOST or yellow form)  Pre-existing out of facility  DNR order (yellow form or pink MOST form)  -  "MOST" Form in Place?  -      TOTAL TIME TAKING CARE OF THIS PATIENT: *40 minutes.    Teion Ballin M.D on 02/27/2017 at 10:26 AM  Between 7am to 6pm - Pager - [phone] After 6pm go to [email or web address] - password [email or web address] [facility]  Office  [phone]  CC: Primary care physician; Gareth Morgan, MD

## 2017-02-28 LAB — TYPE AND SCREEN
ABO/RH(D): O NEG
Antibody Screen: POSITIVE
UNIT DIVISION: 0
Unit division: 0

## 2017-02-28 LAB — BPAM RBC
BLOOD PRODUCT EXPIRATION DATE: 201811072359
Blood Product Expiration Date: 201811062359
Unit Type and Rh: 9500
Unit Type and Rh: 9500

## 2017-06-06 ENCOUNTER — Other Ambulatory Visit: Payer: Self-pay | Admitting: Family

## 2017-06-06 DIAGNOSIS — C50919 Malignant neoplasm of unspecified site of unspecified female breast: Secondary | ICD-10-CM

## 2017-06-06 DIAGNOSIS — R29898 Other symptoms and signs involving the musculoskeletal system: Secondary | ICD-10-CM

## 2017-06-15 ENCOUNTER — Ambulatory Visit
Admission: RE | Admit: 2017-06-15 | Discharge: 2017-06-15 | Disposition: A | Discharge status: Home / Self Care | Payer: Medicare (Managed Care) | Source: Ambulatory Visit | Attending: Family | Admitting: Family

## 2017-06-15 DIAGNOSIS — M1288 Other specific arthropathies, not elsewhere classified, other specified site: Secondary | ICD-10-CM | POA: Insufficient documentation

## 2017-06-15 DIAGNOSIS — C7951 Secondary malignant neoplasm of bone: Secondary | ICD-10-CM | POA: Diagnosis not present

## 2017-06-15 DIAGNOSIS — M5126 Other intervertebral disc displacement, lumbar region: Secondary | ICD-10-CM | POA: Diagnosis not present

## 2017-06-15 DIAGNOSIS — C50919 Malignant neoplasm of unspecified site of unspecified female breast: Secondary | ICD-10-CM | POA: Insufficient documentation

## 2017-06-15 DIAGNOSIS — R29898 Other symptoms and signs involving the musculoskeletal system: Secondary | ICD-10-CM | POA: Diagnosis not present

## 2017-06-15 DIAGNOSIS — M48061 Spinal stenosis, lumbar region without neurogenic claudication: Secondary | ICD-10-CM | POA: Diagnosis not present

## 2017-06-15 LAB — POCT I-STAT CREATININE: Creatinine, Ser: 0.6 mg/dL (ref 0.44–1.00)

## 2017-06-15 MED ORDER — GADOBENATE DIMEGLUMINE 529 MG/ML IV SOLN
20.0000 mL | Freq: Once | INTRAVENOUS | Status: AC | PRN
  Administered 2017-06-15: 20 mL via INTRAVENOUS

## 2019-04-16 DEATH — deceased
